# Patient Record
Sex: Male | Born: 1950
Health system: Southern US, Community
[De-identification: ages and names within clinical notes are randomized; demographics above are authoritative.]

## PROBLEM LIST (undated history)

## (undated) DIAGNOSIS — I10 Essential (primary) hypertension: Secondary | ICD-10-CM

## (undated) DIAGNOSIS — Z87442 Personal history of urinary calculi: Secondary | ICD-10-CM

## (undated) DIAGNOSIS — E119 Type 2 diabetes mellitus without complications: Secondary | ICD-10-CM

## (undated) DIAGNOSIS — K219 Gastro-esophageal reflux disease without esophagitis: Secondary | ICD-10-CM

## (undated) DIAGNOSIS — K8591 Acute pancreatitis with uninfected necrosis, unspecified: Secondary | ICD-10-CM

## (undated) DIAGNOSIS — B1920 Unspecified viral hepatitis C without hepatic coma: Secondary | ICD-10-CM

## (undated) HISTORY — DX: Acute pancreatitis with uninfected necrosis, unspecified: K85.91

## (undated) HISTORY — PX: BACK SURGERY: SHX140

---

## 1998-12-25 ENCOUNTER — Encounter: Payer: Self-pay | Admitting: Emergency Medicine

## 1998-12-25 ENCOUNTER — Emergency Department (HOSPITAL_COMMUNITY): Admission: EM | Admit: 1998-12-25 | Discharge: 1998-12-25 | Payer: Self-pay | Admitting: Emergency Medicine

## 1998-12-26 ENCOUNTER — Ambulatory Visit (HOSPITAL_COMMUNITY): Admission: RE | Admit: 1998-12-26 | Discharge: 1998-12-26 | Payer: Self-pay | Admitting: Pediatrics

## 2001-12-02 ENCOUNTER — Emergency Department (HOSPITAL_COMMUNITY): Admission: EM | Admit: 2001-12-02 | Discharge: 2001-12-02 | Payer: Self-pay

## 2016-11-02 DIAGNOSIS — R748 Abnormal levels of other serum enzymes: Secondary | ICD-10-CM | POA: Diagnosis not present

## 2016-11-02 DIAGNOSIS — D582 Other hemoglobinopathies: Secondary | ICD-10-CM | POA: Diagnosis not present

## 2016-11-02 DIAGNOSIS — F17210 Nicotine dependence, cigarettes, uncomplicated: Secondary | ICD-10-CM | POA: Diagnosis not present

## 2016-11-02 DIAGNOSIS — R7989 Other specified abnormal findings of blood chemistry: Secondary | ICD-10-CM | POA: Diagnosis not present

## 2016-11-02 DIAGNOSIS — R1030 Lower abdominal pain, unspecified: Secondary | ICD-10-CM | POA: Diagnosis not present

## 2016-11-02 DIAGNOSIS — R7309 Other abnormal glucose: Secondary | ICD-10-CM | POA: Diagnosis not present

## 2016-11-02 DIAGNOSIS — R197 Diarrhea, unspecified: Secondary | ICD-10-CM | POA: Diagnosis not present

## 2017-03-25 DIAGNOSIS — B3749 Other urogenital candidiasis: Secondary | ICD-10-CM | POA: Diagnosis not present

## 2017-03-25 DIAGNOSIS — B3742 Candidal balanitis: Secondary | ICD-10-CM | POA: Diagnosis not present

## 2017-03-25 DIAGNOSIS — R81 Glycosuria: Secondary | ICD-10-CM | POA: Diagnosis not present

## 2017-03-25 DIAGNOSIS — N481 Balanitis: Secondary | ICD-10-CM | POA: Diagnosis not present

## 2017-03-25 DIAGNOSIS — R7989 Other specified abnormal findings of blood chemistry: Secondary | ICD-10-CM | POA: Diagnosis not present

## 2017-04-02 DIAGNOSIS — E785 Hyperlipidemia, unspecified: Secondary | ICD-10-CM | POA: Diagnosis not present

## 2017-04-02 DIAGNOSIS — Z125 Encounter for screening for malignant neoplasm of prostate: Secondary | ICD-10-CM | POA: Diagnosis not present

## 2017-04-02 DIAGNOSIS — Z0001 Encounter for general adult medical examination with abnormal findings: Secondary | ICD-10-CM | POA: Diagnosis not present

## 2017-04-02 DIAGNOSIS — R739 Hyperglycemia, unspecified: Secondary | ICD-10-CM | POA: Diagnosis not present

## 2017-04-02 DIAGNOSIS — M545 Low back pain: Secondary | ICD-10-CM | POA: Diagnosis not present

## 2017-04-02 DIAGNOSIS — E109 Type 1 diabetes mellitus without complications: Secondary | ICD-10-CM | POA: Diagnosis not present

## 2017-04-16 DIAGNOSIS — E109 Type 1 diabetes mellitus without complications: Secondary | ICD-10-CM | POA: Diagnosis not present

## 2017-05-18 DIAGNOSIS — E785 Hyperlipidemia, unspecified: Secondary | ICD-10-CM | POA: Diagnosis not present

## 2017-05-18 DIAGNOSIS — E109 Type 1 diabetes mellitus without complications: Secondary | ICD-10-CM | POA: Diagnosis not present

## 2017-08-18 DIAGNOSIS — N200 Calculus of kidney: Secondary | ICD-10-CM | POA: Diagnosis not present

## 2017-08-18 DIAGNOSIS — E785 Hyperlipidemia, unspecified: Secondary | ICD-10-CM | POA: Diagnosis not present

## 2017-08-18 DIAGNOSIS — E109 Type 1 diabetes mellitus without complications: Secondary | ICD-10-CM | POA: Diagnosis not present

## 2017-11-16 DIAGNOSIS — G4700 Insomnia, unspecified: Secondary | ICD-10-CM | POA: Diagnosis not present

## 2017-11-16 DIAGNOSIS — E139 Other specified diabetes mellitus without complications: Secondary | ICD-10-CM | POA: Diagnosis not present

## 2018-01-20 DIAGNOSIS — Z720 Tobacco use: Secondary | ICD-10-CM

## 2018-01-20 DIAGNOSIS — R079 Chest pain, unspecified: Secondary | ICD-10-CM

## 2018-01-20 DIAGNOSIS — F17210 Nicotine dependence, cigarettes, uncomplicated: Secondary | ICD-10-CM | POA: Diagnosis not present

## 2018-01-20 DIAGNOSIS — E119 Type 2 diabetes mellitus without complications: Secondary | ICD-10-CM

## 2018-01-20 DIAGNOSIS — R0789 Other chest pain: Secondary | ICD-10-CM

## 2018-01-21 DIAGNOSIS — R0789 Other chest pain: Secondary | ICD-10-CM

## 2018-01-21 DIAGNOSIS — Z720 Tobacco use: Secondary | ICD-10-CM | POA: Diagnosis not present

## 2018-01-21 DIAGNOSIS — E119 Type 2 diabetes mellitus without complications: Secondary | ICD-10-CM | POA: Diagnosis not present

## 2018-01-21 DIAGNOSIS — R079 Chest pain, unspecified: Secondary | ICD-10-CM | POA: Diagnosis not present

## 2018-01-27 DIAGNOSIS — E785 Hyperlipidemia, unspecified: Secondary | ICD-10-CM | POA: Diagnosis not present

## 2018-01-27 DIAGNOSIS — R079 Chest pain, unspecified: Secondary | ICD-10-CM | POA: Diagnosis not present

## 2018-01-27 DIAGNOSIS — I10 Essential (primary) hypertension: Secondary | ICD-10-CM | POA: Diagnosis not present

## 2018-01-28 ENCOUNTER — Other Ambulatory Visit: Payer: Self-pay | Admitting: *Deleted

## 2018-01-28 NOTE — Patient Outreach (Signed)
[facility]) Care Management  01/28/2018  Waynetta PeanCharles Dean Panek [birth year] [id number]  Referral via Health Plan; member [facility] 01/21/[facility]:  PCP- [facility] "TOC will be completed by primary care provider office who [facility] if needed."  Plan:  Case closure.  Colleen CanLinda Tyerra Loretto, RN BSN CCM Care Management Coordinator [facility] Care Management  [phone]

## 2018-02-02 DIAGNOSIS — I10 Essential (primary) hypertension: Secondary | ICD-10-CM | POA: Diagnosis not present

## 2018-02-02 DIAGNOSIS — Z7982 Long term (current) use of aspirin: Secondary | ICD-10-CM | POA: Diagnosis not present

## 2018-02-02 DIAGNOSIS — E782 Mixed hyperlipidemia: Secondary | ICD-10-CM | POA: Diagnosis not present

## 2018-02-02 DIAGNOSIS — R079 Chest pain, unspecified: Secondary | ICD-10-CM | POA: Diagnosis not present

## 2018-02-03 DIAGNOSIS — R079 Chest pain, unspecified: Secondary | ICD-10-CM | POA: Diagnosis not present

## 2018-02-15 DIAGNOSIS — I10 Essential (primary) hypertension: Secondary | ICD-10-CM | POA: Diagnosis not present

## 2018-02-15 DIAGNOSIS — E139 Other specified diabetes mellitus without complications: Secondary | ICD-10-CM | POA: Diagnosis not present

## 2018-02-15 DIAGNOSIS — E785 Hyperlipidemia, unspecified: Secondary | ICD-10-CM | POA: Diagnosis not present

## 2018-03-16 ENCOUNTER — Encounter (HOSPITAL_COMMUNITY): Payer: Self-pay | Admitting: Emergency Medicine

## 2018-03-16 ENCOUNTER — Other Ambulatory Visit: Payer: Self-pay

## 2018-03-16 ENCOUNTER — Emergency Department (HOSPITAL_COMMUNITY): Payer: PPO

## 2018-03-16 ENCOUNTER — Inpatient Hospital Stay (HOSPITAL_COMMUNITY)
Admission: EM | Admit: 2018-03-16 | Discharge: 2018-03-26 | DRG: 438 | Disposition: A | Discharge status: Home Health | Payer: PPO | Attending: Internal Medicine | Admitting: Internal Medicine

## 2018-03-16 DIAGNOSIS — K8689 Other specified diseases of pancreas: Secondary | ICD-10-CM | POA: Diagnosis not present

## 2018-03-16 DIAGNOSIS — K8511 Biliary acute pancreatitis with uninfected necrosis: Secondary | ICD-10-CM | POA: Diagnosis not present

## 2018-03-16 DIAGNOSIS — D72829 Elevated white blood cell count, unspecified: Secondary | ICD-10-CM | POA: Diagnosis not present

## 2018-03-16 DIAGNOSIS — K859 Acute pancreatitis without necrosis or infection, unspecified: Secondary | ICD-10-CM | POA: Diagnosis not present

## 2018-03-16 DIAGNOSIS — B1920 Unspecified viral hepatitis C without hepatic coma: Secondary | ICD-10-CM | POA: Diagnosis not present

## 2018-03-16 DIAGNOSIS — R079 Chest pain, unspecified: Secondary | ICD-10-CM | POA: Diagnosis not present

## 2018-03-16 DIAGNOSIS — E785 Hyperlipidemia, unspecified: Secondary | ICD-10-CM | POA: Diagnosis present

## 2018-03-16 DIAGNOSIS — I959 Hypotension, unspecified: Secondary | ICD-10-CM | POA: Diagnosis not present

## 2018-03-16 DIAGNOSIS — R188 Other ascites: Secondary | ICD-10-CM | POA: Diagnosis present

## 2018-03-16 DIAGNOSIS — E119 Type 2 diabetes mellitus without complications: Secondary | ICD-10-CM | POA: Diagnosis present

## 2018-03-16 DIAGNOSIS — Z4659 Encounter for fitting and adjustment of other gastrointestinal appliance and device: Secondary | ICD-10-CM

## 2018-03-16 DIAGNOSIS — J810 Acute pulmonary edema: Secondary | ICD-10-CM | POA: Diagnosis not present

## 2018-03-16 DIAGNOSIS — R945 Abnormal results of liver function studies: Secondary | ICD-10-CM

## 2018-03-16 DIAGNOSIS — R197 Diarrhea, unspecified: Secondary | ICD-10-CM | POA: Diagnosis not present

## 2018-03-16 DIAGNOSIS — K861 Other chronic pancreatitis: Secondary | ICD-10-CM | POA: Diagnosis not present

## 2018-03-16 DIAGNOSIS — K851 Biliary acute pancreatitis without necrosis or infection: Secondary | ICD-10-CM | POA: Diagnosis not present

## 2018-03-16 DIAGNOSIS — E8770 Fluid overload, unspecified: Secondary | ICD-10-CM | POA: Diagnosis not present

## 2018-03-16 DIAGNOSIS — K8020 Calculus of gallbladder without cholecystitis without obstruction: Secondary | ICD-10-CM | POA: Diagnosis not present

## 2018-03-16 DIAGNOSIS — K838 Other specified diseases of biliary tract: Secondary | ICD-10-CM | POA: Diagnosis not present

## 2018-03-16 DIAGNOSIS — R066 Hiccough: Secondary | ICD-10-CM | POA: Diagnosis not present

## 2018-03-16 DIAGNOSIS — E876 Hypokalemia: Secondary | ICD-10-CM | POA: Diagnosis present

## 2018-03-16 DIAGNOSIS — R112 Nausea with vomiting, unspecified: Secondary | ICD-10-CM | POA: Diagnosis not present

## 2018-03-16 DIAGNOSIS — K567 Ileus, unspecified: Secondary | ICD-10-CM

## 2018-03-16 DIAGNOSIS — F17210 Nicotine dependence, cigarettes, uncomplicated: Secondary | ICD-10-CM | POA: Diagnosis not present

## 2018-03-16 DIAGNOSIS — I700 Atherosclerosis of aorta: Secondary | ICD-10-CM | POA: Diagnosis present

## 2018-03-16 DIAGNOSIS — N179 Acute kidney failure, unspecified: Secondary | ICD-10-CM | POA: Diagnosis present

## 2018-03-16 DIAGNOSIS — R6510 Systemic inflammatory response syndrome (SIRS) of non-infectious origin without acute organ dysfunction: Secondary | ICD-10-CM | POA: Diagnosis not present

## 2018-03-16 DIAGNOSIS — R0602 Shortness of breath: Secondary | ICD-10-CM

## 2018-03-16 DIAGNOSIS — R599 Enlarged lymph nodes, unspecified: Secondary | ICD-10-CM | POA: Diagnosis present

## 2018-03-16 DIAGNOSIS — K8512 Biliary acute pancreatitis with infected necrosis: Secondary | ICD-10-CM | POA: Diagnosis not present

## 2018-03-16 DIAGNOSIS — I10 Essential (primary) hypertension: Secondary | ICD-10-CM | POA: Diagnosis present

## 2018-03-16 DIAGNOSIS — K8050 Calculus of bile duct without cholangitis or cholecystitis without obstruction: Secondary | ICD-10-CM | POA: Diagnosis not present

## 2018-03-16 DIAGNOSIS — J90 Pleural effusion, not elsewhere classified: Secondary | ICD-10-CM | POA: Diagnosis present

## 2018-03-16 DIAGNOSIS — R0789 Other chest pain: Secondary | ICD-10-CM | POA: Diagnosis not present

## 2018-03-16 DIAGNOSIS — K6389 Other specified diseases of intestine: Secondary | ICD-10-CM | POA: Diagnosis not present

## 2018-03-16 DIAGNOSIS — Z79899 Other long term (current) drug therapy: Secondary | ICD-10-CM | POA: Diagnosis not present

## 2018-03-16 DIAGNOSIS — R109 Unspecified abdominal pain: Secondary | ICD-10-CM

## 2018-03-16 DIAGNOSIS — R7989 Other specified abnormal findings of blood chemistry: Secondary | ICD-10-CM | POA: Diagnosis present

## 2018-03-16 DIAGNOSIS — Z4682 Encounter for fitting and adjustment of non-vascular catheter: Secondary | ICD-10-CM | POA: Diagnosis not present

## 2018-03-16 DIAGNOSIS — R935 Abnormal findings on diagnostic imaging of other abdominal regions, including retroperitoneum: Secondary | ICD-10-CM | POA: Diagnosis not present

## 2018-03-16 DIAGNOSIS — E875 Hyperkalemia: Secondary | ICD-10-CM | POA: Diagnosis not present

## 2018-03-16 DIAGNOSIS — K8070 Calculus of gallbladder and bile duct without cholecystitis without obstruction: Secondary | ICD-10-CM | POA: Diagnosis present

## 2018-03-16 DIAGNOSIS — J984 Other disorders of lung: Secondary | ICD-10-CM | POA: Diagnosis not present

## 2018-03-16 DIAGNOSIS — R0902 Hypoxemia: Secondary | ICD-10-CM | POA: Diagnosis not present

## 2018-03-16 DIAGNOSIS — R140 Abdominal distension (gaseous): Secondary | ICD-10-CM | POA: Diagnosis not present

## 2018-03-16 DIAGNOSIS — B182 Chronic viral hepatitis C: Secondary | ICD-10-CM | POA: Diagnosis present

## 2018-03-16 HISTORY — DX: Unspecified viral hepatitis C without hepatic coma: B19.20

## 2018-03-16 HISTORY — DX: Essential (primary) hypertension: I10

## 2018-03-16 HISTORY — DX: Type 2 diabetes mellitus without complications: E11.9

## 2018-03-16 LAB — COMPREHENSIVE METABOLIC PANEL
ALK PHOS: 79 U/L (ref 38–126)
ALT: 197 U/L — ABNORMAL HIGH (ref 0–44)
ANION GAP: 14 (ref 5–15)
AST: 294 U/L — ABNORMAL HIGH (ref 15–41)
Albumin: 4 g/dL (ref 3.5–5.0)
BUN: 10 mg/dL (ref 8–23)
CALCIUM: 9.1 mg/dL (ref 8.9–10.3)
CO2: 26 mmol/L (ref 22–32)
CREATININE: 1.06 mg/dL (ref 0.61–1.24)
Chloride: 104 mmol/L (ref 98–111)
Glucose, Bld: 170 mg/dL — ABNORMAL HIGH (ref 70–99)
Potassium: 3.7 mmol/L (ref 3.5–5.1)
Sodium: 144 mmol/L (ref 135–145)
Total Bilirubin: 2.6 mg/dL — ABNORMAL HIGH (ref 0.3–1.2)
Total Protein: 6.6 g/dL (ref 6.5–8.1)

## 2018-03-16 LAB — CBC
HCT: 51.5 % (ref 39.0–52.0)
HEMOGLOBIN: 17.1 g/dL — AB (ref 13.0–17.0)
MCH: 30.9 pg (ref 26.0–34.0)
MCHC: 33.2 g/dL (ref 30.0–36.0)
MCV: 93.1 fL (ref 78.0–100.0)
PLATELETS: 212 10*3/uL (ref 150–400)
RBC: 5.53 MIL/uL (ref 4.22–5.81)
RDW: 13.3 % (ref 11.5–15.5)
WBC: 15.2 10*3/uL — AB (ref 4.0–10.5)

## 2018-03-16 LAB — LACTATE DEHYDROGENASE: LDH: 242 U/L — AB (ref 98–192)

## 2018-03-16 LAB — TROPONIN I

## 2018-03-16 LAB — GLUCOSE, CAPILLARY
GLUCOSE-CAPILLARY: 249 mg/dL — AB (ref 70–99)
GLUCOSE-CAPILLARY: 212 mg/dL — AB (ref 70–99)
Glucose-Capillary: 227 mg/dL — ABNORMAL HIGH (ref 70–99)
Glucose-Capillary: 199 mg/dL — ABNORMAL HIGH (ref 70–99)

## 2018-03-16 LAB — LIPASE, BLOOD: Lipase: 8026 U/L — ABNORMAL HIGH (ref 11–51)

## 2018-03-16 MED ORDER — LACTATED RINGERS IV BOLUS
500.0000 mL | INTRAVENOUS | Status: AC
  Administered 2018-03-16: 500 mL via INTRAVENOUS

## 2018-03-16 MED ORDER — ACETAMINOPHEN 650 MG RE SUPP: 650.0000 mg | Freq: Four times a day (QID) | RECTAL | Status: DC | PRN

## 2018-03-16 MED ORDER — METOPROLOL TARTRATE 5 MG/5ML IV SOLN
2.5000 mg | Freq: Three times a day (TID) | INTRAVENOUS | Status: DC
  Administered 2018-03-16 – 2018-03-20 (×13): 2.5 mg via INTRAVENOUS
  Filled 2018-03-16 (×13): qty 5

## 2018-03-16 MED ORDER — HYDROMORPHONE 1 MG/ML IV SOLN
INTRAVENOUS | Status: DC
  Administered 2018-03-16: 25 mg via INTRAVENOUS
  Administered 2018-03-16: 0.9 mg via INTRAVENOUS
  Administered 2018-03-16: 2.7 mg via INTRAVENOUS
  Administered 2018-03-16: 3.3 mg via INTRAVENOUS
  Administered 2018-03-17 (×2): 2.1 mg via INTRAVENOUS
  Administered 2018-03-17: 0.6 mg via INTRAVENOUS
  Administered 2018-03-17 (×2): 0.3 mg via INTRAVENOUS
  Administered 2018-03-17 – 2018-03-18 (×2): 1.2 mg via INTRAVENOUS
  Administered 2018-03-18: 1.5 mg via INTRAVENOUS
  Administered 2018-03-18: 0.9 mg via INTRAVENOUS
  Administered 2018-03-18: 0 mg via INTRAVENOUS
  Administered 2018-03-18: 23:00:00 via INTRAVENOUS
  Administered 2018-03-19: 0.6 mg via INTRAVENOUS
  Administered 2018-03-20: 12.9 mg via INTRAVENOUS
  Filled 2018-03-16 (×2): qty 25

## 2018-03-16 MED ORDER — HYDROMORPHONE HCL 1 MG/ML IJ SOLN
1.0000 mg | INTRAMUSCULAR | Status: DC | PRN
  Administered 2018-03-16: 1 mg via INTRAVENOUS
  Filled 2018-03-16: qty 1

## 2018-03-16 MED ORDER — HYDRALAZINE HCL 20 MG/ML IJ SOLN
5.0000 mg | INTRAMUSCULAR | Status: DC | PRN
  Administered 2018-03-20: 5 mg via INTRAVENOUS
  Filled 2018-03-16: qty 1

## 2018-03-16 MED ORDER — IOHEXOL 300 MG/ML  SOLN
100.0000 mL | Freq: Once | INTRAMUSCULAR | Status: AC | PRN
  Administered 2018-03-16: 100 mL via INTRAVENOUS

## 2018-03-16 MED ORDER — ONDANSETRON HCL 4 MG/2ML IJ SOLN
4.0000 mg | Freq: Once | INTRAMUSCULAR | Status: AC
  Administered 2018-03-16: 4 mg via INTRAVENOUS
  Filled 2018-03-16: qty 2

## 2018-03-16 MED ORDER — DIPHENHYDRAMINE HCL 12.5 MG/5ML PO ELIX
12.5000 mg | ORAL_SOLUTION | Freq: Four times a day (QID) | ORAL | Status: DC | PRN
  Filled 2018-03-16: qty 5

## 2018-03-16 MED ORDER — INSULIN ASPART 100 UNIT/ML ~~LOC~~ SOLN
0.0000 [IU] | Freq: Every day | SUBCUTANEOUS | Status: DC
  Administered 2018-03-22: 2 [IU] via SUBCUTANEOUS
  Administered 2018-03-24: 3 [IU] via SUBCUTANEOUS
  Administered 2018-03-25: 2 [IU] via SUBCUTANEOUS

## 2018-03-16 MED ORDER — LACTATED RINGERS IV SOLN
INTRAVENOUS | Status: DC
  Administered 2018-03-16 – 2018-03-22 (×13): via INTRAVENOUS

## 2018-03-16 MED ORDER — HYDROMORPHONE HCL 1 MG/ML IJ SOLN
1.0000 mg | Freq: Once | INTRAMUSCULAR | Status: AC
  Administered 2018-03-16: 1 mg via INTRAVENOUS
  Filled 2018-03-16: qty 1

## 2018-03-16 MED ORDER — SODIUM CHLORIDE 0.9 % IV BOLUS (SEPSIS)
1000.0000 mL | Freq: Once | INTRAVENOUS | Status: AC
Start: 2018-03-16 — End: 2018-03-16
  Administered 2018-03-16: 1000 mL via INTRAVENOUS

## 2018-03-16 MED ORDER — NICOTINE 21 MG/24HR TD PT24
21.0000 mg | MEDICATED_PATCH | Freq: Every day | TRANSDERMAL | Status: DC
  Administered 2018-03-16 – 2018-03-26 (×10): 21 mg via TRANSDERMAL
  Filled 2018-03-16 (×11): qty 1

## 2018-03-16 MED ORDER — NALOXONE HCL 0.4 MG/ML IJ SOLN: 0.4000 mg | INTRAMUSCULAR | Status: DC | PRN

## 2018-03-16 MED ORDER — ENOXAPARIN SODIUM 40 MG/0.4ML ~~LOC~~ SOLN
40.0000 mg | SUBCUTANEOUS | Status: DC
  Administered 2018-03-16 – 2018-03-26 (×11): 40 mg via SUBCUTANEOUS
  Filled 2018-03-16 (×11): qty 0.4

## 2018-03-16 MED ORDER — ONDANSETRON HCL 4 MG PO TABS: 4.0000 mg | ORAL_TABLET | Freq: Four times a day (QID) | ORAL | Status: DC | PRN

## 2018-03-16 MED ORDER — ONDANSETRON HCL 4 MG/2ML IJ SOLN
4.0000 mg | Freq: Four times a day (QID) | INTRAMUSCULAR | Status: DC | PRN
  Administered 2018-03-16 (×2): 4 mg via INTRAVENOUS
  Filled 2018-03-16 (×3): qty 2

## 2018-03-16 MED ORDER — INSULIN ASPART 100 UNIT/ML ~~LOC~~ SOLN
0.0000 [IU] | Freq: Three times a day (TID) | SUBCUTANEOUS | Status: DC
  Administered 2018-03-16 (×2): 5 [IU] via SUBCUTANEOUS
  Administered 2018-03-17 (×3): 3 [IU] via SUBCUTANEOUS
  Administered 2018-03-18: 2 [IU] via SUBCUTANEOUS
  Administered 2018-03-18 (×2): 3 [IU] via SUBCUTANEOUS
  Administered 2018-03-19 (×3): 2 [IU] via SUBCUTANEOUS
  Administered 2018-03-20 (×2): 3 [IU] via SUBCUTANEOUS
  Administered 2018-03-20: 2 [IU] via SUBCUTANEOUS
  Administered 2018-03-21: 3 [IU] via SUBCUTANEOUS
  Administered 2018-03-21: 5 [IU] via SUBCUTANEOUS
  Administered 2018-03-21 – 2018-03-22 (×3): 3 [IU] via SUBCUTANEOUS
  Administered 2018-03-22: 5 [IU] via SUBCUTANEOUS
  Administered 2018-03-23: 3 [IU] via SUBCUTANEOUS
  Administered 2018-03-23: 8 [IU] via SUBCUTANEOUS
  Administered 2018-03-24: 2 [IU] via SUBCUTANEOUS
  Administered 2018-03-24: 8 [IU] via SUBCUTANEOUS
  Administered 2018-03-24: 2 [IU] via SUBCUTANEOUS
  Administered 2018-03-25: 8 [IU] via SUBCUTANEOUS
  Administered 2018-03-25: 3 [IU] via SUBCUTANEOUS
  Administered 2018-03-25: 11 [IU] via SUBCUTANEOUS
  Administered 2018-03-26: 8 [IU] via SUBCUTANEOUS

## 2018-03-16 MED ORDER — LACTATED RINGERS IV BOLUS
1000.0000 mL | Freq: Once | INTRAVENOUS | Status: AC
  Administered 2018-03-16: 1000 mL via INTRAVENOUS

## 2018-03-16 MED ORDER — FENTANYL CITRATE (PF) 100 MCG/2ML IJ SOLN: 50.0000 ug | Freq: Once | INTRAMUSCULAR | Status: DC

## 2018-03-16 MED ORDER — SODIUM CHLORIDE 0.9% FLUSH: 9.0000 mL | INTRAVENOUS | Status: DC | PRN

## 2018-03-16 MED ORDER — DIPHENHYDRAMINE HCL 50 MG/ML IJ SOLN: 12.5000 mg | Freq: Four times a day (QID) | INTRAMUSCULAR | Status: DC | PRN

## 2018-03-16 MED ORDER — ACETAMINOPHEN 325 MG PO TABS: 650.0000 mg | ORAL_TABLET | Freq: Four times a day (QID) | ORAL | Status: DC | PRN

## 2018-03-16 NOTE — ED Notes (Signed)
Provider bedside, pain has returned.  Ordered medications

## 2018-03-16 NOTE — H&P (Signed)
History and Physical    Ryan Chan WGN:[id number] DOB: [birth year] DOA: 03/16/2018  PCP: [facility] in [place] Consultants:  None Patient coming from:  Home - lives with wife; Jackey Loge: Wife, [phone]  Chief Complaint: abdominal pain  HPI: Ryan Chan is a 67 y.o. male with medical history significant of DM; HTN; and Hepatitis C presenting with "A lot of pain".  The pain was midepigastric in nature.  He was sleeping and it woke him up about 130.  + N/V.  Emesis x 3.  No blood.  Similar issues but not as bad intermittently in the past.  He does still have a gallbladder.  No fevers.  So far his pain is not controlled with Dilaudid q1h.   ED Course: Gallstone pancreatitis.  Lipase >8000.  Dr. Leone Payor recommends IVF and pain control and GI will see later today to determine when to perform ERCP.  Review of Systems: As per HPI; otherwise review of systems reviewed and negative.   Ambulatory Status:  Ambulates without assistance  Past Medical History:  Diagnosis Date  . Diabetes mellitus without complication (HCC)   . Essential hypertension   . Hepatitis C     Past Surgical History:  Procedure Laterality Date  . BACK SURGERY      Social History   Socioeconomic History  . Marital status: Married    Spouse name: Not on file  . Number of children: Not on file  . Years of education: Not on file  . Highest education level: Not on file  Occupational History  . Occupation: Ecologist work  Social Needs  . Financial resource strain: Not on file  . Food insecurity:    Worry: Not on file    Inability: Not on file  . Transportation needs:    Medical: Not on file    Non-medical: Not on file  Tobacco Use  . Smoking status: Current Every Day Smoker    Packs/day: 2.00    Years: 54.00    Pack years: 108.00    Types: Cigarettes  . Smokeless tobacco: Never Used  Substance and Sexual Activity  . Alcohol use: Not Currently  . Drug use: Not Currently  .  Sexual activity: Not on file  Lifestyle  . Physical activity:    Days per week: Not on file    Minutes per session: Not on file  . Stress: Not on file  Relationships  . Social connections:    Talks on phone: Not on file    Gets together: Not on file    Attends religious service: Not on file    Active member of club or organization: Not on file    Attends meetings of clubs or organizations: Not on file    Relationship status: Not on file  . Intimate partner violence:    Fear of current or ex partner: Not on file    Emotionally abused: Not on file    Physically abused: Not on file    Forced sexual activity: Not on file  Other Topics Concern  . Not on file  Social History Narrative  . Not on file    No Known Allergies  Family History  Problem Relation Age of Onset  . GI Disease Neg Hx     Prior to Admission medications   Medication Sig Start Date End Date Taking? Authorizing Provider  atorvastatin (LIPITOR) 20 MG tablet Take 20 mg by mouth every evening. 02/16/18  Yes [provider]  lisinopril (PRINIVIL,ZESTRIL) 5  MG tablet Take 5 mg by mouth daily. 02/18/18  Yes [provider]  metoprolol succinate (TOPROL-XL) 25 MG 24 hr tablet Take 25 mg by mouth daily. 02/02/18  Yes [provider]  nitroGLYCERIN (NITROSTAT) 0.4 MG SL tablet Place 0.4 mg under the tongue every 5 (five) minutes as needed for chest pain.  01/21/18  Yes [provider]    Physical Exam: Vitals:   03/16/18 0545 03/16/18 0630 03/16/18 0715 03/16/18 0815  BP: (!) 206/97 (!) 187/95 (!) 188/98 (!) 184/96  Pulse: 88 92 89 96  Resp: (!) 24 15 18 17   Temp:      TempSrc:      SpO2: 91% 91% 99% 98%  Weight:      Height:         General: Appears very uncomfortable, lying very still and holding his midepigastric region. Eyes:  PERRL, EOMI, normal lids, iris ENT:  grossly normal hearing, lips & tongue, mmm; moderately poor dentition Neck:  no LAD, masses or  thyromegaly Cardiovascular:  RRR, no m/r/g. No LE edema.  Respiratory:   CTA bilaterally with no wheezes/rales/rhonchi.  Normal respiratory effort. Abdomen:  Hypoactive bowel sounds, very TTP diffusely but especially in the midepigastric region Skin:  no rash or induration seen on limited exam Musculoskeletal:  grossly normal tone BUE/BLE, good ROM, no bony abnormality Lower extremity:  No LE edema.  Limited foot exam with no ulcerations.  2+ distal pulses. Psychiatric: blunted mood and affect, speech fluent and appropriate, AOx3 Neurologic:  CN 2-12 grossly intact, moves all extremities in coordinated fashion, sensation intact    Radiological Exams on Admission: Ct Abdomen Pelvis W Contrast  Result Date: 03/16/2018 CLINICAL DATA:  67 y/o  M; epigastric pain. EXAM: CT ABDOMEN AND PELVIS WITH CONTRAST TECHNIQUE: Multidetector CT imaging of the abdomen and pelvis was performed using the standard protocol following bolus administration of intravenous contrast. CONTRAST:  100 cc Omnipaque 300 COMPARISON:  01/25/2008 CT of the abdomen and pelvis. FINDINGS: Lower chest: No acute abnormality. Hepatobiliary: Mild intra and extrahepatic biliary ductal dilatation with the common bile duct measuring up to 11 mm. No focal liver lesion. Cholelithiasis. Pancreas: Severe extensive edema surrounding the pancreas compatible with acute pancreatitis. No discrete acute peripancreatic collection. Spleen: Normal in size without focal abnormality. Adrenals/Urinary Tract: Adrenal glands are unremarkable. Kidneys are normal, without renal calculi, focal lesion, or hydronephrosis. Bladder is unremarkable. Stomach/Bowel: Stomach is within normal limits. Mild wall thickening of the duodenum, likely reactive inflammatory changes. Appendix appears normal. No evidence of bowel wall thickening, distention, or inflammatory changes of the jejunum, ileum, or colon. Vascular/Lymphatic: Severe mixed atherosclerosis of the aorta.  Bilateral common iliac and internal iliac artery aneurysms. The right common iliac artery aneurysm measures 2 cm and left 1.7 cm. No lymphadenopathy. Reproductive: Prostate is unremarkable. Other: No abdominal wall hernia or abnormality. No abdominopelvic ascites. Musculoskeletal: No fracture is seen. Moderate degenerative changes of the lumbar spine. IMPRESSION: 1. Acute pancreatitis with extensive retroperitoneal edema. No discrete acute peripancreatic collection identified at this time. Mild intra and extrahepatic biliary ductal dilatation with cholelithiasis may represent choledocholithiasis and gallstone pancreatitis. This can be further characterized with MRI/MRCP of the abdomen. 2. Severe aortic atherosclerosis with bilateral common iliac artery and internal iliac artery aneurysms. Electronically Signed   By: Mitzi HansenLance  Furusawa-Stratton M.D.   On: 03/16/2018 05:49    EKG: Independently reviewed.  NSR with rate 61; nonspecific ST changes with no evidence of acute ischemia   Labs on Admission: I  have personally reviewed the available labs and imaging studies at the time of the admission.  Pertinent labs:   Glucose 170 Lipase 8026 AST 294/ALT 197/Bili 2.6; normal in 7/19 Troponin <0.03 WBC 15.2 A1c 5.8 on 02/15/18 (consistently down from 11.2 on 04/02/17)   Assessment/Plan Principal Problem:   Gallstone pancreatitis Active Problems:   Elevated LFTs   Hepatitis C   Essential hypertension   Diet-controlled diabetes mellitus (HCC)   Gallstone pancreatitis -Patient without prior h/o pancreatitis presenting with acute onset of severe midepigastric abdominal pain that awoke him from sleep. -He previously has had similar but much milder symptoms intermittently. -Now with frank pancreatitis by H&P, elevated lipase, elevated LFTs -His LDH is pending, but assuming it is <300, his score is 2 with a mortality risk of 0.9%. -Will admit, as he is likely to remain in the hospital for several days to  complete his evaluation and treatment and allow the pancreatitis time to improve and to allow the inflammation to cool prior to surgery/ERCP -Strict NPO for now -Aggressive IVF hydration at least for the first 12 hours with LR at 200 cc/hr -Pain control with Dilaudid PCA - he did not receive adequate pain relief with 1 mg IV q1h prn in the ER. -Nausea control with Zofran -Gallstones - he has a gallbladder and has stones on CT; this is very likely the cause.  He will need a GI consult for probable ERCP; this may also need to be delayed for one to several days. -Surgery will need to be consulted; it seems likely that they will wait at least a couple of days before attempting cholecystectomy to allow the LFTs to improve and so they have not been consulted at this time. -Based on pre-morbid condition, would anticipate generally good surgical outcome and discharge over the weekend.  Elevated LFTs -As above, likely related to gallstone pancreatitis. -Will recheck LFTs in the AM  Hep C -He is not on medications for this condition. -LFTs were normal in 7/19 and so this does not appear to be the cause for his transaminitis.  DM -He is not on medications at this time and his A1c shows good control. -His glucose is elevated at this time, which may be a stress response. -Will order moderate-scale SSI, as his glucose may worsen due to pancreatic irritation.  HTN -Hold PO medications (lisinopril, metoprolol) -Will start IV Lopressor 2.5 mg q8h -Will add prn IV hydralazine for SBP <180   DVT prophylaxis:  Lovenox  Code Status: Full - confirmed with patient/family Family Communication: Wife present throughout evaluation Disposition Plan:  Home once clinically improved Consults called: GI  Admission status: Admit - It is my clinical opinion that admission to INPATIENT is reasonable and necessary because of the expectation that this patient will require hospital care that crosses at least 2 midnights  to treat this condition based on the medical complexity of the problems presented.  Given the aforementioned information, the predictability of an adverse outcome is felt to be significant.     Ryan BlueJennifer Fraya Ueda MD [facility]  If note is complete, please contact covering daytime or nighttime physician. [email or web address] Password [email or web address]  03/16/2018, 9:08 AM

## 2018-03-16 NOTE — ED Notes (Signed)
Pt sleeping, O2 drops to 80's, placed on 2L [place], O2 levels returned to WNL.  Pt states it "hurts to breath."

## 2018-03-16 NOTE — ED Provider Notes (Signed)
[facility] EMERGENCY DEPARTMENT Provider Note   CSN: [id number] Arrival date & time: 03/16/18  0256     History   Chief Complaint Chief Complaint  Patient presents with  . Abdominal Pain  . Emesis    HPI Ryan Chan is a 67 y.o. male.  The history is provided by the patient and the spouse.  Abdominal Pain   This is a new problem. The current episode started less than 1 hour ago. The problem occurs constantly. The problem has been rapidly worsening. The pain is located in the epigastric region. The pain is severe. Associated symptoms include nausea and vomiting. Pertinent negatives include fever and constipation. The symptoms are aggravated by palpation. Nothing relieves the symptoms.  Emesis   Associated symptoms include abdominal pain. Pertinent negatives include no fever.  Patient reports he woke up with severe epigastric pain.  He reports associated nausea and vomiting.  He reports he has had epigastric pain previously, was seen at [facility] for this but reports this episode is worse.  Past Medical History:  Diagnosis Date  . Diabetes mellitus without complication (HCC)     There are no active problems to display for this patient.   Past Surgical History:  Procedure Laterality Date  . BACK SURGERY          Home Medications    Prior to Admission medications   Medication Sig Start Date End Date Taking? Authorizing Provider  atorvastatin (LIPITOR) 20 MG tablet Take 20 mg by mouth every evening. 02/16/18  Yes [provider]  lisinopril (PRINIVIL,ZESTRIL) 5 MG tablet Take 5 mg by mouth daily. 02/18/18  Yes [provider]  metoprolol succinate (TOPROL-XL) 25 MG 24 hr tablet Take 25 mg by mouth daily. 02/02/18  Yes [provider]  nitroGLYCERIN (NITROSTAT) 0.4 MG SL tablet Place 0.4 mg under the tongue every 5 (five) minutes as needed for chest pain.  01/21/18  Yes [provider]    Family  History No family history on file.  Social History Social History   Tobacco Use  . Smoking status: Current Every Day Smoker    Packs/day: 2.00    Types: Cigarettes  . Smokeless tobacco: Never Used  Substance Use Topics  . Alcohol use: Not Currently  . Drug use: Not Currently     Allergies   Patient has no known allergies.   Review of Systems Review of Systems  Constitutional: Negative for fever.  Respiratory: Negative for shortness of breath.   Gastrointestinal: Positive for abdominal pain, nausea and vomiting. Negative for constipation.  All other systems reviewed and are negative.    Physical Exam Updated Vital Signs BP (!) 175/69   Pulse 72   Temp (!) 97.5 F (36.4 C) (Oral)   Resp 12   Ht 1.651 m (5\' 5" )   Wt 76.2 kg   SpO2 94%   BMI 27.96 kg/m   Physical Exam CONSTITUTIONAL: Elderly, anxious, ill-appearing HEAD: Normocephalic/atraumatic EYES: EOMI/PERRL ENMT: Mucous membranes dry NECK: supple no meningeal signs SPINE/BACK:entire spine nontender CV: S1/S2 noted, no murmurs/rubs/gallops noted LUNGS: Lungs are clear to auscultation bilaterally, no apparent distress ABDOMEN: soft, significant diffuse abdominal tenderness with guarding GU:no cva tenderness NEURO: Pt is awake/alert/appropriate, moves all extremitiesx4.  No facial droop.   EXTREMITIES: pulses normal/equal, full ROM SKIN: warm, color normal PSYCH: Anxious ED Treatments / Results  Labs (all labs ordered are listed, but only abnormal results are displayed) Labs Reviewed  COMPREHENSIVE METABOLIC PANEL - Abnormal; Notable  for the following components:      Result Value   Glucose, Bld 170 (*)    AST 294 (*)    ALT 197 (*)    Total Bilirubin 2.6 (*)    All other components within normal limits  CBC - Abnormal; Notable for the following components:   WBC 15.2 (*)    Hemoglobin 17.1 (*)    All other components within normal limits  TROPONIN I  LIPASE, BLOOD    EKG EKG  Interpretation  Date/Time:  Tuesday March 16 2018 02:57:36 EDT Ventricular Rate:  61 PR Interval:    QRS Duration: 110 QT Interval:  406 QTC Calculation: 406 R Axis:   -39 Text Interpretation:  Sinus rhythm Incomplete left bundle branch block Low voltage, precordial leads Consider inferior infarct Interpretation limited secondary to artifact Confirmed by Zadie RhineWickline, Johnatan Baskette ([id number]) on 03/16/2018 3:13:18 AM   Radiology Ct Abdomen Pelvis W Contrast  Result Date: 03/16/2018 CLINICAL DATA:  67 y/o  M; epigastric pain. EXAM: CT ABDOMEN AND PELVIS WITH CONTRAST TECHNIQUE: Multidetector CT imaging of the abdomen and pelvis was performed using the standard protocol following bolus administration of intravenous contrast. CONTRAST:  100 cc Omnipaque 300 COMPARISON:  01/25/2008 CT of the abdomen and pelvis. FINDINGS: Lower chest: No acute abnormality. Hepatobiliary: Mild intra and extrahepatic biliary ductal dilatation with the common bile duct measuring up to 11 mm. No focal liver lesion. Cholelithiasis. Pancreas: Severe extensive edema surrounding the pancreas compatible with acute pancreatitis. No discrete acute peripancreatic collection. Spleen: Normal in size without focal abnormality. Adrenals/Urinary Tract: Adrenal glands are unremarkable. Kidneys are normal, without renal calculi, focal lesion, or hydronephrosis. Bladder is unremarkable. Stomach/Bowel: Stomach is within normal limits. Mild wall thickening of the duodenum, likely reactive inflammatory changes. Appendix appears normal. No evidence of bowel wall thickening, distention, or inflammatory changes of the jejunum, ileum, or colon. Vascular/Lymphatic: Severe mixed atherosclerosis of the aorta. Bilateral common iliac and internal iliac artery aneurysms. The right common iliac artery aneurysm measures 2 cm and left 1.7 cm. No lymphadenopathy. Reproductive: Prostate is unremarkable. Other: No abdominal wall hernia or abnormality. No abdominopelvic  ascites. Musculoskeletal: No fracture is seen. Moderate degenerative changes of the lumbar spine. IMPRESSION: 1. Acute pancreatitis with extensive retroperitoneal edema. No discrete acute peripancreatic collection identified at this time. Mild intra and extrahepatic biliary ductal dilatation with cholelithiasis may represent choledocholithiasis and gallstone pancreatitis. This can be further characterized with MRI/MRCP of the abdomen. 2. Severe aortic atherosclerosis with bilateral common iliac artery and internal iliac artery aneurysms. Electronically Signed   By: Mitzi HansenLance  Furusawa-Stratton M.D.   On: 03/16/2018 05:49    Procedures Procedures   Medications Ordered in ED Medications  lactated ringers bolus 1,000 mL (1,000 mLs Intravenous New Bag/Given 03/16/18 0628)  HYDROmorphone (DILAUDID) injection 1 mg (has no administration in time range)  ondansetron (ZOFRAN) injection 4 mg (4 mg Intravenous Given 03/16/18 0342)  HYDROmorphone (DILAUDID) injection 1 mg (1 mg Intravenous Given 03/16/18 0342)  HYDROmorphone (DILAUDID) injection 1 mg (1 mg Intravenous Given 03/16/18 0425)  iohexol (OMNIPAQUE) 300 MG/ML solution 100 mL (100 mLs Intravenous Contrast Given 03/16/18 0510)  sodium chloride 0.9 % bolus 1,000 mL (1,000 mLs Intravenous New Bag/Given 03/16/18 0619)  HYDROmorphone (DILAUDID) injection 1 mg (1 mg Intravenous Given 03/16/18 0623)  ondansetron (ZOFRAN) injection 4 mg (4 mg Intravenous Given 03/16/18 0620)     Initial Impression / Assessment and Plan / ED Course  I have reviewed the triage vital signs and the nursing notes.  Pertinent labs & imaging results that were available during my care of the patient were reviewed by me and considered in my medical decision making (see chart for details).     6:29 AM Patient presented with significant diffuse abdominal tenderness.  He was very ill-appearing on arrival. Labs show elevation in LFTs as well as total bilirubin.  Lipase pending, but CT  imaging reveals pancreatitis likely due to gallstone. Discussed the case with Dr. Leone PayorGessner with gastroenterology, plan to give IV fluids, IV pain medicines, admit to medical service. He will likely require an ERCP admission, then later will need surgical consultation 7:27 AM Discussed with Dr. Ophelia CharterYates with [facility] for admission  Final Clinical Impressions(s) / ED Diagnoses   Final diagnoses:  Acute gallstone pancreatitis    ED Discharge Orders    None       Zadie RhineWickline, Jenetta Wease, MD 03/16/18 [phone]

## 2018-03-16 NOTE — ED Triage Notes (Signed)
Pt arrives via EMS with epigastric pain that woke him from sleep, pt took 1 SL nitro and called 911. EMS administered 4MG  zofran for vomiting and 324MG  aspirin. Pain worse upon palpation of abdomen and any movement.

## 2018-03-16 NOTE — ED Notes (Signed)
ED Provider at bedside. 

## 2018-03-16 NOTE — Consult Note (Addendum)
Consultation  Referring Provider:  [facility] hospitalist/ Yates MD Primary Care Physician:  System, Pcp Not In Primary Gastroenterologist:  None/unassigned  Reason for Consultation:   Gallstone pancreatitis  HPI: Ryan Chan is a 67 y.o. male from [place] admitted admitted early this a.m. through the emergency room after acute onset of severe epigastric pain awakening him from sleep at about 1:30 AM.  This was associated with nausea and vomiting.  Patient had severe unrelenting pain and presented to the emergency room.  Hemodynamically stable on arrival. Labs revealed lipase of 8000, WBC 15.2, hemoglobin 17, T bili of 2.6 alk phos 79 AST of 294 and ALT of 197.   CT of the abdomen and pelvis was done with contrast showing severe extensive edema surrounding the pancreas consistent with acute pancreatitis, no fluid collection noted, there is mild intra-and extrahepatic ductal dilation with CBD of 11 mm, and gallstones noted in the gallbladder.,  Also had some mild duodenal wall thickening and evidence of severe atherosclerosis bilateral common iliacs.  Patient received IV fluid bolus does not cc in the ER and is on lactated Ringer's at 200/h  Pain was refractory to Dilaudid, is now on a PCA.  Patient denies any other similar milder episodes of pain.  He says he has had some acid reflux at night but no epigastric pain. Other medical problems include adult onset diabetes mellitus, hypertension, hyperlipidemia for which he is on Lipitor, tobacco abuse, and hepatitis C for which he has not been treated.  PCA is helping but he still having significant pain.  Wife at bedside     Past Medical History:  Diagnosis Date  . Diabetes mellitus without complication ([place])   . Essential hypertension   . Hepatitis C     Past Surgical History:  Procedure Laterality Date  . BACK SURGERY      Prior to Admission medications   Medication Sig Start Date End Date Taking? Authorizing Provider    atorvastatin (LIPITOR) 20 MG tablet Take 20 mg by mouth every evening. 02/16/18  Yes [provider]  lisinopril (PRINIVIL,ZESTRIL) 5 MG tablet Take 5 mg by mouth daily. 02/18/18  Yes [provider]  metoprolol succinate (TOPROL-XL) 25 MG 24 hr tablet Take 25 mg by mouth daily. 02/02/18  Yes [provider]  nitroGLYCERIN (NITROSTAT) 0.4 MG SL tablet Place 0.4 mg under the tongue every 5 (five) minutes as needed for chest pain.  01/21/18  Yes [provider]    Current Facility-Administered Medications  Medication Dose Route Frequency Provider Last Rate Last Dose  . acetaminophen (TYLENOL) tablet 650 mg  650 mg Oral Q6H PRN Karmen Bongo, MD       Or  . acetaminophen (TYLENOL) suppository 650 mg  650 mg Rectal Q6H PRN Karmen Bongo, MD      . diphenhydrAMINE (BENADRYL) injection 12.5 mg  12.5 mg Intravenous Q6H PRN Karmen Bongo, MD       Or  . diphenhydrAMINE (BENADRYL) 12.5 MG/5ML elixir 12.5 mg  12.5 mg Oral Q6H PRN Karmen Bongo, MD      . enoxaparin (LOVENOX) injection 40 mg  40 mg Subcutaneous Q24H Karmen Bongo, MD      . hydrALAZINE (APRESOLINE) injection 5 mg  5 mg Intravenous Q4H PRN Karmen Bongo, MD      . HYDROmorphone (DILAUDID) 1 mg/mL PCA injection   Intravenous Q4H Karmen Bongo, MD   25 mg at 03/16/18 9977  . insulin aspart (novoLOG) injection 0-15 Units  0-15 Units  Subcutaneous TID WC Karmen Bongo, MD      . insulin aspart (novoLOG) injection 0-5 Units  0-5 Units Subcutaneous QHS Karmen Bongo, MD      . lactated ringers infusion   Intravenous Continuous Karmen Bongo, MD 200 mL/hr at 03/16/18 [phone]    . metoprolol tartrate (LOPRESSOR) injection 2.5 mg  2.5 mg Intravenous Lynne Logan, MD      . naloxone [facility]) injection 0.4 mg  0.4 mg Intravenous PRN Karmen Bongo, MD       And  . sodium chloride flush (NS) 0.9 % injection 9 mL  9 mL Intravenous PRN Karmen Bongo, MD      . nicotine (NICODERM CQ - dosed in  mg/24 hours) patch 21 mg  21 mg Transdermal Daily Karmen Bongo, MD      . ondansetron [facility]) tablet 4 mg  4 mg Oral Q6H PRN Karmen Bongo, MD       Or  . ondansetron [facility]) injection 4 mg  4 mg Intravenous Q6H PRN Karmen Bongo, MD        Allergies as of 03/16/2018  . (No Known Allergies)    Family History  Problem Relation Age of Onset  . GI Disease Neg Hx     Social History   Socioeconomic History  . Marital status: Married    Spouse name: Not on file  . Number of children: Not on file  . Years of education: Not on file  . Highest education level: Not on file  Occupational History  . Occupation: Armed forces operational officer work  Social Needs  . Financial resource strain: Not on file  . Food insecurity:    Worry: Not on file    Inability: Not on file  . Transportation needs:    Medical: Not on file    Non-medical: Not on file  Tobacco Use  . Smoking status: Current Every Day Smoker    Packs/day: 2.00    Years: 54.00    Pack years: 108.00    Types: Cigarettes  . Smokeless tobacco: Never Used  Substance and Sexual Activity  . Alcohol use: Not Currently  . Drug use: Not Currently  . Sexual activity: Not on file  Lifestyle  . Physical activity:    Days per week: Not on file    Minutes per session: Not on file  . Stress: Not on file  Relationships  . Social connections:    Talks on phone: Not on file    Gets together: Not on file    Attends religious service: Not on file    Active member of club or organization: Not on file    Attends meetings of clubs or organizations: Not on file    Relationship status: Not on file  . Intimate partner violence:    Fear of current or ex partner: Not on file    Emotionally abused: Not on file    Physically abused: Not on file    Forced sexual activity: Not on file  Other Topics Concern  . Not on file  Social History Narrative  . Not on file    Review of Systems: Pertinent positive and negative review of systems were  noted in the above HPI section.  All other review of systems was otherwise negative. Physical Exam: Vital signs in last 24 hours: Temp:  [97.5 F (36.4 C)-97.6 F (36.4 C)] 97.6 F (36.4 C) (08/13 0915) Pulse Rate:  [63-96] 91 (08/13 0915) Resp:  [12-24] 15 (08/13 0915) BP: (141-206)/(69-103) 195/103 (  08/13 0915) SpO2:  [91 %-99 %] 98 % (08/13 0915) Weight:  [76.2 kg] 76.2 kg (08/13 0259)   General:   Alert,  Well-developed, well-nourished, white male , cooperative in NAD, uncomfortable appearing Head:  Normocephalic and atraumatic. Eyes:  Sclera clear, no icterus.   Conjunctiva pink. Ears:  Normal auditory acuity. Nose:  No deformity, discharge,  or lesions. Mouth:  No deformity or lesions.   Neck:  Supple; no masses or thyromegaly. Lungs:  Clear throughout to auscultation.   No wheezes, crackles, or rhonchi. Heart:  Regular rate and rhythm; no murmurs, clicks, rubs,  or gallops. Abdomen:  Soft, nondistended, bowel sounds quiet, is diffusely tender across the upper abdomen with guarding no palpable mass or hepatosplenomegaly Rectal:  Deferred  Msk:  Symmetrical without gross deformities. . Pulses:  Normal pulses noted. Extremities:  Without clubbing or edema. Neurologic:  Alert and  oriented x4;  grossly normal neurologically. Skin:  Intact without significant lesions or rashes.. Psych:  Alert and cooperative. Normal mood and affect.  Intake/Output from previous day: 08/12 0701 - 08/13 0700 In: 1000 [IV Piggyback:1000] Out: -  Intake/Output this shift: Total I/O In: 1000 [IV Piggyback:1000] Out: -   Lab Results: Recent Labs    03/16/18 0300  WBC 15.2*  HGB 17.1*  HCT 51.5  PLT 212   BMET Recent Labs    03/16/18 0300  NA 144  K 3.7  CL 104  CO2 26  GLUCOSE 170*  BUN 10  CREATININE 1.06  CALCIUM 9.1   LFT Recent Labs    03/16/18 0300  PROT 6.6  ALBUMIN 4.0  AST 294*  ALT 197*  ALKPHOS 79  BILITOT 2.6*   PT/INR No results for input(s): LABPROT,  INR in the last 72 hours. Hepatitis Panel No results for input(s): HEPBSAG, HCVAB, HEPAIGM, HEPBIGM in the last 72 hours.    IMPRESSION:   #71 67 year old white male admitted this morning with acute gallstone pancreatitis. CT confirmed significant pancreatic edema, cholelithiasis and a dilated common bile duct at 11 mm consistent with choledocholithiasis  #2 hypertension #3.  History of adult onset diabetes mellitus #4.  Hyperlipidemia #5.  Smoker #6. Bilateral common iliac severe atherosclerosis on CT   #7 hepatitis C, patient is not been treated, unclear whether active  PLAN: Keep n.p.o. Vigorous volume replacement as doing-currently on LR at 200/h Pain control with PCA ERCP with stone extraction versus MRCP-to be determined based on labs for next 24 to 48 hours. Eventual surgical consultation for lap chole once pancreatitis resolves. I discussed with the patient's wife who is at bedside  Check hep C RNA quant  Thank you, we will follow with you     Sanjuanita Condrey  03/16/2018, 9:34 AM

## 2018-03-16 NOTE — ED Notes (Signed)
CT contacted for estimated time for imaging; 30-40 mins per tech

## 2018-03-17 ENCOUNTER — Encounter (HOSPITAL_COMMUNITY): Payer: Self-pay | Admitting: Anesthesiology

## 2018-03-17 ENCOUNTER — Inpatient Hospital Stay (HOSPITAL_COMMUNITY): Payer: PPO

## 2018-03-17 ENCOUNTER — Encounter (HOSPITAL_COMMUNITY)
Admission: EM | Disposition: A | Discharge status: Home Health | Payer: Self-pay | Source: Home / Self Care | Attending: Internal Medicine

## 2018-03-17 DIAGNOSIS — K8689 Other specified diseases of pancreas: Secondary | ICD-10-CM

## 2018-03-17 DIAGNOSIS — J810 Acute pulmonary edema: Secondary | ICD-10-CM

## 2018-03-17 DIAGNOSIS — N179 Acute kidney failure, unspecified: Secondary | ICD-10-CM

## 2018-03-17 DIAGNOSIS — I10 Essential (primary) hypertension: Secondary | ICD-10-CM

## 2018-03-17 LAB — COMPREHENSIVE METABOLIC PANEL
ALBUMIN: 3.3 g/dL — AB (ref 3.5–5.0)
ALT: 112 U/L — AB (ref 0–44)
AST: 78 U/L — AB (ref 15–41)
Alkaline Phosphatase: 66 U/L (ref 38–126)
Anion gap: 10 (ref 5–15)
BILIRUBIN TOTAL: 1.5 mg/dL — AB (ref 0.3–1.2)
BUN: 26 mg/dL — AB (ref 8–23)
CO2: 24 mmol/L (ref 22–32)
CREATININE: 1.27 mg/dL — AB (ref 0.61–1.24)
Calcium: 8.1 mg/dL — ABNORMAL LOW (ref 8.9–10.3)
Chloride: 107 mmol/L (ref 98–111)
GFR calc Af Amer: >60 mL/min (ref >60)
GFR, EST NON AFRICAN AMERICAN: 57 mL/min — AB (ref >60)
GLUCOSE: 196 mg/dL — AB (ref 70–99)
Potassium: 5.4 mmol/L — ABNORMAL HIGH (ref 3.5–5.1)
Sodium: 141 mmol/L (ref 135–145)
Total Protein: 5.6 g/dL — ABNORMAL LOW (ref 6.5–8.1)

## 2018-03-17 LAB — BASIC METABOLIC PANEL
Anion gap: 8 (ref 5–15)
BUN: 25 mg/dL — AB (ref 8–23)
CHLORIDE: 110 mmol/L (ref 98–111)
CO2: 23 mmol/L (ref 22–32)
CREATININE: 1.01 mg/dL (ref 0.61–1.24)
Calcium: 7.6 mg/dL — ABNORMAL LOW (ref 8.9–10.3)
GFR calc Af Amer: >60 mL/min (ref >60)
GLUCOSE: 191 mg/dL — AB (ref 70–99)
POTASSIUM: 4.3 mmol/L (ref 3.5–5.1)
SODIUM: 141 mmol/L (ref 135–145)

## 2018-03-17 LAB — CBC
HEMATOCRIT: 55.6 % — AB (ref 39.0–52.0)
HEMATOCRIT: 53.3 % — AB (ref 39.0–52.0)
Hemoglobin: 18.3 g/dL — ABNORMAL HIGH (ref 13.0–17.0)
Hemoglobin: 17.7 g/dL — ABNORMAL HIGH (ref 13.0–17.0)
MCH: 30.8 pg (ref 26.0–34.0)
MCH: 30.9 pg (ref 26.0–34.0)
MCHC: 32.9 g/dL (ref 30.0–36.0)
MCHC: 33.2 g/dL (ref 30.0–36.0)
MCV: 93.6 fL (ref 78.0–100.0)
MCV: 93.2 fL (ref 78.0–100.0)
PLATELETS: 187 10*3/uL (ref 150–400)
PLATELETS: 147 10*3/uL — AB (ref 150–400)
RBC: 5.94 MIL/uL — ABNORMAL HIGH (ref 4.22–5.81)
RBC: 5.72 MIL/uL (ref 4.22–5.81)
RDW: 13.8 % (ref 11.5–15.5)
RDW: 14.2 % (ref 11.5–15.5)
WBC: 14.9 10*3/uL — AB (ref 4.0–10.5)
WBC: 15.5 10*3/uL — AB (ref 4.0–10.5)

## 2018-03-17 LAB — HIV ANTIBODY (ROUTINE TESTING W REFLEX): HIV Screen 4th Generation wRfx: NONREACTIVE

## 2018-03-17 LAB — GLUCOSE, CAPILLARY
GLUCOSE-CAPILLARY: 196 mg/dL — AB (ref 70–99)
Glucose-Capillary: 174 mg/dL — ABNORMAL HIGH (ref 70–99)
Glucose-Capillary: 192 mg/dL — ABNORMAL HIGH (ref 70–99)
Glucose-Capillary: 175 mg/dL — ABNORMAL HIGH (ref 70–99)
Glucose-Capillary: 157 mg/dL — ABNORMAL HIGH (ref 70–99)

## 2018-03-17 SURGERY — ENDOSCOPIC RETROGRADE CHOLANGIOPANCREATOGRAPHY (ERCP) WITH PROPOFOL
Anesthesia: General

## 2018-03-17 MED ORDER — PATIROMER SORBITEX CALCIUM 8.4 G PO PACK
8.4000 g | PACK | Freq: Once | ORAL | Status: AC
  Administered 2018-03-17: 8.4 g via ORAL
  Filled 2018-03-17: qty 1

## 2018-03-17 MED ORDER — CHLORPROMAZINE HCL 25 MG/ML IJ SOLN
25.0000 mg | Freq: Three times a day (TID) | INTRAMUSCULAR | Status: DC | PRN
  Administered 2018-03-17 – 2018-03-18 (×3): 25 mg via INTRAMUSCULAR
  Filled 2018-03-17 (×4): qty 1

## 2018-03-17 MED ORDER — SODIUM CHLORIDE 0.9 % IV SOLN
1.5000 g | Freq: Four times a day (QID) | INTRAVENOUS | Status: DC
  Administered 2018-03-17: 1.5 g via INTRAVENOUS
  Filled 2018-03-17 (×2): qty 1.5

## 2018-03-17 MED ORDER — SODIUM CHLORIDE 0.9 % IV SOLN
1.0000 g | Freq: Three times a day (TID) | INTRAVENOUS | Status: DC
  Administered 2018-03-17 – 2018-03-26 (×26): 1 g via INTRAVENOUS
  Filled 2018-03-17 (×29): qty 1

## 2018-03-17 MED ORDER — SODIUM CHLORIDE 0.9 % IV BOLUS
1000.0000 mL | Freq: Once | INTRAVENOUS | Status: AC
  Administered 2018-03-17: 1000 mL via INTRAVENOUS

## 2018-03-17 MED ORDER — FUROSEMIDE 10 MG/ML IJ SOLN
40.0000 mg | Freq: Once | INTRAMUSCULAR | Status: AC
  Administered 2018-03-17: 40 mg via INTRAVENOUS
  Filled 2018-03-17: qty 4

## 2018-03-17 MED ORDER — GADOBENATE DIMEGLUMINE 529 MG/ML IV SOLN
15.0000 mL | Freq: Once | INTRAVENOUS | Status: AC
  Administered 2018-03-17: 15 mL via INTRAVENOUS

## 2018-03-17 NOTE — Significant Event (Signed)
Rapid Response Event Note  Overview: Time Called: 1634 Arrival Time: 1638 Event Type: Respiratory  Initial Focused Assessment: Patient with gallstone --> necrotizing pancreatitis per MD notes This afternoon he became SOB and HR increased to 130. Alert and Oriented, intermittent abd pain relieved with PCA diludid Distended abdomen, inc distension this afternoon per RN, hypoactive BS Lung sounds crackles in bases  HR tones regular BP  145/78  HR 130  RR 24  O2 sat 92% on 2.5 L [place]  Interventions: 12 lead EKG:  ST PCXR 40 mg lasix IV DR Mansouraty (GI) at bedside Dr Jerral RalphGhimire at bedside LR decreased to 125/hr  Transfer to SDU  4E04  Plan of Care (if not transferred):  Event Summary: Name of Physician Notified: Ghimire at 1630  Name of Consulting Physician Notified: Mansouraty  GI at    Outcome: Transferred (Comment)  Event End Time: 1930  Marcellina MillinLayton, Wayne Brunker

## 2018-03-17 NOTE — Consult Note (Addendum)
[facility] Surgery Consult Note  Ryan Chan [birth year]  [id number].    Requesting MD: Oren Binet Chief Complaint/Reason for Consult: gallstone pancreatitis  HPI:  Ryan Chan is a 67yo male admitted to [facility] 8/13 with gallstone pancreatitis. Patient states that he woke up 8/12 with severe epigastric pain. Describes the pain as burning. States that he has a hiatal hernia that has caused him pain in the past, but never this severe. Associated symptoms include nausea, vomiting, and chills. States that the pain gradually worsened so he went to the ED. In the ED he was found to have lipase 8026, bilirubin 2.6, AST 294, ALT 197, alk phos 79, WBC 15.2. CT scan showed acute pancreatitis with extensive retroperitoneal edema, no discrete acute peripancreatic collection, mild intra and extrahepatic biliary ductal dilatation with cholelithiasis.  GI was consulted for elevated bilirubin and recommended MRCP. This showed no choledocholithiasis, but did show worsening pancreatitis compared to CT scan with possible hemorrhagic component or necrotizing pancreatitis. General surgery consulted for consideration for laparoscopic cholecystectomy.  -PMH significant for HTN, HLD, DM, Hepatitis C, Severe aortic atherosclerosis -Abdominal surgical history: none -Anticoagulants: none -Smokes 2 PPD -Quit drinking alcohol 8-9 years ago -Employment: Armed forces operational officer  ROS: Review of Systems  Constitutional: Positive for chills.  HENT: Negative.   Eyes: Negative.   Respiratory: Negative.   Cardiovascular: Negative.   Gastrointestinal: Positive for abdominal pain, nausea and vomiting. Negative for diarrhea.  Genitourinary: Negative.   Musculoskeletal: Positive for back pain.  Skin: Negative.   Neurological: Negative.    All systems reviewed and otherwise negative except for as above  Family History  Problem Relation Age of Onset  . GI Disease Neg Hx     Past Medical  History:  Diagnosis Date  . Diabetes mellitus without complication ([place])   . Essential hypertension   . Hepatitis C     Past Surgical History:  Procedure Laterality Date  . BACK SURGERY      Social History:  reports that he has been smoking cigarettes. He has a 108.00 pack-year smoking history. He has never used smokeless tobacco. He reports that he drank alcohol. He reports that he has current or past drug history.  Allergies: No Known Allergies  Medications Prior to Admission  Medication Sig Dispense Refill  . atorvastatin (LIPITOR) 20 MG tablet Take 20 mg by mouth every evening.  3  . lisinopril (PRINIVIL,ZESTRIL) 5 MG tablet Take 5 mg by mouth daily.  3  . metoprolol succinate (TOPROL-XL) 25 MG 24 hr tablet Take 25 mg by mouth daily.  3  . nitroGLYCERIN (NITROSTAT) 0.4 MG SL tablet Place 0.4 mg under the tongue every 5 (five) minutes as needed for chest pain.   0    Prior to Admission medications   Medication Sig Start Date End Date Taking? Authorizing Provider  atorvastatin (LIPITOR) 20 MG tablet Take 20 mg by mouth every evening. 02/16/18  Yes [provider]  lisinopril (PRINIVIL,ZESTRIL) 5 MG tablet Take 5 mg by mouth daily. 02/18/18  Yes [provider]  metoprolol succinate (TOPROL-XL) 25 MG 24 hr tablet Take 25 mg by mouth daily. 02/02/18  Yes [provider]  nitroGLYCERIN (NITROSTAT) 0.4 MG SL tablet Place 0.4 mg under the tongue every 5 (five) minutes as needed for chest pain.  01/21/18  Yes [provider]    Blood pressure (!) 153/82, pulse (!) 109, temperature 98.9 F (37.2 C), temperature source Oral, resp. rate 20, height '5\' 5"'  (1.651 m), weight  73.7 kg, SpO2 94 %. Physical Exam: General: pleasant, WD/WN white male who is laying in bed in NAD HEENT: head is normocephalic, atraumatic.  Sclera are noninjected.  Pupils equal and round.  Ears and nose without any masses or lesions.  Mouth is pink and moist. Dentition fair Heart:  regular, rate, and rhythm.  No obvious murmurs, gallops, or rubs noted.  Palpable pedal pulses bilaterally Lungs: CTAB, no wheezes, rhonchi, or rales noted.  Respiratory effort nonlabored Abd: soft, distended, +BS, no masses, hernias, or organomegaly. Diffuse tenderness with most significant TTP in epigastric region with voluntary guarding MS: all 4 extremities are symmetrical with no cyanosis, clubbing, or edema. Skin: warm and dry with no masses, lesions, or rashes Psych: A&Ox3 with an appropriate affect. Neuro: cranial nerves grossly intact, extremity CSM intact bilaterally, normal speech  Results for orders placed or performed during the hospital encounter of 03/16/18 (from the past 48 hour(s))  Lipase, blood     Status: Abnormal   Collection Time: 03/16/18  3:00 AM  Result Value Ref Range   Lipase 8,026 (H) 11 - 51 U/L    Comment: RESULTS CONFIRMED BY MANUAL DILUTION Performed at [facility] [address], [place], [place] [address]   Comprehensive metabolic panel     Status: Abnormal   Collection Time: 03/16/18  3:00 AM  Result Value Ref Range   Sodium 144 135 - 145 mmol/L   Potassium 3.7 3.5 - 5.1 mmol/L   Chloride 104 98 - 111 mmol/L   CO2 26 22 - 32 mmol/L   Glucose, Bld 170 (H) 70 - 99 mg/dL   BUN 10 8 - 23 mg/dL   Creatinine, Ser 1.06 0.61 - 1.24 mg/dL   Calcium 9.1 8.9 - 10.3 mg/dL   Total Protein 6.6 6.5 - 8.1 g/dL   Albumin 4.0 3.5 - 5.0 g/dL   AST 294 (H) 15 - 41 U/L   ALT 197 (H) 0 - 44 U/L   Alkaline Phosphatase 79 38 - 126 U/L   Total Bilirubin 2.6 (H) 0.3 - 1.2 mg/dL   GFR calc non Af Amer >60 >60 mL/min   GFR calc Af Amer >60 >60 mL/min    Comment: (NOTE) The eGFR has been calculated using the CKD EPI equation. This calculation has not been validated in all clinical situations. eGFR's persistently <60 mL/min signify possible Chronic Kidney Disease.    Anion gap 14 5 - 15    Comment: Performed at [facility] [address],  [place], [place] [address]  CBC     Status: Abnormal   Collection Time: 03/16/18  3:00 AM  Result Value Ref Range   WBC 15.2 (H) 4.0 - 10.5 K/uL   RBC 5.53 4.22 - 5.81 MIL/uL   Hemoglobin 17.1 (H) 13.0 - 17.0 g/dL   HCT 51.5 39.0 - 52.0 %   MCV 93.1 78.0 - 100.0 fL   MCH 30.9 26.0 - 34.0 pg   MCHC 33.2 30.0 - 36.0 g/dL   RDW 13.3 11.5 - 15.5 %   Platelets 212 150 - 400 K/uL    Comment: Performed at [facility], [place] [address] [place], [place] [address]  Troponin I     Status: None   Collection Time: 03/16/18  3:00 AM  Result Value Ref Range   Troponin I <0.03 <0.03 ng/mL    Comment: Performed at [facility] [address], [place], [place] [address]  Glucose, capillary  Status: Abnormal   Collection Time: 03/16/18  9:24 AM  Result Value Ref Range   Glucose-Capillary 227 (H) 70 - 99 mg/dL   Comment 1 Notify RN   HIV antibody (Routine Testing)     Status: None   Collection Time: 03/16/18  9:40 AM  Result Value Ref Range   HIV Screen 4th Generation wRfx Non Reactive Non Reactive    Comment: (NOTE) Performed At: [facility] [place], [place] [id number] Rush Farmer MD HE:[id number]   Lactate dehydrogenase     Status: Abnormal   Collection Time: 03/16/18  9:40 AM  Result Value Ref Range   LDH 242 (H) 98 - 192 U/L    Comment: Performed at [facility], [place] [address], [place], [place] [address]  Glucose, capillary     Status: Abnormal   Collection Time: 03/16/18 12:25 PM  Result Value Ref Range   Glucose-Capillary 249 (H) 70 - 99 mg/dL   Comment 1 Notify RN   Glucose, capillary     Status: Abnormal   Collection Time: 03/16/18  5:32 PM  Result Value Ref Range   Glucose-Capillary 212 (H) 70 - 99 mg/dL   Comment 1 Notify RN   Glucose, capillary     Status: Abnormal   Collection Time: 03/16/18  9:40 PM  Result Value Ref Range   Glucose-Capillary 199 (H) 70 - 99 mg/dL   Comment 1 Document in Chart   Comprehensive metabolic panel      Status: Abnormal   Collection Time: 03/17/18  4:56 AM  Result Value Ref Range   Sodium 141 135 - 145 mmol/L   Potassium 5.4 (H) 3.5 - 5.1 mmol/L    Comment: DELTA CHECK NOTED   Chloride 107 98 - 111 mmol/L   CO2 24 22 - 32 mmol/L   Glucose, Bld 196 (H) 70 - 99 mg/dL   BUN 26 (H) 8 - 23 mg/dL   Creatinine, Ser 1.27 (H) 0.61 - 1.24 mg/dL   Calcium 8.1 (L) 8.9 - 10.3 mg/dL   Total Protein 5.6 (L) 6.5 - 8.1 g/dL   Albumin 3.3 (L) 3.5 - 5.0 g/dL   AST 78 (H) 15 - 41 U/L   ALT 112 (H) 0 - 44 U/L   Alkaline Phosphatase 66 38 - 126 U/L   Total Bilirubin 1.5 (H) 0.3 - 1.2 mg/dL   GFR calc non Af Amer 57 (L) >60 mL/min   GFR calc Af Amer >60 >60 mL/min    Comment: (NOTE) The eGFR has been calculated using the CKD EPI equation. This calculation has not been validated in all clinical situations. eGFR's persistently <60 mL/min signify possible Chronic Kidney Disease.    Anion gap 10 5 - 15    Comment: Performed at [facility] [address] [place], [place] [address]  CBC     Status: Abnormal   Collection Time: 03/17/18  4:56 AM  Result Value Ref Range   WBC 14.9 (H) 4.0 - 10.5 K/uL    Comment: REPEATED TO VERIFY   RBC 5.94 (H) 4.22 - 5.81 MIL/uL   Hemoglobin 18.3 (H) 13.0 - 17.0 g/dL    Comment: REPEATED TO VERIFY   HCT 55.6 (H) 39.0 - 52.0 %    Comment: REPEATED TO VERIFY   MCV 93.6 78.0 - 100.0 fL   MCH 30.8 26.0 - 34.0 pg   MCHC 32.9 30.0 - 36.0 g/dL   RDW 13.8 11.5 - 15.5 %   Platelets  187 150 - 400 K/uL    Comment: CONSISTENT WITH PREVIOUS RESULT Performed at [facility], [place] [address], [place], [place] [address]   Glucose, capillary     Status: Abnormal   Collection Time: 03/17/18  6:07 AM  Result Value Ref Range   Glucose-Capillary 174 (H) 70 - 99 mg/dL  Glucose, capillary     Status: Abnormal   Collection Time: 03/17/18  9:34 AM  Result Value Ref Range   Glucose-Capillary 192 (H) 70 - 99 mg/dL  Glucose, capillary     Status: Abnormal   Collection  Time: 03/17/18 11:37 AM  Result Value Ref Range   Glucose-Capillary 175 (H) 70 - 99 mg/dL   Ct Abdomen Pelvis W Contrast  Result Date: 03/16/2018 CLINICAL DATA:  67 y/o  M; epigastric pain. EXAM: CT ABDOMEN AND PELVIS WITH CONTRAST TECHNIQUE: Multidetector CT imaging of the abdomen and pelvis was performed using the standard protocol following bolus administration of intravenous contrast. CONTRAST:  100 cc Omnipaque 300 COMPARISON:  01/25/2008 CT of the abdomen and pelvis. FINDINGS: Lower chest: No acute abnormality. Hepatobiliary: Mild intra and extrahepatic biliary ductal dilatation with the common bile duct measuring up to 11 mm. No focal liver lesion. Cholelithiasis. Pancreas: Severe extensive edema surrounding the pancreas compatible with acute pancreatitis. No discrete acute peripancreatic collection. Spleen: Normal in size without focal abnormality. Adrenals/Urinary Tract: Adrenal glands are unremarkable. Kidneys are normal, without renal calculi, focal lesion, or hydronephrosis. Bladder is unremarkable. Stomach/Bowel: Stomach is within normal limits. Mild wall thickening of the duodenum, likely reactive inflammatory changes. Appendix appears normal. No evidence of bowel wall thickening, distention, or inflammatory changes of the jejunum, ileum, or colon. Vascular/Lymphatic: Severe mixed atherosclerosis of the aorta. Bilateral common iliac and internal iliac artery aneurysms. The right common iliac artery aneurysm measures 2 cm and left 1.7 cm. No lymphadenopathy. Reproductive: Prostate is unremarkable. Other: No abdominal wall hernia or abnormality. No abdominopelvic ascites. Musculoskeletal: No fracture is seen. Moderate degenerative changes of the lumbar spine. IMPRESSION: 1. Acute pancreatitis with extensive retroperitoneal edema. No discrete acute peripancreatic collection identified at this time. Mild intra and extrahepatic biliary ductal dilatation with cholelithiasis may represent  choledocholithiasis and gallstone pancreatitis. This can be further characterized with MRI/MRCP of the abdomen. 2. Severe aortic atherosclerosis with bilateral common iliac artery and internal iliac artery aneurysms. Electronically Signed   By: Kristine Garbe M.D.   On: 03/16/2018 05:49   Mr 3d Recon At Scanner  Result Date: 03/17/2018 CLINICAL DATA:  Acute pancreatitis, etiology unknown. Cholelithiasis, diabetes and hepatitis C. EXAM: MRI ABDOMEN WITHOUT AND WITH CONTRAST (INCLUDING MRCP) TECHNIQUE: Multiplanar multisequence MR imaging of the abdomen was performed both before and after the administration of intravenous contrast. Heavily T2-weighted images of the biliary and pancreatic ducts were obtained, and three-dimensional MRCP images were rendered by post processing. CONTRAST:  60m MULTIHANCE GADOBENATE DIMEGLUMINE 529 MG/ML IV SOLN COMPARISON:  Abdominal CT 03/16/2018 and 01/25/2008. FINDINGS: Lower chest: New small bilateral pleural effusions with associated subsegmental atelectasis at both lung bases. Hepatobiliary: The liver is normal in appearance without morphologic changes of cirrhosis, focal lesion or abnormal enhancement. Large gallstones are again noted. The common hepatic duct measures 7 mm in diameter. There is no evidence of choledocholithiasis. Pancreas: There is moderate diffuse enlargement of the pancreas with extensive surrounding inflammatory changes. Prior to contrast, there is heterogeneous increased T1 signal throughout the pancreatic body and tail, suspicious for hemorrhage. Following contrast, there is very poor enhancement of the pancreas, especially  in the body and tail. Portions of the pancreatic head enhance normally. There is no pancreatic ductal dilatation. Normal pancreatic ductal anatomy. Spleen: Normal in size without focal abnormality. Adrenals/Urinary Tract: Both adrenal glands appear normal. Small bilateral renal cysts. No evidence of renal mass or  hydronephrosis. Stomach/Bowel: No evidence of bowel wall thickening or focal inflammation. Stable mild colonic dilatation, most consistent with an ileus. Vascular/Lymphatic: There are no enlarged abdominal lymph nodes. No significant vascular findings. The portal, superior mesenteric and splenic veins are patent. Other: There is a moderate amount of ascites. There is extensive inflammation and ill-defined fluid throughout the retroperitoneal and mesenteric fat. No well-defined fluid collections are demonstrated. Musculoskeletal: No acute or significant osseous findings. IMPRESSION: 1. Worsening severe acute pancreatitis with suspicion of hemorrhagic components. There is a very poor enhancement of most of the pancreas, consistent with necrotizing pancreatitis and implying a poor prognosis. 2. Associated increased ascites, inflammation and ill-defined fluid throughout the intra-abdominal fat, small bilateral pleural effusions and bibasilar atelectasis. 3. Cholelithiasis without evidence of significant biliary dilatation or choledocholithiasis. Electronically Signed   By: Richardean Sale M.D.   On: 03/17/2018 10:41   Mr Abdomen Mrcp Moise Boring Contast  Result Date: 03/17/2018 CLINICAL DATA:  Acute pancreatitis, etiology unknown. Cholelithiasis, diabetes and hepatitis C. EXAM: MRI ABDOMEN WITHOUT AND WITH CONTRAST (INCLUDING MRCP) TECHNIQUE: Multiplanar multisequence MR imaging of the abdomen was performed both before and after the administration of intravenous contrast. Heavily T2-weighted images of the biliary and pancreatic ducts were obtained, and three-dimensional MRCP images were rendered by post processing. CONTRAST:  70m MULTIHANCE GADOBENATE DIMEGLUMINE 529 MG/ML IV SOLN COMPARISON:  Abdominal CT 03/16/2018 and 01/25/2008. FINDINGS: Lower chest: New small bilateral pleural effusions with associated subsegmental atelectasis at both lung bases. Hepatobiliary: The liver is normal in appearance without morphologic  changes of cirrhosis, focal lesion or abnormal enhancement. Large gallstones are again noted. The common hepatic duct measures 7 mm in diameter. There is no evidence of choledocholithiasis. Pancreas: There is moderate diffuse enlargement of the pancreas with extensive surrounding inflammatory changes. Prior to contrast, there is heterogeneous increased T1 signal throughout the pancreatic body and tail, suspicious for hemorrhage. Following contrast, there is very poor enhancement of the pancreas, especially in the body and tail. Portions of the pancreatic head enhance normally. There is no pancreatic ductal dilatation. Normal pancreatic ductal anatomy. Spleen: Normal in size without focal abnormality. Adrenals/Urinary Tract: Both adrenal glands appear normal. Small bilateral renal cysts. No evidence of renal mass or hydronephrosis. Stomach/Bowel: No evidence of bowel wall thickening or focal inflammation. Stable mild colonic dilatation, most consistent with an ileus. Vascular/Lymphatic: There are no enlarged abdominal lymph nodes. No significant vascular findings. The portal, superior mesenteric and splenic veins are patent. Other: There is a moderate amount of ascites. There is extensive inflammation and ill-defined fluid throughout the retroperitoneal and mesenteric fat. No well-defined fluid collections are demonstrated. Musculoskeletal: No acute or significant osseous findings. IMPRESSION: 1. Worsening severe acute pancreatitis with suspicion of hemorrhagic components. There is a very poor enhancement of most of the pancreas, consistent with necrotizing pancreatitis and implying a poor prognosis. 2. Associated increased ascites, inflammation and ill-defined fluid throughout the intra-abdominal fat, small bilateral pleural effusions and bibasilar atelectasis. 3. Cholelithiasis without evidence of significant biliary dilatation or choledocholithiasis. Electronically Signed   By: WRichardean SaleM.D.   On:  03/17/2018 10:41   Anti-infectives (From admission, onward)   Start     Dose/Rate Route Frequency Ordered Stop   03/17/18 1300  ampicillin-sulbactam (UNASYN) 1.5 g in sodium chloride 0.9 % 100 mL IVPB     1.5 g 200 mL/hr over 30 Minutes Intravenous Every 6 hours 03/17/18 1132          Assessment/Plan HTN HLD Tobacco abuse - smokes 2 PPD DM - per patient well controlled with diet, not on any medications Hepatitis C - never received treatment. Consider checking hepatitis C RNA quant Severe aortic atherosclerosis  Gallstone pancreatitis - no prior h/o abdominal surgery - upon admission lipase 8026, bilirubin 2.6, AST 294, ALT 197, alk phos 79, WBC 15.2.  - CT scan 8/13 showed acute pancreatitis with extensive retroperitoneal edema, no discrete acute peripancreatic collection, mild intra and extrahepatic biliary ductal dilatation with cholelithiasis - MRCP 8/14 showed no choledocholithiasis, but did show worsening pancreatitis compared to CT scan with possible hemorrhagic component or necrotizing pancreatitis  ID - unasyn 8/14>> VTE - SCDs, lovenox FEN - IVF, NPO Foley - none  Plan - Patient with worsening pancreatitis, felt to be due to gallstones. Will consider laparoscopic cholecystectomy once pancreatitis resolves. Continue current treatment per GI/triad. Repeat CMP/lipase in [place], [facility] Surgery 03/17/2018, 12:25 PM Pager: [phone] Consults: [phone] Mon 7:00 am -11:30 AM Tues-Fri 7:00 am-4:30 pm Sat-Sun 7:00 am-11:30 am

## 2018-03-17 NOTE — Progress Notes (Signed)
Patient is complaining of mild SOB and pulse is sustaining at 125-130. BP 156/74. Rapid response paged to assess patient and MD paged. Received orders for STAT EKG and chest Xray. Will continue to monitor.

## 2018-03-17 NOTE — Progress Notes (Signed)
Informed by RN-patient has developed mild SOB and tachycardia  O/E Looks comfortable Lungs:has new bibasilar rales  CXR: shows some pul congestion  Imp Early Vol Overload/mild pul edema-+10 L so far Necrotizing pancreatitis  Plan Lasix 40 mg x 1  Decrease IVF to 125 cc/hr GI MD (at bedside as well) suggests trial of clear liquids Transfer to SDU If deteriorates will need transfer to ICU Will sign out to our night coverage service Unable to reach patients spouse over the phone.  Total time spent 40 min Time in 5 pm Time out 5:40 pm

## 2018-03-17 NOTE — Progress Notes (Signed)
Patient ID: Waynetta PeanCharles Dean Hoefling, male   DOB: [birth year], 67 y.o.   MRN: [id number]     Progress Note   Subjective    Awake, alert - says pain not as bed . Hurts to move but has been getting up to bathroom , no SOB. Has urinated twice this am  MRCP - no choledocholithiasis - pancreatitis worsening with ?hemorrhagiic  Components, poor enhancement  of most of pancreas,increased ascites and ill defined fluid - cholelithiasis   Objective   Vital signs in last 24 hours: Temp:  [97.6 F (36.4 C)-98.9 F (37.2 C)] 98.9 F (37.2 C) (08/14 0508) Pulse Rate:  [90-111] 109 (08/14 0508) Resp:  [13-23] 20 (08/14 0508) BP: (143-195)/(75-110) 153/82 (08/14 0508) SpO2:  [93 %-98 %] 96 % (08/14 0508) FiO2 (%):  [96 %] 96 % (08/13 1700) Weight:  [73.7 kg] 73.7 kg (08/13 0915) Last BM Date: 03/15/18 General:    WM  in NAD, comfortable ,  Heart:  Regular rate and rhythm; no murmurs- rate 90's Lungs: Respirations even and unlabored, decreased BS bases Abdomen:  Soft, few BS heard, diffusely  tender with guarding Extremities:  Without edema. Neurologic:  Alert and oriented,  grossly normal neurologically. Psych:  Cooperative. Normal mood and affect.  Intake/Output from previous day: 08/13 0701 - 08/14 0700 In: 5505.5 [P.O.:120; I.V.:4385.5; IV Piggyback:1000] Out: -  Intake/Output this shift: No intake/output data recorded.  Lab Results: Recent Labs    03/16/18 0300 03/17/18 0456  WBC 15.2* 14.9*  HGB 17.1* 18.3*  HCT 51.5 55.6*  PLT 212 187   BMET Recent Labs    03/16/18 0300 03/17/18 0456  NA 144 141  K 3.7 5.4*  CL 104 107  CO2 26 24  GLUCOSE 170* 196*  BUN 10 26*  CREATININE 1.06 1.27*  CALCIUM 9.1 8.1*   LFT Recent Labs    03/17/18 0456  PROT 5.6*  ALBUMIN 3.3*  AST 78*  ALT 112*  ALKPHOS 66  BILITOT 1.5*   PT/INR No results for input(s): LABPROT, INR in the last 72 hours.  Studies/Results: Ct Abdomen Pelvis W Contrast  Result Date:  03/16/2018 CLINICAL DATA:  67 y/o  M; epigastric pain. EXAM: CT ABDOMEN AND PELVIS WITH CONTRAST TECHNIQUE: Multidetector CT imaging of the abdomen and pelvis was performed using the standard protocol following bolus administration of intravenous contrast. CONTRAST:  100 cc Omnipaque 300 COMPARISON:  01/25/2008 CT of the abdomen and pelvis. FINDINGS: Lower chest: No acute abnormality. Hepatobiliary: Mild intra and extrahepatic biliary ductal dilatation with the common bile duct measuring up to 11 mm. No focal liver lesion. Cholelithiasis. Pancreas: Severe extensive edema surrounding the pancreas compatible with acute pancreatitis. No discrete acute peripancreatic collection. Spleen: Normal in size without focal abnormality. Adrenals/Urinary Tract: Adrenal glands are unremarkable. Kidneys are normal, without renal calculi, focal lesion, or hydronephrosis. Bladder is unremarkable. Stomach/Bowel: Stomach is within normal limits. Mild wall thickening of the duodenum, likely reactive inflammatory changes. Appendix appears normal. No evidence of bowel wall thickening, distention, or inflammatory changes of the jejunum, ileum, or colon. Vascular/Lymphatic: Severe mixed atherosclerosis of the aorta. Bilateral common iliac and internal iliac artery aneurysms. The right common iliac artery aneurysm measures 2 cm and left 1.7 cm. No lymphadenopathy. Reproductive: Prostate is unremarkable. Other: No abdominal wall hernia or abnormality. No abdominopelvic ascites. Musculoskeletal: No fracture is seen. Moderate degenerative changes of the lumbar spine. IMPRESSION: 1. Acute pancreatitis with extensive retroperitoneal edema. No discrete acute peripancreatic collection identified at this time. Mild intra  and extrahepatic biliary ductal dilatation with cholelithiasis may represent choledocholithiasis and gallstone pancreatitis. This can be further characterized with MRI/MRCP of the abdomen. 2. Severe aortic atherosclerosis with  bilateral common iliac artery and internal iliac artery aneurysms. Electronically Signed   By: Mitzi HansenLance  Furusawa-Stratton M.D.   On: 03/16/2018 05:49       Assessment / Plan:     #1 67 yo WM with acute gallstone pancreatitis  With SIRS- has passed CBD stone   MRCP concerning for worsening pancreatitis, necrosis, ?hemorrhagic components  #2 AKI - creat has bumped , hgb 18 - will give another 1000 cc bolus now, and increase fluids to 250 cc /hr  Will need strict I/Os   Plan;  Keep NPO  incentive spirometry   Vigorous volume replacement  Strict I/O's Start IV Unasyn Watch closely  repeat BMET , CBC this afternoon           Contact  [name], P.A.-C               [phone]      Principal Problem:   Gallstone pancreatitis Active Problems:   Elevated LFTs   Hepatitis C   Essential hypertension   Diet-controlled diabetes mellitus (HCC)     LOS: 1 day   [name]  03/17/2018, 9:10 AM

## 2018-03-17 NOTE — Anesthesia Preprocedure Evaluation (Deleted)
Anesthesia Evaluation    Airway        Dental   Pulmonary Current Smoker,           Cardiovascular hypertension, Pt. on medications and Pt. on home beta blockers      Neuro/Psych negative neurological ROS  negative psych ROS   GI/Hepatic negative GI ROS, (+) Hepatitis -, C  Endo/Other  diabetes, Type 2  Renal/GU      Musculoskeletal   Abdominal   Peds  Hematology   Anesthesia Other Findings   Reproductive/Obstetrics                             Lab Results  Component Value Date   WBC 14.9 (H) 03/17/2018   HGB 18.3 (H) 03/17/2018   HCT 55.6 (H) 03/17/2018   MCV 93.6 03/17/2018   PLT 187 03/17/2018   Lab Results  Component Value Date   CREATININE 1.27 (H) 03/17/2018   BUN 26 (H) 03/17/2018   NA 141 03/17/2018   K 5.4 (H) 03/17/2018   CL 107 03/17/2018   CO2 24 03/17/2018   No results found for: INR, PROTIME  EKG: normal sinus rhythm, incomplete LBBB.   Anesthesia Physical Anesthesia Plan  ASA: III  Anesthesia Plan: General   Post-op Pain Management:    Induction: Intravenous  PONV Risk Score and Plan: 2 and Ondansetron and Dexamethasone  Airway Management Planned: Oral ETT  Additional Equipment: None  Intra-op Plan:   Post-operative Plan: Extubation in OR  Informed Consent:   Plan Discussed with:   Anesthesia Plan Comments: (Repeat K+)        Anesthesia Quick Evaluation

## 2018-03-17 NOTE — Progress Notes (Signed)
Attending called report to NP about pt's SOB episode with rales. Lasix was given and IVF rate cut in 1/2. Attending asked NP to follow pt closely as he may worsen. NP to bedside at shift change.  S: pt states he is a "little" SOB. Denies Chest pain. + epigastric pain. No n/v.  O: Fairly well appearing WM in NAD. Alert and oriented. O2 sat 93% on 4L [place]. Respiratory effort is normal as well as rate. Tachycardic which is not new. Sinus. Afebrile. BP 1 teens to 1 twenties.  A/P: 1. SOB episode late afternoon. Attending gave Lasix and moved to SDU. IVF rate cut in 1/2 (125cc/hr now). Pt looks comfortable now without any increase respiratory effort. He is tachycardic, but it is sinus. He has reason to be tachycardic due to infection and pain. Will watch for now. Plan discussed with night RN.  Will follow.  KJKG, NP [facility]

## 2018-03-17 NOTE — Progress Notes (Addendum)
PROGRESS NOTE        PATIENT DETAILS Name: Ryan Chan Age: 67 y.o. Sex: male Date of Birth: [birth year] Admit Date: 03/16/2018 Admitting Physician Jonah Blue, MD AVW:[id number], Pcp Not In  Brief Narrative: Patient is a 67 y.o. male with a history of hypertension, dyslipidemia presented with acute gallstone pancreatitis, MRI on 8/14 suggestive of necrotizing pancreatitis.  Being managed with supportive care.  General surgery and GI currently following.  Subjective: Abdominal pain somewhat better-but on Dilaudid PCA.  Assessment/Plan: Acute necrotizing gallstone pancreatitis: Continue aggressive IV fluid hydration, Dilaudid PCA and other supportive care.  With worsening renal function and increase in hemoglobin-IV fluids have been increased.  Started on IV Unasyn by gastroenterology, will change from Unasyn to Primaxin.  MRCP does not show any choledocholithiasis.  GI and general surgery following.  Will monitor closely, he is at risk for deteriorating further.  Acute kidney injury: Likely hemodynamically mediated-in the setting of necrotizing pancreatitis.  IV fluids increased to 250 cc an hour.  Mild hyperkalemia: 1 dose of Veltassa-repeat electrolytes tomorrow morning.  Hypertension: Controlled with IV metoprolol.  DVT Prophylaxis: Prophylactic Lovenox   Code Status: Full code   Family Communication: None at bedside  Disposition Plan: Remain inpatient-will require several days of hospitalization.  Antimicrobial agents: Anti-infectives (From admission, onward)   Start     Dose/Rate Route Frequency Ordered Stop   03/17/18 1300  ampicillin-sulbactam (UNASYN) 1.5 g in sodium chloride 0.9 % 100 mL IVPB  Status:  Discontinued     1.5 g 200 mL/hr over 30 Minutes Intravenous Every 6 hours 03/17/18 1132 03/17/18 1440      Procedures: None  CONSULTS: GI CCS  Time spent: 35- minutes-Greater than 50% of this time was spent in  counseling, explanation of diagnosis, planning of further management, and coordination of care.  MEDICATIONS: Scheduled Meds: . enoxaparin (LOVENOX) injection  40 mg Subcutaneous Q24H  . HYDROmorphone   Intravenous Q4H  . insulin aspart  0-15 Units Subcutaneous TID WC  . insulin aspart  0-5 Units Subcutaneous QHS  . metoprolol tartrate  2.5 mg Intravenous Q8H  . nicotine  21 mg Transdermal Daily  . patiromer  8.4 g Oral Once   Continuous Infusions: . lactated ringers 250 mL/hr at 03/17/18 1407   PRN Meds:.acetaminophen **OR** acetaminophen, chlorproMAZINE (THORAZINE) injection, diphenhydrAMINE **OR** diphenhydrAMINE, hydrALAZINE, naloxone **AND** sodium chloride flush, ondansetron **OR** ondansetron (ZOFRAN) IV   PHYSICAL EXAM: Vital signs: Vitals:   03/17/18 0508 03/17/18 0916 03/17/18 1148 03/17/18 1354  BP: (!) 153/82   (!) 159/84  Pulse: (!) 109   (!) 101  Resp: 20  20 18   Temp: 98.9 F (37.2 C)   98.3 F (36.8 C)  TempSrc: Oral   Oral  SpO2: 96% 96% 94% 95%  Weight:      Height:       Filed Weights   03/16/18 0259 03/16/18 0915  Weight: 76.2 kg 73.7 kg   Body mass index is 27.02 kg/m.   General appearance :Awake, alert, not in any distress. Speech Clear. Eyes:, pupils equally reactive to light and accomodation,no scleral icterus.Pink conjunctiva HEENT: Atraumatic and Normocephalic Neck: supple, no JVD. No cervical lymphadenopathy. No thyromegaly Resp:Good air entry bilaterally, no added sounds  CVS: S1 S2 regular, no murmurs.  GI: Tender in the upper abdomen without any peritoneal signs. Extremities: B/L Lower Ext shows  no edema, both legs are warm to touch Neurology:  speech clear,Non focal, sensation is grossly intact. Psychiatric: Normal judgment and insight. Alert and oriented x 3. Normal mood. Musculoskeletal:No digital cyanosis Skin:No Rash, warm and dry Wounds:N/A  I have personally reviewed following labs and imaging studies  LABORATORY  DATA: CBC: Recent Labs  Lab 03/16/18 0300 03/17/18 0456  WBC 15.2* 14.9*  HGB 17.1* 18.3*  HCT 51.5 55.6*  MCV 93.1 93.6  PLT 212 187    Basic Metabolic Panel: Recent Labs  Lab 03/16/18 0300 03/17/18 0456  NA 144 141  K 3.7 5.4*  CL 104 107  CO2 26 24  GLUCOSE 170* 196*  BUN 10 26*  CREATININE 1.06 1.27*  CALCIUM 9.1 8.1*    GFR: Estimated Creatinine Clearance: 49.1 mL/min (A) (by C-G formula based on SCr of 1.27 mg/dL (H)).  Liver Function Tests: Recent Labs  Lab 03/16/18 0300 03/17/18 0456  AST 294* 78*  ALT 197* 112*  ALKPHOS 79 66  BILITOT 2.6* 1.5*  PROT 6.6 5.6*  ALBUMIN 4.0 3.3*   Recent Labs  Lab 03/16/18 0300  LIPASE 8,026*   No results for input(s): AMMONIA in the last 168 hours.  Coagulation Profile: No results for input(s): INR, PROTIME in the last 168 hours.  Cardiac Enzymes: Recent Labs  Lab 03/16/18 0300  TROPONINI <0.03    BNP (last 3 results) No results for input(s): PROBNP in the last 8760 hours.  HbA1C: No results for input(s): HGBA1C in the last 72 hours.  CBG: Recent Labs  Lab 03/16/18 1732 03/16/18 2140 03/17/18 0607 03/17/18 0934 03/17/18 1137  GLUCAP 212* 199* 174* 192* 175*    Lipid Profile: No results for input(s): CHOL, HDL, LDLCALC, TRIG, CHOLHDL, LDLDIRECT in the last 72 hours.  Thyroid Function Tests: No results for input(s): TSH, T4TOTAL, FREET4, T3FREE, THYROIDAB in the last 72 hours.  Anemia Panel: No results for input(s): VITAMINB12, FOLATE, FERRITIN, TIBC, IRON, RETICCTPCT in the last 72 hours.  Urine analysis: No results found for: COLORURINE, APPEARANCEUR, LABSPEC, PHURINE, GLUCOSEU, HGBUR, BILIRUBINUR, KETONESUR, PROTEINUR, UROBILINOGEN, NITRITE, LEUKOCYTESUR  Sepsis Labs: Lactic Acid, Venous No results found for: LATICACIDVEN  MICROBIOLOGY: No results found for this or any previous visit (from the past 240 hour(s)).  RADIOLOGY STUDIES/RESULTS: Ct Abdomen Pelvis W Contrast  Result  Date: 03/16/2018 CLINICAL DATA:  67 y/o  M; epigastric pain. EXAM: CT ABDOMEN AND PELVIS WITH CONTRAST TECHNIQUE: Multidetector CT imaging of the abdomen and pelvis was performed using the standard protocol following bolus administration of intravenous contrast. CONTRAST:  100 cc Omnipaque 300 COMPARISON:  01/25/2008 CT of the abdomen and pelvis. FINDINGS: Lower chest: No acute abnormality. Hepatobiliary: Mild intra and extrahepatic biliary ductal dilatation with the common bile duct measuring up to 11 mm. No focal liver lesion. Cholelithiasis. Pancreas: Severe extensive edema surrounding the pancreas compatible with acute pancreatitis. No discrete acute peripancreatic collection. Spleen: Normal in size without focal abnormality. Adrenals/Urinary Tract: Adrenal glands are unremarkable. Kidneys are normal, without renal calculi, focal lesion, or hydronephrosis. Bladder is unremarkable. Stomach/Bowel: Stomach is within normal limits. Mild wall thickening of the duodenum, likely reactive inflammatory changes. Appendix appears normal. No evidence of bowel wall thickening, distention, or inflammatory changes of the jejunum, ileum, or colon. Vascular/Lymphatic: Severe mixed atherosclerosis of the aorta. Bilateral common iliac and internal iliac artery aneurysms. The right common iliac artery aneurysm measures 2 cm and left 1.7 cm. No lymphadenopathy. Reproductive: Prostate is unremarkable. Other: No abdominal wall hernia or abnormality. No abdominopelvic  ascites. Musculoskeletal: No fracture is seen. Moderate degenerative changes of the lumbar spine. IMPRESSION: 1. Acute pancreatitis with extensive retroperitoneal edema. No discrete acute peripancreatic collection identified at this time. Mild intra and extrahepatic biliary ductal dilatation with cholelithiasis may represent choledocholithiasis and gallstone pancreatitis. This can be further characterized with MRI/MRCP of the abdomen. 2. Severe aortic atherosclerosis with  bilateral common iliac artery and internal iliac artery aneurysms. Electronically Signed   By: Mitzi HansenLance  Furusawa-Stratton M.D.   On: 03/16/2018 05:49   Mr 3d Recon At Scanner  Result Date: 03/17/2018 CLINICAL DATA:  Acute pancreatitis, etiology unknown. Cholelithiasis, diabetes and hepatitis C. EXAM: MRI ABDOMEN WITHOUT AND WITH CONTRAST (INCLUDING MRCP) TECHNIQUE: Multiplanar multisequence MR imaging of the abdomen was performed both before and after the administration of intravenous contrast. Heavily T2-weighted images of the biliary and pancreatic ducts were obtained, and three-dimensional MRCP images were rendered by post processing. CONTRAST:  15mL MULTIHANCE GADOBENATE DIMEGLUMINE 529 MG/ML IV SOLN COMPARISON:  Abdominal CT 03/16/2018 and 01/25/2008. FINDINGS: Lower chest: New small bilateral pleural effusions with associated subsegmental atelectasis at both lung bases. Hepatobiliary: The liver is normal in appearance without morphologic changes of cirrhosis, focal lesion or abnormal enhancement. Large gallstones are again noted. The common hepatic duct measures 7 mm in diameter. There is no evidence of choledocholithiasis. Pancreas: There is moderate diffuse enlargement of the pancreas with extensive surrounding inflammatory changes. Prior to contrast, there is heterogeneous increased T1 signal throughout the pancreatic body and tail, suspicious for hemorrhage. Following contrast, there is very poor enhancement of the pancreas, especially in the body and tail. Portions of the pancreatic head enhance normally. There is no pancreatic ductal dilatation. Normal pancreatic ductal anatomy. Spleen: Normal in size without focal abnormality. Adrenals/Urinary Tract: Both adrenal glands appear normal. Small bilateral renal cysts. No evidence of renal mass or hydronephrosis. Stomach/Bowel: No evidence of bowel wall thickening or focal inflammation. Stable mild colonic dilatation, most consistent with an ileus.  Vascular/Lymphatic: There are no enlarged abdominal lymph nodes. No significant vascular findings. The portal, superior mesenteric and splenic veins are patent. Other: There is a moderate amount of ascites. There is extensive inflammation and ill-defined fluid throughout the retroperitoneal and mesenteric fat. No well-defined fluid collections are demonstrated. Musculoskeletal: No acute or significant osseous findings. IMPRESSION: 1. Worsening severe acute pancreatitis with suspicion of hemorrhagic components. There is a very poor enhancement of most of the pancreas, consistent with necrotizing pancreatitis and implying a poor prognosis. 2. Associated increased ascites, inflammation and ill-defined fluid throughout the intra-abdominal fat, small bilateral pleural effusions and bibasilar atelectasis. 3. Cholelithiasis without evidence of significant biliary dilatation or choledocholithiasis. Electronically Signed   By: Carey BullocksWilliam  Veazey M.D.   On: 03/17/2018 10:41   Mr Abdomen Mrcp Vivien RossettiW Wo Contast  Result Date: 03/17/2018 CLINICAL DATA:  Acute pancreatitis, etiology unknown. Cholelithiasis, diabetes and hepatitis C. EXAM: MRI ABDOMEN WITHOUT AND WITH CONTRAST (INCLUDING MRCP) TECHNIQUE: Multiplanar multisequence MR imaging of the abdomen was performed both before and after the administration of intravenous contrast. Heavily T2-weighted images of the biliary and pancreatic ducts were obtained, and three-dimensional MRCP images were rendered by post processing. CONTRAST:  15mL MULTIHANCE GADOBENATE DIMEGLUMINE 529 MG/ML IV SOLN COMPARISON:  Abdominal CT 03/16/2018 and 01/25/2008. FINDINGS: Lower chest: New small bilateral pleural effusions with associated subsegmental atelectasis at both lung bases. Hepatobiliary: The liver is normal in appearance without morphologic changes of cirrhosis, focal lesion or abnormal enhancement. Large gallstones are again noted. The common hepatic duct measures 7 mm in diameter.  There is  no evidence of choledocholithiasis. Pancreas: There is moderate diffuse enlargement of the pancreas with extensive surrounding inflammatory changes. Prior to contrast, there is heterogeneous increased T1 signal throughout the pancreatic body and tail, suspicious for hemorrhage. Following contrast, there is very poor enhancement of the pancreas, especially in the body and tail. Portions of the pancreatic head enhance normally. There is no pancreatic ductal dilatation. Normal pancreatic ductal anatomy. Spleen: Normal in size without focal abnormality. Adrenals/Urinary Tract: Both adrenal glands appear normal. Small bilateral renal cysts. No evidence of renal mass or hydronephrosis. Stomach/Bowel: No evidence of bowel wall thickening or focal inflammation. Stable mild colonic dilatation, most consistent with an ileus. Vascular/Lymphatic: There are no enlarged abdominal lymph nodes. No significant vascular findings. The portal, superior mesenteric and splenic veins are patent. Other: There is a moderate amount of ascites. There is extensive inflammation and ill-defined fluid throughout the retroperitoneal and mesenteric fat. No well-defined fluid collections are demonstrated. Musculoskeletal: No acute or significant osseous findings. IMPRESSION: 1. Worsening severe acute pancreatitis with suspicion of hemorrhagic components. There is a very poor enhancement of most of the pancreas, consistent with necrotizing pancreatitis and implying a poor prognosis. 2. Associated increased ascites, inflammation and ill-defined fluid throughout the intra-abdominal fat, small bilateral pleural effusions and bibasilar atelectasis. 3. Cholelithiasis without evidence of significant biliary dilatation or choledocholithiasis. Electronically Signed   By: Carey Bullocks M.D.   On: 03/17/2018 10:41     LOS: 1 day   Jeoffrey Massed, MD  [facility]  If 7PM-7AM, please contact night-coverage  Please page via  [email or web address] [email or web address]-click on MD name and type text message  03/17/2018, 2:41 PM

## 2018-03-18 ENCOUNTER — Inpatient Hospital Stay (HOSPITAL_COMMUNITY): Payer: PPO

## 2018-03-18 LAB — COMPREHENSIVE METABOLIC PANEL
ALT: 65 U/L — AB (ref 0–44)
AST: 57 U/L — AB (ref 15–41)
Albumin: 2.8 g/dL — ABNORMAL LOW (ref 3.5–5.0)
Alkaline Phosphatase: 45 U/L (ref 38–126)
Anion gap: 10 (ref 5–15)
BUN: 25 mg/dL — AB (ref 8–23)
CHLORIDE: 104 mmol/L (ref 98–111)
CO2: 28 mmol/L (ref 22–32)
CREATININE: 1.15 mg/dL (ref 0.61–1.24)
Calcium: 8.1 mg/dL — ABNORMAL LOW (ref 8.9–10.3)
GFR calc non Af Amer: >60 mL/min (ref >60)
Glucose, Bld: 199 mg/dL — ABNORMAL HIGH (ref 70–99)
POTASSIUM: 4.9 mmol/L (ref 3.5–5.1)
SODIUM: 142 mmol/L (ref 135–145)
Total Bilirubin: 1.6 mg/dL — ABNORMAL HIGH (ref 0.3–1.2)
Total Protein: 5.1 g/dL — ABNORMAL LOW (ref 6.5–8.1)

## 2018-03-18 LAB — CBC
HCT: 52.1 % — ABNORMAL HIGH (ref 39.0–52.0)
Hemoglobin: 17 g/dL (ref 13.0–17.0)
MCH: 30.7 pg (ref 26.0–34.0)
MCHC: 32.6 g/dL (ref 30.0–36.0)
MCV: 94 fL (ref 78.0–100.0)
PLATELETS: 130 10*3/uL — AB (ref 150–400)
RBC: 5.54 MIL/uL (ref 4.22–5.81)
RDW: 14.1 % (ref 11.5–15.5)
WBC: 12.6 10*3/uL — AB (ref 4.0–10.5)

## 2018-03-18 LAB — GLUCOSE, CAPILLARY
GLUCOSE-CAPILLARY: 174 mg/dL — AB (ref 70–99)
GLUCOSE-CAPILLARY: 164 mg/dL — AB (ref 70–99)
GLUCOSE-CAPILLARY: 123 mg/dL — AB (ref 70–99)
Glucose-Capillary: 139 mg/dL — ABNORMAL HIGH (ref 70–99)

## 2018-03-18 LAB — LIPASE, BLOOD: Lipase: 934 U/L — ABNORMAL HIGH (ref 11–51)

## 2018-03-18 MED ORDER — BISACODYL 10 MG RE SUPP
10.0000 mg | Freq: Every day | RECTAL | Status: DC | PRN
  Filled 2018-03-18: qty 1

## 2018-03-18 MED ORDER — POLYETHYLENE GLYCOL 3350 17 G PO PACK
17.0000 g | PACK | Freq: Every day | ORAL | Status: DC | PRN
  Filled 2018-03-18: qty 1

## 2018-03-18 NOTE — Progress Notes (Addendum)
Initial Nutrition Assessment  DOCUMENTATION CODES:   Obesity unspecified  INTERVENTION:    Initiate TF via Cortrak feeding tube with Vital AF 1.2 at 20 ml/hr and increase by 10 ml every 4 hours to goal rate of 80 ml/hr  Provides 2304 kcals, 144 gm protein, 1557 ml free water daily  Cortrak Tube Team is available Monday, Wednesday, Friday & Saturday from 8:00 AM to 4:00 PM.  NUTRITION DIAGNOSIS:   Inadequate oral intake related to altered GI function(necrotizing pancreatitis) as evidenced by NPO status  GOAL:   Patient will meet greater than or equal to 90% of their needs  MONITOR:   TF tolerance, Labs, Skin, Weight trends, I & O's  REASON FOR ASSESSMENT:   Consult Enteral/tube feeding initiation and management  ASSESSMENT:   67 yo Male with PMH of dyslipidemia presented with acute gallstone pancreatitis, MRI on 8/14 suggestive of necrotizing pancreatitis.    RD spoke with pt and pt's wife, Steward DroneBrenda.  Pt having abdominal pain. Hiccupping. Has PCA. Steward DroneBrenda reports pt's last solid meal was supper Mon, 8/12.  Prior to onset of symptoms pt had a good appetite and was eating well. He also endorses a 45 lb intentional weight loss due to DM diet modifications. Labs and medications reviewed. Lipase 934 (H). CBG's [id number].  Pt allowed to have sips of clear liquids. Had some juice this AM. RD explained process of tube placement and TF initiation. Wife and pt demonstrated good understanding.  NUTRITION - FOCUSED PHYSICAL EXAM:  Completed. No muscle or fat depletions noticed.  Diet Order:   Diet Order            Diet NPO time specified Except for: Sips with Meds, Ice Chips, Other (See Comments)  Diet effective now             EDUCATION NEEDS:   Not appropriate for education at this time  Skin:  Skin Assessment: Reviewed RN Assessment  Last BM:  8/13   Intake/Output Summary (Last 24 hours) at 03/18/2018 1309 Last data filed at 03/18/2018 0500 Gross per 24 hour   Intake 5579.28 ml  Output 790 ml  Net 4789.28 ml   Height:   Ht Readings from Last 1 Encounters:  03/16/18 5\' 5"  (1.651 m)   Weight:   Wt Readings from Last 1 Encounters:  03/18/18 84 kg   Ideal Body Weight:  61.8 kg  BMI:  Body mass index is 30.8 kg/m.  Estimated Nutritional Needs:   Kcal:  2300-2500  Protein:  130-145 gm  Fluid:  2.3-2.5 L  Maureen ChattersKatie Muhamad Serano, RD, LDN Pager #: [phone] After-Hours Pager #: [phone]

## 2018-03-18 NOTE — Progress Notes (Signed)
[facility]/Trauma Progress Note  1 Day Post-Op   Assessment/Plan HTN HLD Tobacco abuse - smokes 2 PPD DM - per patient well controlled with diet, not on any medications Hepatitis C - never received treatment. Consider checking hepatitis C RNA quant Severe aortic atherosclerosis  Gallstone pancreatitis - no prior h/o abdominal surgery - CT scan 8/13 showed acute pancreatitis with extensive retroperitoneal edema, no discrete acute peripancreatic collection, mild intra and extrahepatic biliary ductal dilatation with cholelithiasis - MRCP 8/14 showed no choledocholithiasis, but did show worsening pancreatitis compared to CT scan with possible hemorrhagic component or necrotizing pancreatitis  ID - unasyn 8/14>> VTE - SCDs, lovenox FEN - IVF, NPO Foley - none  Plan - eventually will need lap chole, timing depending on severity of pancreatitis. Continue current treatment per GI/triad. Repeat CMP/lipase in AM. Continue NPO.    LOS: 2 days    Subjective: CC: abdominal pain  Pain about the same. Pt states he is bloated with copious hiccups and belching. Some flatus. No nausea. Discussed that we need to wait for pancreatitis to improve before we consider lap chole. Discussed ileus and importance of ambulation. No family at bedside.   Objective: Vital signs in last 24 hours: Temp:  [98.1 F (36.7 C)-99.7 F (37.6 C)] 98.1 F (36.7 C) (08/15 0805) Pulse Rate:  [101-147] 123 (08/15 0805) Resp:  [18-26] 20 (08/15 0805) BP: (119-162)/(67-87) 162/87 (08/15 0805) SpO2:  [93 %-96 %] 94 % (08/15 0805) Weight:  [84 kg] 84 kg (08/15 0208) Last BM Date: 03/16/18  Intake/Output from previous day: 08/14 0701 - 08/15 0700 In: 5579.3 [P.O.:480; I.V.:2614.3; IV Piggyback:2485] Out: 790 [Urine:790] Intake/Output this shift: No intake/output data recorded.  PE: Gen:  Alert, NAD, pleasant, cooperative Pulm:  Rate and effort normal Abd: Soft, distended, hypoactive BS,  generalized TTP with guarding, no peritonitis Skin: no rashes noted, warm and dry   Anti-infectives: Anti-infectives (From admission, onward)   Start     Dose/Rate Route Frequency Ordered Stop   03/17/18 1600  meropenem (MERREM) 1 g in sodium chloride 0.9 % 100 mL IVPB     1 g 200 mL/hr over 30 Minutes Intravenous Every 8 hours 03/17/18 1507     03/17/18 1300  ampicillin-sulbactam (UNASYN) 1.5 g in sodium chloride 0.9 % 100 mL IVPB  Status:  Discontinued     1.5 g 200 mL/hr over 30 Minutes Intravenous Every 6 hours 03/17/18 1132 03/17/18 1440      Lab Results:  Recent Labs    03/17/18 1414 03/18/18 0337  WBC 15.5* 12.6*  HGB 17.7* 17.0  HCT 53.3* 52.1*  PLT 147* 130*   BMET Recent Labs    03/17/18 1414 03/18/18 0337  NA 141 142  K 4.3 4.9  CL 110 104  CO2 23 28  GLUCOSE 191* 199*  BUN 25* 25*  CREATININE 1.01 1.15  CALCIUM 7.6* 8.1*   PT/INR No results for input(s): LABPROT, INR in the last 72 hours. CMP     Component Value Date/Time   NA 142 03/18/2018 0337   K 4.9 03/18/2018 0337   CL 104 03/18/2018 0337   CO2 28 03/18/2018 0337   GLUCOSE 199 (H) 03/18/2018 0337   BUN 25 (H) 03/18/2018 0337   CREATININE 1.15 03/18/2018 0337   CALCIUM 8.1 (L) 03/18/2018 0337   PROT 5.1 (L) 03/18/2018 0337   ALBUMIN 2.8 (L) 03/18/2018 0337   AST 57 (H) 03/18/2018 0337   ALT 65 (H) 03/18/2018 0337   ALKPHOS 45  03/18/2018 0337   BILITOT 1.6 (H) 03/18/2018 0337   GFRNONAA >60 03/18/2018 0337   GFRAA >60 03/18/2018 0337   Lipase     Component Value Date/Time   LIPASE 934 (H) 03/18/2018 0337    Studies/Results: Mr 3d Recon At Scanner  Result Date: 03/17/2018 CLINICAL DATA:  Acute pancreatitis, etiology unknown. Cholelithiasis, diabetes and hepatitis C. EXAM: MRI ABDOMEN WITHOUT AND WITH CONTRAST (INCLUDING MRCP) TECHNIQUE: Multiplanar multisequence MR imaging of the abdomen was performed both before and after the administration of intravenous contrast. Heavily  T2-weighted images of the biliary and pancreatic ducts were obtained, and three-dimensional MRCP images were rendered by post processing. CONTRAST:  15mL MULTIHANCE GADOBENATE DIMEGLUMINE 529 MG/ML IV SOLN COMPARISON:  Abdominal CT 03/16/2018 and 01/25/2008. FINDINGS: Lower chest: New small bilateral pleural effusions with associated subsegmental atelectasis at both lung bases. Hepatobiliary: The liver is normal in appearance without morphologic changes of cirrhosis, focal lesion or abnormal enhancement. Large gallstones are again noted. The common hepatic duct measures 7 mm in diameter. There is no evidence of choledocholithiasis. Pancreas: There is moderate diffuse enlargement of the pancreas with extensive surrounding inflammatory changes. Prior to contrast, there is heterogeneous increased T1 signal throughout the pancreatic body and tail, suspicious for hemorrhage. Following contrast, there is very poor enhancement of the pancreas, especially in the body and tail. Portions of the pancreatic head enhance normally. There is no pancreatic ductal dilatation. Normal pancreatic ductal anatomy. Spleen: Normal in size without focal abnormality. Adrenals/Urinary Tract: Both adrenal glands appear normal. Small bilateral renal cysts. No evidence of renal mass or hydronephrosis. Stomach/Bowel: No evidence of bowel wall thickening or focal inflammation. Stable mild colonic dilatation, most consistent with an ileus. Vascular/Lymphatic: There are no enlarged abdominal lymph nodes. No significant vascular findings. The portal, superior mesenteric and splenic veins are patent. Other: There is a moderate amount of ascites. There is extensive inflammation and ill-defined fluid throughout the retroperitoneal and mesenteric fat. No well-defined fluid collections are demonstrated. Musculoskeletal: No acute or significant osseous findings. IMPRESSION: 1. Worsening severe acute pancreatitis with suspicion of hemorrhagic components.  There is a very poor enhancement of most of the pancreas, consistent with necrotizing pancreatitis and implying a poor prognosis. 2. Associated increased ascites, inflammation and ill-defined fluid throughout the intra-abdominal fat, small bilateral pleural effusions and bibasilar atelectasis. 3. Cholelithiasis without evidence of significant biliary dilatation or choledocholithiasis. Electronically Signed   By: Carey BullocksWilliam  Veazey M.D.   On: 03/17/2018 10:41   Dg Chest Port 1 View  Result Date: 03/17/2018 CLINICAL DATA:  Short of breath and abdominal pain.  Pancreatitis. EXAM: PORTABLE CHEST 1 VIEW COMPARISON:  01/20/2018 FINDINGS: Midline trachea. Normal heart size for level of inspiration. Aortic atherosclerosis. Probable layering small left pleural effusion. No pneumothorax. Low lung volumes with resultant pulmonary interstitial prominence. Left greater than right base airspace disease is new. IMPRESSION: Low lung volumes with probable small left pleural effusion and bibasilar airspace disease. Airspace disease is favored to represent atelectasis. Especially at the left lung base, infection or aspiration cannot be excluded Aortic Atherosclerosis (ICD10-I70.0). Electronically Signed   By: Jeronimo GreavesKyle  Talbot M.D.   On: 03/17/2018 17:26   Mr Abdomen Mrcp Vivien RossettiW Wo Contast  Result Date: 03/17/2018 CLINICAL DATA:  Acute pancreatitis, etiology unknown. Cholelithiasis, diabetes and hepatitis C. EXAM: MRI ABDOMEN WITHOUT AND WITH CONTRAST (INCLUDING MRCP) TECHNIQUE: Multiplanar multisequence MR imaging of the abdomen was performed both before and after the administration of intravenous contrast. Heavily T2-weighted images of the biliary and pancreatic ducts were  obtained, and three-dimensional MRCP images were rendered by post processing. CONTRAST:  15mL MULTIHANCE GADOBENATE DIMEGLUMINE 529 MG/ML IV SOLN COMPARISON:  Abdominal CT 03/16/2018 and 01/25/2008. FINDINGS: Lower chest: New small bilateral pleural effusions with  associated subsegmental atelectasis at both lung bases. Hepatobiliary: The liver is normal in appearance without morphologic changes of cirrhosis, focal lesion or abnormal enhancement. Large gallstones are again noted. The common hepatic duct measures 7 mm in diameter. There is no evidence of choledocholithiasis. Pancreas: There is moderate diffuse enlargement of the pancreas with extensive surrounding inflammatory changes. Prior to contrast, there is heterogeneous increased T1 signal throughout the pancreatic body and tail, suspicious for hemorrhage. Following contrast, there is very poor enhancement of the pancreas, especially in the body and tail. Portions of the pancreatic head enhance normally. There is no pancreatic ductal dilatation. Normal pancreatic ductal anatomy. Spleen: Normal in size without focal abnormality. Adrenals/Urinary Tract: Both adrenal glands appear normal. Small bilateral renal cysts. No evidence of renal mass or hydronephrosis. Stomach/Bowel: No evidence of bowel wall thickening or focal inflammation. Stable mild colonic dilatation, most consistent with an ileus. Vascular/Lymphatic: There are no enlarged abdominal lymph nodes. No significant vascular findings. The portal, superior mesenteric and splenic veins are patent. Other: There is a moderate amount of ascites. There is extensive inflammation and ill-defined fluid throughout the retroperitoneal and mesenteric fat. No well-defined fluid collections are demonstrated. Musculoskeletal: No acute or significant osseous findings. IMPRESSION: 1. Worsening severe acute pancreatitis with suspicion of hemorrhagic components. There is a very poor enhancement of most of the pancreas, consistent with necrotizing pancreatitis and implying a poor prognosis. 2. Associated increased ascites, inflammation and ill-defined fluid throughout the intra-abdominal fat, small bilateral pleural effusions and bibasilar atelectasis. 3. Cholelithiasis without  evidence of significant biliary dilatation or choledocholithiasis. Electronically Signed   By: Carey BullocksWilliam  Veazey M.D.   On: 03/17/2018 10:41      Jerre SimonJessica L Focht , [facility] 03/18/2018, 9:13 AM  Pager: [phone] Mon-Wed, Friday 7:00am-4:30pm Thurs 7am-11:30am  Consults: [phone]

## 2018-03-18 NOTE — Progress Notes (Addendum)
Daily Rounding Note  03/18/2018, 10:30 AM  LOS: 2 days   SUBJECTIVE:   Chief complaint: pain in abdomen continues at same moderately severe level.  PCA is helping    Passing gas, no stool.  No nausea. Hiccups at times.  Tolerating sips of clears.   IVF rate decreased to 125 last night due to SOB and CXR findings.    OBJECTIVE:         Vital signs in last 24 hours:    Temp:  [98.1 F (36.7 C)-99.7 F (37.6 C)] 98.1 F (36.7 C) (08/15 0805) Pulse Rate:  [101-147] 123 (08/15 0805) Resp:  [18-26] 20 (08/15 0805) BP: (119-162)/(67-87) 162/87 (08/15 0805) SpO2:  [93 %-95 %] 94 % (08/15 0805) Weight:  [84 kg] 84 kg (08/15 0208) Last BM Date: 03/16/18 Filed Weights   03/16/18 0259 03/16/18 0915 03/18/18 4540  Weight: 76.2 kg 73.7 kg 84 kg   General: does not look acutely ill.  Resting still in bed, awake   Heart: Tachy to 120s Chest: no labored breathing.  Rales in bases Abdomen: soft, tender globally but > in bil lower quads.  BS active.  Slight distention.  No bruising or discoloration  Extremities: no CCE.  Feet warm Neuro/Psych:  Oriented x 3.  Fully alert.  No gross deficits.    Intake/Output from previous day: 08/14 0701 - 08/15 0700 In: 5579.3 [P.O.:480; I.V.:2614.3; IV Piggyback:2485] Out: 790 [Urine:790]  Intake/Output this shift: No intake/output data recorded.  Lab Results: Recent Labs    03/17/18 0456 03/17/18 1414 03/18/18 0337  WBC 14.9* 15.5* 12.6*  HGB 18.3* 17.7* 17.0  HCT 55.6* 53.3* 52.1*  PLT 187 147* 130*   BMET Recent Labs    03/17/18 0456 03/17/18 1414 03/18/18 0337  NA 141 141 142  K 5.4* 4.3 4.9  CL 107 110 104  CO2 24 23 28   GLUCOSE 196* 191* 199*  BUN 26* 25* 25*  CREATININE 1.27* 1.01 1.15  CALCIUM 8.1* 7.6* 8.1*   LFT Recent Labs    03/16/18 0300 03/17/18 0456 03/18/18 0337  PROT 6.6 5.6* 5.1*  ALBUMIN 4.0 3.3* 2.8*  AST 294* 78* 57*  ALT 197* 112* 65*    ALKPHOS 79 66 45  BILITOT 2.6* 1.5* 1.6*   PT/INR No results for input(s): LABPROT, INR in the last 72 hours. Hepatitis Panel No results for input(s): HEPBSAG, HCVAB, HEPAIGM, HEPBIGM in the last 72 hours.  Studies/Results: Mr 3d Recon At Scanner  Result Date: 03/17/2018 CLINICAL DATA:  Acute pancreatitis, etiology unknown. Cholelithiasis, diabetes and hepatitis C. EXAM: MRI ABDOMEN WITHOUT AND WITH CONTRAST (INCLUDING MRCP) TECHNIQUE: Multiplanar multisequence MR imaging of the abdomen was performed both before and after the administration of intravenous contrast. Heavily T2-weighted images of the biliary and pancreatic ducts were obtained, and three-dimensional MRCP images were rendered by post processing. CONTRAST:  15mL MULTIHANCE GADOBENATE DIMEGLUMINE 529 MG/ML IV SOLN COMPARISON:  Abdominal CT 03/16/2018 and 01/25/2008. FINDINGS: Lower chest: New small bilateral pleural effusions with associated subsegmental atelectasis at both lung bases. Hepatobiliary: The liver is normal in appearance without morphologic changes of cirrhosis, focal lesion or abnormal enhancement. Large gallstones are again noted. The common hepatic duct measures 7 mm in diameter. There is no evidence of choledocholithiasis. Pancreas: There is moderate diffuse enlargement of the pancreas with extensive surrounding inflammatory changes. Prior to contrast, there is heterogeneous increased T1 signal throughout the pancreatic body and tail, suspicious for hemorrhage. Following contrast,  there is very poor enhancement of the pancreas, especially in the body and tail. Portions of the pancreatic head enhance normally. There is no pancreatic ductal dilatation. Normal pancreatic ductal anatomy. Spleen: Normal in size without focal abnormality. Adrenals/Urinary Tract: Both adrenal glands appear normal. Small bilateral renal cysts. No evidence of renal mass or hydronephrosis. Stomach/Bowel: No evidence of bowel wall thickening or focal  inflammation. Stable mild colonic dilatation, most consistent with an ileus. Vascular/Lymphatic: There are no enlarged abdominal lymph nodes. No significant vascular findings. The portal, superior mesenteric and splenic veins are patent. Other: There is a moderate amount of ascites. There is extensive inflammation and ill-defined fluid throughout the retroperitoneal and mesenteric fat. No well-defined fluid collections are demonstrated. Musculoskeletal: No acute or significant osseous findings. IMPRESSION: 1. Worsening severe acute pancreatitis with suspicion of hemorrhagic components. There is a very poor enhancement of most of the pancreas, consistent with necrotizing pancreatitis and implying a poor prognosis. 2. Associated increased ascites, inflammation and ill-defined fluid throughout the intra-abdominal fat, small bilateral pleural effusions and bibasilar atelectasis. 3. Cholelithiasis without evidence of significant biliary dilatation or choledocholithiasis. Electronically Signed   By: Carey BullocksWilliam  Veazey M.D.   On: 03/17/2018 10:41   Dg Chest Port 1 View  Result Date: 03/17/2018 CLINICAL DATA:  Short of breath and abdominal pain.  Pancreatitis. EXAM: PORTABLE CHEST 1 VIEW COMPARISON:  01/20/2018 FINDINGS: Midline trachea. Normal heart size for level of inspiration. Aortic atherosclerosis. Probable layering small left pleural effusion. No pneumothorax. Low lung volumes with resultant pulmonary interstitial prominence. Left greater than right base airspace disease is new. IMPRESSION: Low lung volumes with probable small left pleural effusion and bibasilar airspace disease. Airspace disease is favored to represent atelectasis. Especially at the left lung base, infection or aspiration cannot be excluded Aortic Atherosclerosis (ICD10-I70.0). Electronically Signed   By: Jeronimo GreavesKyle  Talbot M.D.   On: 03/17/2018 17:26   Dg Abd Portable 2v  Result Date: 03/18/2018 CLINICAL DATA:  History of hypertension. EXAM:  PORTABLE ABDOMEN - 2 VIEW COMPARISON:  None. FINDINGS: Small pleural effusions. Air-filled loops of large and small bowel are identified with air to the level the rectum. There is no definitive dilatation. No free air, portal venous gas, or pneumatosis. No other acute abnormalities identified. IMPRESSION: Mildly prominent air-filled loops of large and small bowel without obstruction. A mild ileus is not excluded. Tiny pleural effusions. No other acute abnormalities. Electronically Signed   By: Gerome Samavid  Williams III M.D   On: 03/18/2018 09:18   Mr Abdomen Mrcp W Wo Contast  Result Date: 03/17/2018 CLINICAL DATA:  Acute pancreatitis, etiology unknown. Cholelithiasis, diabetes and hepatitis C. EXAM: MRI ABDOMEN WITHOUT AND WITH CONTRAST (INCLUDING MRCP) TECHNIQUE: Multiplanar multisequence MR imaging of the abdomen was performed both before and after the administration of intravenous contrast. Heavily T2-weighted images of the biliary and pancreatic ducts were obtained, and three-dimensional MRCP images were rendered by post processing. CONTRAST:  15mL MULTIHANCE GADOBENATE DIMEGLUMINE 529 MG/ML IV SOLN COMPARISON:  Abdominal CT 03/16/2018 and 01/25/2008. FINDINGS: Lower chest: New small bilateral pleural effusions with associated subsegmental atelectasis at both lung bases. Hepatobiliary: The liver is normal in appearance without morphologic changes of cirrhosis, focal lesion or abnormal enhancement. Large gallstones are again noted. The common hepatic duct measures 7 mm in diameter. There is no evidence of choledocholithiasis. Pancreas: There is moderate diffuse enlargement of the pancreas with extensive surrounding inflammatory changes. Prior to contrast, there is heterogeneous increased T1 signal throughout the pancreatic body and tail, suspicious for hemorrhage.  Following contrast, there is very poor enhancement of the pancreas, especially in the body and tail. Portions of the pancreatic head enhance normally.  There is no pancreatic ductal dilatation. Normal pancreatic ductal anatomy. Spleen: Normal in size without focal abnormality. Adrenals/Urinary Tract: Both adrenal glands appear normal. Small bilateral renal cysts. No evidence of renal mass or hydronephrosis. Stomach/Bowel: No evidence of bowel wall thickening or focal inflammation. Stable mild colonic dilatation, most consistent with an ileus. Vascular/Lymphatic: There are no enlarged abdominal lymph nodes. No significant vascular findings. The portal, superior mesenteric and splenic veins are patent. Other: There is a moderate amount of ascites. There is extensive inflammation and ill-defined fluid throughout the retroperitoneal and mesenteric fat. No well-defined fluid collections are demonstrated. Musculoskeletal: No acute or significant osseous findings. IMPRESSION: 1. Worsening severe acute pancreatitis with suspicion of hemorrhagic components. There is a very poor enhancement of most of the pancreas, consistent with necrotizing pancreatitis and implying a poor prognosis. 2. Associated increased ascites, inflammation and ill-defined fluid throughout the intra-abdominal fat, small bilateral pleural effusions and bibasilar atelectasis. 3. Cholelithiasis without evidence of significant biliary dilatation or choledocholithiasis. Electronically Signed   By: Carey BullocksWilliam  Veazey M.D.   On: 03/17/2018 10:41   Scheduled Meds: . enoxaparin (LOVENOX) injection  40 mg Subcutaneous Q24H  . HYDROmorphone   Intravenous Q4H  . insulin aspart  0-15 Units Subcutaneous TID WC  . insulin aspart  0-5 Units Subcutaneous QHS  . metoprolol tartrate  2.5 mg Intravenous Q8H  . nicotine  21 mg Transdermal Daily   Continuous Infusions: . lactated ringers 125 mL/hr at 03/18/18 0347  . meropenem (MERREM) IV 1 g (03/18/18 0838)   PRN Meds:.acetaminophen **OR** acetaminophen, chlorproMAZINE (THORAZINE) injection, diphenhydrAMINE **OR** diphenhydrAMINE, hydrALAZINE, naloxone **AND**  sodium chloride flush, ondansetron **OR** ondansetron (ZOFRAN) IV  ASSESMENT:   *   Severe, acute gallstone pancreatitis.  Felt to have passed the CBD stone.  MRCP concerning for necrosis.    On Meropenem, LR at 125/hour.  NPO WBCs, LFTs, Lipase improved Ileus on abdominal films but no nausea and passing gas  *   Stable AKI.    *   Small left pleural effusion, BB atx.  SOB of yesterday improved.    PLAN   *  Supportive care.  Wonder if he should start TNA ? *  Not inclined to place NGT at this point.   *  Ideally would like increased rate of IVF but with SOB yesterday ....    Jennye MoccasinSarah Marlaya Turck  03/18/2018, 10:30 AM Phone [phone]

## 2018-03-18 NOTE — Progress Notes (Addendum)
PROGRESS NOTE        PATIENT DETAILS Name: Ryan Chan Age: 67 y.o. Sex: male Date of Birth: [birth year] Admit Date: 03/16/2018 Admitting Physician Jonah Blue, MD ZOX:[id number], Pcp Not In  Brief Narrative: Patient is a 67 y.o. male with a history of hypertension, dyslipidemia presented with acute gallstone pancreatitis, MRI on 8/14 suggestive of necrotizing pancreatitis.  Being managed with supportive care.  General surgery and GI currently following.  Subjective: Pain continues-on Dilaudid PCA.  Tolerating some sips of liquids but nothing more than that.  Having some hiccups as well.  Tachycardic but not short of breath.  Lungs with only a few scattered bibasilar rales but mostly clear this morning.  Assessment/Plan: Acute necrotizing gallstone pancreatitis: Essentially unchanged-continues to have significant pain requiring PCA Dilaudid.  Did develop some mild pulmonary edema yesterday which clinically has improved after IV Lasix and decrease IV fluids.  He claims that he tried some clear liquids yesterday but started having belching/hiccups-abdominal pain not any better.  Suspect it is best to start him on enteral feeding-postpyloric feeding-let him cool down further.  Remains on empiric meropenem given necrotizing pancreatitis.  MRCP did not show any choledocholithiasis, LFTs slowly downtrending.  Appreciate GI and general surgery input.  When he is more stable he will probably require a cholecystectomy at some point. Continue close monitoring in the stepdown unit  Mild pulmonary edema: Secondary to aggressive IV fluid hydration in the setting of necrotizing pancreatitis.  He is so far +11 L-improved with 1 dose of Lasix and decreasing IV fluids.  Apparently had echo/stress test done recently at [facility] will try and get records from there.  Continue to follow closely.  Acute kidney injury: Likely hemodynamically mediated-in the setting of  necrotizing pancreatitis.  Improved with hydration and subsequently with 1 dose of Lasix due to mild pulmonary edema.   Mild hyperkalemia: Resolved.  Hypertension: Blood to be well controlled with IV metoprolol.  We will follow and adjust accordingly  DVT Prophylaxis: Prophylactic Lovenox   Code Status: Full code   Family Communication: None at bedside  Disposition Plan: Remain inpatient-will require several days of hospitalization.  Antimicrobial agents: Anti-infectives (From admission, onward)   Start     Dose/Rate Route Frequency Ordered Stop   03/17/18 1600  meropenem (MERREM) 1 g in sodium chloride 0.9 % 100 mL IVPB     1 g 200 mL/hr over 30 Minutes Intravenous Every 8 hours 03/17/18 1507     03/17/18 1300  ampicillin-sulbactam (UNASYN) 1.5 g in sodium chloride 0.9 % 100 mL IVPB  Status:  Discontinued     1.5 g 200 mL/hr over 30 Minutes Intravenous Every 6 hours 03/17/18 1132 03/17/18 1440      Procedures: None  CONSULTS: GI CCS  Time spent: 35- minutes-Greater than 50% of this time was spent in counseling, explanation of diagnosis, planning of further management, and coordination of care.  MEDICATIONS: Scheduled Meds: . enoxaparin (LOVENOX) injection  40 mg Subcutaneous Q24H  . HYDROmorphone   Intravenous Q4H  . insulin aspart  0-15 Units Subcutaneous TID WC  . insulin aspart  0-5 Units Subcutaneous QHS  . metoprolol tartrate  2.5 mg Intravenous Q8H  . nicotine  21 mg Transdermal Daily   Continuous Infusions: . lactated ringers 125 mL/hr at 03/18/18 0347  . meropenem (MERREM) IV 1 g (03/18/18 4098)  PRN Meds:.acetaminophen **OR** acetaminophen, chlorproMAZINE (THORAZINE) injection, diphenhydrAMINE **OR** diphenhydrAMINE, hydrALAZINE, naloxone **AND** sodium chloride flush, ondansetron **OR** ondansetron (ZOFRAN) IV   PHYSICAL EXAM: Vital signs: Vitals:   03/18/18 0208 03/18/18 0340 03/18/18 0341 03/18/18 0805  BP:   (!) 148/81 (!) 162/87  Pulse:    (!) 122 (!) 123  Resp:  20 (!) 21 20  Temp:   98.8 F (37.1 C) 98.1 F (36.7 C)  TempSrc:   Oral Oral  SpO2:  94% 93% 94%  Weight: 84 kg     Height:       Filed Weights   03/16/18 0259 03/16/18 0915 03/18/18 0208  Weight: 76.2 kg 73.7 kg 84 kg   Body mass index is 30.8 kg/m.   General appearance:Awake, alert, not in any distress.  Eyes:no scleral icterus. HEENT: Atraumatic and Normocephalic Neck: supple, no JVD. Resp:Good air entry bilaterally, very few bibasilar rales but mostly clear CVS: S1 S2 regular, tachycardic GI: Bowel sounds present, distended and tender in the periumbilical and epigastric area.  No peritoneal signs.   Extremities: B/L Lower Ext shows no edema, both legs are warm to touch Neurology:  Non focal Psychiatric: Normal judgment and insight. Normal mood. Musculoskeletal:No digital cyanosis Skin:No Rash, warm and dry Wounds:N/A  I have personally reviewed following labs and imaging studies  LABORATORY DATA: CBC: Recent Labs  Lab 03/16/18 0300 03/17/18 0456 03/17/18 1414 03/18/18 0337  WBC 15.2* 14.9* 15.5* 12.6*  HGB 17.1* 18.3* 17.7* 17.0  HCT 51.5 55.6* 53.3* 52.1*  MCV 93.1 93.6 93.2 94.0  PLT 212 187 147* 130*    Basic Metabolic Panel: Recent Labs  Lab 03/16/18 0300 03/17/18 0456 03/17/18 1414 03/18/18 0337  NA 144 141 141 142  K 3.7 5.4* 4.3 4.9  CL 104 107 110 104  CO2 26 24 23 28   GLUCOSE 170* 196* 191* 199*  BUN 10 26* 25* 25*  CREATININE 1.06 1.27* 1.01 1.15  CALCIUM 9.1 8.1* 7.6* 8.1*    GFR: Estimated Creatinine Clearance: 62.2 mL/min (by C-G formula based on SCr of 1.15 mg/dL).  Liver Function Tests: Recent Labs  Lab 03/16/18 0300 03/17/18 0456 03/18/18 0337  AST 294* 78* 57*  ALT 197* 112* 65*  ALKPHOS 79 66 45  BILITOT 2.6* 1.5* 1.6*  PROT 6.6 5.6* 5.1*  ALBUMIN 4.0 3.3* 2.8*   Recent Labs  Lab 03/16/18 0300 03/18/18 0337  LIPASE 8,026* 934*   No results for input(s): AMMONIA in the last 168  hours.  Coagulation Profile: No results for input(s): INR, PROTIME in the last 168 hours.  Cardiac Enzymes: Recent Labs  Lab 03/16/18 0300  TROPONINI <0.03    BNP (last 3 results) No results for input(s): PROBNP in the last 8760 hours.  HbA1C: No results for input(s): HGBA1C in the last 72 hours.  CBG: Recent Labs  Lab 03/17/18 0934 03/17/18 1137 03/17/18 1635 03/17/18 2120 03/18/18 0606  GLUCAP 192* 175* 157* 196* 174*    Lipid Profile: No results for input(s): CHOL, HDL, LDLCALC, TRIG, CHOLHDL, LDLDIRECT in the last 72 hours.  Thyroid Function Tests: No results for input(s): TSH, T4TOTAL, FREET4, T3FREE, THYROIDAB in the last 72 hours.  Anemia Panel: No results for input(s): VITAMINB12, FOLATE, FERRITIN, TIBC, IRON, RETICCTPCT in the last 72 hours.  Urine analysis: No results found for: COLORURINE, APPEARANCEUR, LABSPEC, PHURINE, GLUCOSEU, HGBUR, BILIRUBINUR, KETONESUR, PROTEINUR, UROBILINOGEN, NITRITE, LEUKOCYTESUR  Sepsis Labs: Lactic Acid, Venous No results found for: LATICACIDVEN  MICROBIOLOGY: No results found for this or any  previous visit (from the past 240 hour(s)).  RADIOLOGY STUDIES/RESULTS: Ct Abdomen Pelvis W Contrast  Result Date: 03/16/2018 CLINICAL DATA:  67 y/o  M; epigastric pain. EXAM: CT ABDOMEN AND PELVIS WITH CONTRAST TECHNIQUE: Multidetector CT imaging of the abdomen and pelvis was performed using the standard protocol following bolus administration of intravenous contrast. CONTRAST:  100 cc Omnipaque 300 COMPARISON:  01/25/2008 CT of the abdomen and pelvis. FINDINGS: Lower chest: No acute abnormality. Hepatobiliary: Mild intra and extrahepatic biliary ductal dilatation with the common bile duct measuring up to 11 mm. No focal liver lesion. Cholelithiasis. Pancreas: Severe extensive edema surrounding the pancreas compatible with acute pancreatitis. No discrete acute peripancreatic collection. Spleen: Normal in size without focal abnormality.  Adrenals/Urinary Tract: Adrenal glands are unremarkable. Kidneys are normal, without renal calculi, focal lesion, or hydronephrosis. Bladder is unremarkable. Stomach/Bowel: Stomach is within normal limits. Mild wall thickening of the duodenum, likely reactive inflammatory changes. Appendix appears normal. No evidence of bowel wall thickening, distention, or inflammatory changes of the jejunum, ileum, or colon. Vascular/Lymphatic: Severe mixed atherosclerosis of the aorta. Bilateral common iliac and internal iliac artery aneurysms. The right common iliac artery aneurysm measures 2 cm and left 1.7 cm. No lymphadenopathy. Reproductive: Prostate is unremarkable. Other: No abdominal wall hernia or abnormality. No abdominopelvic ascites. Musculoskeletal: No fracture is seen. Moderate degenerative changes of the lumbar spine. IMPRESSION: 1. Acute pancreatitis with extensive retroperitoneal edema. No discrete acute peripancreatic collection identified at this time. Mild intra and extrahepatic biliary ductal dilatation with cholelithiasis may represent choledocholithiasis and gallstone pancreatitis. This can be further characterized with MRI/MRCP of the abdomen. 2. Severe aortic atherosclerosis with bilateral common iliac artery and internal iliac artery aneurysms. Electronically Signed   By: Mitzi Hansen M.D.   On: 03/16/2018 05:49   Mr 3d Recon At Scanner  Result Date: 03/17/2018 CLINICAL DATA:  Acute pancreatitis, etiology unknown. Cholelithiasis, diabetes and hepatitis C. EXAM: MRI ABDOMEN WITHOUT AND WITH CONTRAST (INCLUDING MRCP) TECHNIQUE: Multiplanar multisequence MR imaging of the abdomen was performed both before and after the administration of intravenous contrast. Heavily T2-weighted images of the biliary and pancreatic ducts were obtained, and three-dimensional MRCP images were rendered by post processing. CONTRAST:  15mL MULTIHANCE GADOBENATE DIMEGLUMINE 529 MG/ML IV SOLN COMPARISON:  Abdominal  CT 03/16/2018 and 01/25/2008. FINDINGS: Lower chest: New small bilateral pleural effusions with associated subsegmental atelectasis at both lung bases. Hepatobiliary: The liver is normal in appearance without morphologic changes of cirrhosis, focal lesion or abnormal enhancement. Large gallstones are again noted. The common hepatic duct measures 7 mm in diameter. There is no evidence of choledocholithiasis. Pancreas: There is moderate diffuse enlargement of the pancreas with extensive surrounding inflammatory changes. Prior to contrast, there is heterogeneous increased T1 signal throughout the pancreatic body and tail, suspicious for hemorrhage. Following contrast, there is very poor enhancement of the pancreas, especially in the body and tail. Portions of the pancreatic head enhance normally. There is no pancreatic ductal dilatation. Normal pancreatic ductal anatomy. Spleen: Normal in size without focal abnormality. Adrenals/Urinary Tract: Both adrenal glands appear normal. Small bilateral renal cysts. No evidence of renal mass or hydronephrosis. Stomach/Bowel: No evidence of bowel wall thickening or focal inflammation. Stable mild colonic dilatation, most consistent with an ileus. Vascular/Lymphatic: There are no enlarged abdominal lymph nodes. No significant vascular findings. The portal, superior mesenteric and splenic veins are patent. Other: There is a moderate amount of ascites. There is extensive inflammation and ill-defined fluid throughout the retroperitoneal and mesenteric fat. No well-defined fluid  collections are demonstrated. Musculoskeletal: No acute or significant osseous findings. IMPRESSION: 1. Worsening severe acute pancreatitis with suspicion of hemorrhagic components. There is a very poor enhancement of most of the pancreas, consistent with necrotizing pancreatitis and implying a poor prognosis. 2. Associated increased ascites, inflammation and ill-defined fluid throughout the intra-abdominal  fat, small bilateral pleural effusions and bibasilar atelectasis. 3. Cholelithiasis without evidence of significant biliary dilatation or choledocholithiasis. Electronically Signed   By: Carey BullocksWilliam  Veazey M.D.   On: 03/17/2018 10:41   Dg Chest Port 1 View  Result Date: 03/17/2018 CLINICAL DATA:  Short of breath and abdominal pain.  Pancreatitis. EXAM: PORTABLE CHEST 1 VIEW COMPARISON:  01/20/2018 FINDINGS: Midline trachea. Normal heart size for level of inspiration. Aortic atherosclerosis. Probable layering small left pleural effusion. No pneumothorax. Low lung volumes with resultant pulmonary interstitial prominence. Left greater than right base airspace disease is new. IMPRESSION: Low lung volumes with probable small left pleural effusion and bibasilar airspace disease. Airspace disease is favored to represent atelectasis. Especially at the left lung base, infection or aspiration cannot be excluded Aortic Atherosclerosis (ICD10-I70.0). Electronically Signed   By: Jeronimo GreavesKyle  Talbot M.D.   On: 03/17/2018 17:26   Dg Abd Portable 2v  Result Date: 03/18/2018 CLINICAL DATA:  History of hypertension. EXAM: PORTABLE ABDOMEN - 2 VIEW COMPARISON:  None. FINDINGS: Small pleural effusions. Air-filled loops of large and small bowel are identified with air to the level the rectum. There is no definitive dilatation. No free air, portal venous gas, or pneumatosis. No other acute abnormalities identified. IMPRESSION: Mildly prominent air-filled loops of large and small bowel without obstruction. A mild ileus is not excluded. Tiny pleural effusions. No other acute abnormalities. Electronically Signed   By: Gerome Samavid  Williams III M.D   On: 03/18/2018 09:18   Mr Abdomen Mrcp W Wo Contast  Result Date: 03/17/2018 CLINICAL DATA:  Acute pancreatitis, etiology unknown. Cholelithiasis, diabetes and hepatitis C. EXAM: MRI ABDOMEN WITHOUT AND WITH CONTRAST (INCLUDING MRCP) TECHNIQUE: Multiplanar multisequence MR imaging of the abdomen was  performed both before and after the administration of intravenous contrast. Heavily T2-weighted images of the biliary and pancreatic ducts were obtained, and three-dimensional MRCP images were rendered by post processing. CONTRAST:  15mL MULTIHANCE GADOBENATE DIMEGLUMINE 529 MG/ML IV SOLN COMPARISON:  Abdominal CT 03/16/2018 and 01/25/2008. FINDINGS: Lower chest: New small bilateral pleural effusions with associated subsegmental atelectasis at both lung bases. Hepatobiliary: The liver is normal in appearance without morphologic changes of cirrhosis, focal lesion or abnormal enhancement. Large gallstones are again noted. The common hepatic duct measures 7 mm in diameter. There is no evidence of choledocholithiasis. Pancreas: There is moderate diffuse enlargement of the pancreas with extensive surrounding inflammatory changes. Prior to contrast, there is heterogeneous increased T1 signal throughout the pancreatic body and tail, suspicious for hemorrhage. Following contrast, there is very poor enhancement of the pancreas, especially in the body and tail. Portions of the pancreatic head enhance normally. There is no pancreatic ductal dilatation. Normal pancreatic ductal anatomy. Spleen: Normal in size without focal abnormality. Adrenals/Urinary Tract: Both adrenal glands appear normal. Small bilateral renal cysts. No evidence of renal mass or hydronephrosis. Stomach/Bowel: No evidence of bowel wall thickening or focal inflammation. Stable mild colonic dilatation, most consistent with an ileus. Vascular/Lymphatic: There are no enlarged abdominal lymph nodes. No significant vascular findings. The portal, superior mesenteric and splenic veins are patent. Other: There is a moderate amount of ascites. There is extensive inflammation and ill-defined fluid throughout the retroperitoneal and mesenteric fat. No  well-defined fluid collections are demonstrated. Musculoskeletal: No acute or significant osseous findings. IMPRESSION:  1. Worsening severe acute pancreatitis with suspicion of hemorrhagic components. There is a very poor enhancement of most of the pancreas, consistent with necrotizing pancreatitis and implying a poor prognosis. 2. Associated increased ascites, inflammation and ill-defined fluid throughout the intra-abdominal fat, small bilateral pleural effusions and bibasilar atelectasis. 3. Cholelithiasis without evidence of significant biliary dilatation or choledocholithiasis. Electronically Signed   By: Carey BullocksWilliam  Veazey M.D.   On: 03/17/2018 10:41     LOS: 2 days   Jeoffrey MassedShanker Caelan Atchley, MD  [facility]  If 7PM-7AM, please contact night-coverage  Please page via [email or web address] [email or web address]-click on MD name and type text message  03/18/2018, 11:05 AM

## 2018-03-18 NOTE — Progress Notes (Signed)
Dilaudid PCA- wasted in sink, Tie Pataky RN witnessing

## 2018-03-18 NOTE — Consult Note (Signed)
   [facility] CM Inpatient Consult   03/18/2018  Venkat Ankney 1950/08/08 [id number]  Patient screened for potential [place] Management services. Patient is in the [place] of the [place] [facility] services under patient's [employer] plan.  Chart reviewed and reveals patient has his Transition of Care calls and followed by [facility].  Met with inpatient RNCM, Steffanie Dunn to make aware that [place] [facility] can follow if needs arise.  Patient is currently listed as low risk score for unplanned readmission.  Patient may need surgical intervention related to Gallstone Pancreatitis.    Please place a [facility] Care Management consult or for questions contact:   Natividad Brood, RN BSN [place] [facility] Liaison  [phone] business mobile phone Toll free office [phone]

## 2018-03-19 ENCOUNTER — Inpatient Hospital Stay (HOSPITAL_COMMUNITY): Payer: PPO

## 2018-03-19 LAB — GLUCOSE, CAPILLARY
GLUCOSE-CAPILLARY: 129 mg/dL — AB (ref 70–99)
Glucose-Capillary: 136 mg/dL — ABNORMAL HIGH (ref 70–99)
Glucose-Capillary: 145 mg/dL — ABNORMAL HIGH (ref 70–99)
Glucose-Capillary: 121 mg/dL — ABNORMAL HIGH (ref 70–99)

## 2018-03-19 LAB — COMPREHENSIVE METABOLIC PANEL
ALBUMIN: 2.5 g/dL — AB (ref 3.5–5.0)
ALT: 44 U/L (ref 0–44)
AST: 39 U/L (ref 15–41)
Alkaline Phosphatase: 39 U/L (ref 38–126)
Anion gap: 7 (ref 5–15)
BUN: 20 mg/dL (ref 8–23)
CHLORIDE: 103 mmol/L (ref 98–111)
CO2: 29 mmol/L (ref 22–32)
CREATININE: 0.75 mg/dL (ref 0.61–1.24)
Calcium: 7.6 mg/dL — ABNORMAL LOW (ref 8.9–10.3)
GFR calc Af Amer: >60 mL/min (ref >60)
GFR calc non Af Amer: >60 mL/min (ref >60)
GLUCOSE: 156 mg/dL — AB (ref 70–99)
POTASSIUM: 3.6 mmol/L (ref 3.5–5.1)
Sodium: 139 mmol/L (ref 135–145)
Total Bilirubin: 1.5 mg/dL — ABNORMAL HIGH (ref 0.3–1.2)
Total Protein: 4.9 g/dL — ABNORMAL LOW (ref 6.5–8.1)

## 2018-03-19 LAB — CBC
HCT: 46.1 % (ref 39.0–52.0)
HEMOGLOBIN: 15.4 g/dL (ref 13.0–17.0)
MCH: 30.6 pg (ref 26.0–34.0)
MCHC: 33.4 g/dL (ref 30.0–36.0)
MCV: 91.7 fL (ref 78.0–100.0)
PLATELETS: 153 10*3/uL (ref 150–400)
RBC: 5.03 MIL/uL (ref 4.22–5.81)
RDW: 13.7 % (ref 11.5–15.5)
WBC: 9.3 10*3/uL (ref 4.0–10.5)

## 2018-03-19 MED ORDER — VITAL AF 1.2 CAL PO LIQD
1000.0000 mL | ORAL | Status: DC
  Administered 2018-03-19 – 2018-03-21 (×2): 1000 mL
  Filled 2018-03-19: qty 1000
  Filled 2018-03-19 (×2): qty 1500

## 2018-03-19 MED ORDER — ALUM & MAG HYDROXIDE-SIMETH 200-200-20 MG/5ML PO SUSP
30.0000 mL | ORAL | Status: DC | PRN
  Administered 2018-03-19: 30 mL via ORAL
  Filled 2018-03-19: qty 30

## 2018-03-19 MED ORDER — POLYETHYLENE GLYCOL 3350 17 G PO PACK
17.0000 g | PACK | Freq: Every day | ORAL | Status: DC
  Administered 2018-03-19: 17 g via ORAL
  Filled 2018-03-19 (×4): qty 1

## 2018-03-19 MED ORDER — FAMOTIDINE IN NACL 20-0.9 MG/50ML-% IV SOLN
20.0000 mg | Freq: Two times a day (BID) | INTRAVENOUS | Status: DC
  Administered 2018-03-19 – 2018-03-22 (×8): 20 mg via INTRAVENOUS
  Filled 2018-03-19 (×9): qty 50

## 2018-03-19 MED ORDER — JEVITY 1.2 CAL PO LIQD: 1000.0000 mL | ORAL | Status: DC

## 2018-03-19 NOTE — Care Management Note (Signed)
Case Management Note Donn PieriniKristi Monna Crean RN, BSN Unit 4E- RN Care Coordinator  [phone]  Patient Details  Name: Ryan PeanCharles Dean Chan MRN: [id number] Date of Birth: 13-Sep-1950  Subjective/Objective:    Pt admitted with gallstone pancreatitis, plan for cortrak placement, and possible lap chole- surgery following.               Action/Plan: PTA pt lived at home, CM to follow for transition of care needs.   Expected Discharge Date:                  Expected Discharge Plan:     In-House Referral:     Discharge planning Services  CM Consult  Post Acute Care Choice:    Choice offered to:     DME Arranged:    DME Agency:     HH Arranged:    HH Agency:     Status of Service:  In process, will continue to follow  If discussed at Long Length of Stay Meetings, dates discussed:    Discharge Disposition:   Additional Comments:  Darrold SpanWebster, Danialle Dement Hall, RN 03/19/2018, 11:34 AM

## 2018-03-19 NOTE — Progress Notes (Signed)
PROGRESS NOTE        PATIENT DETAILS Name: Ryan PeanCharles Dean Brim Age: 67 y.o. Sex: male Date of Birth: 06-11-1951 Admit Date: 03/16/2018 Admitting Physician Jonah BlueJennifer Yates, MD JYN:WGNFAOPCP:System, Pcp Not In  Brief Narrative: Patient is a 67 y.o. male with a history of hypertension, dyslipidemia presented with acute gallstone pancreatitis, MRI on 8/14 suggestive of necrotizing pancreatitis.  Being managed with supportive care.  General surgery and GI currently following.  Subjective: Pain really not any better-some nausea this morning.  No hiccups.  Assessment/Plan: Acute necrotizing gallstone pancreatitis: Essentially unchanged.  Continues to have significant amount of pain requiring Dilaudid PCA.  Febrile to 101.1 F overnight.  Still with significant tenderness in the upper abdominal/periumbilical area.  Continue supportive care with IV fluids, PCA Dilaudid and other measures.  Due to fever-concern for pancreatic infection-but this could be simply from necrosis.  Remains empirically on meropenem-agree with general surgery that patient will probably need to be reimaged with a CT scan sometime early next week.  If he worsens or if he becomes septic, then probably will need to reimage sooner. Cortak tube inserted today, to begin postpyloric feeding. GI/general surgery following.  Mild pulmonary edema: Occurred on 8/14,secondary to aggressive IV fluid hydration in the setting of necrotizing pancreatitis.  Treated with 1 dose of IV Lasix, and decreasing IV fluids.  He is currently 14 L positive-once postpyloric feeding is up to goal, suspect we can decrease IV fluids further.  Volume overload: Secondary to acute illness, IV fluid resuscitation-as noted above he is +14 L, weight has increased to 188 pounds (168 pounds on admission).  Difficult situation-given severity of pancreatitis-I suspect we will need to tolerate some amount of volume overload at this time, he does not have  significant amount of pulmonary edema-once his clinical situation stabilizes a bit more-we can then begin diuresis.  Acute kidney injury: Likely hemodynamically mediated-in the setting of necrotizing pancreatitis.  Improved with supportive care   Mild hyperkalemia: Resolved.  Hypertension: BP relatively well controlled with IV metoprolol.   DVT Prophylaxis: Prophylactic Lovenox   Code Status: Full code   Family Communication: None at bedside  Disposition Plan: Remain inpatient-will require several days of hospitalization.  Antimicrobial agents: Anti-infectives (From admission, onward)   Start     Dose/Rate Route Frequency Ordered Stop   03/17/18 1600  meropenem (MERREM) 1 g in sodium chloride 0.9 % 100 mL IVPB     1 g 200 mL/hr over 30 Minutes Intravenous Every 8 hours 03/17/18 1507     03/17/18 1300  ampicillin-sulbactam (UNASYN) 1.5 g in sodium chloride 0.9 % 100 mL IVPB  Status:  Discontinued     1.5 g 200 mL/hr over 30 Minutes Intravenous Every 6 hours 03/17/18 1132 03/17/18 1440      Procedures: None  CONSULTS: GI CCS  Time spent: 38- minutes-Greater than 50% of this time was spent in counseling, explanation of diagnosis, planning of further management, and coordination of care.  MEDICATIONS: Scheduled Meds: . enoxaparin (LOVENOX) injection  40 mg Subcutaneous Q24H  . HYDROmorphone   Intravenous Q4H  . insulin aspart  0-15 Units Subcutaneous TID WC  . insulin aspart  0-5 Units Subcutaneous QHS  . metoprolol tartrate  2.5 mg Intravenous Q8H  . nicotine  21 mg Transdermal Daily   Continuous Infusions: . lactated ringers 125 mL/hr at 03/19/18 0517  .  meropenem (MERREM) IV 1 g (03/19/18 0958)   PRN Meds:.acetaminophen **OR** acetaminophen, alum & mag hydroxide-simeth, bisacodyl, chlorproMAZINE (THORAZINE) injection, diphenhydrAMINE **OR** diphenhydrAMINE, hydrALAZINE, naloxone **AND** sodium chloride flush, ondansetron **OR** ondansetron (ZOFRAN) IV,  polyethylene glycol   PHYSICAL EXAM: Vital signs: Vitals:   03/19/18 0331 03/19/18 0343 03/19/18 0345 03/19/18 0737  BP:  (!) 151/81  (!) 151/88  Pulse:  (!) 120  (!) 116  Resp:  17 15 (!) 22  Temp:  98.6 F (37 C)  97.8 F (36.6 C)  TempSrc:  Oral  Oral  SpO2: 92% 92% 92% 93%  Weight:  85.6 kg    Height:       Filed Weights   03/16/18 0915 03/18/18 0208 03/19/18 0343  Weight: 73.7 kg 84 kg 85.6 kg   Body mass index is 31.4 kg/m.   General appearance:Awake, alert, not in any distress.  Eyes:no scleral icterus. HEENT: Atraumatic and Normocephalic Neck: supple, no JVD. Resp:Good air entry bilaterally,no rales or rhonchi CVS: S1 S2 regular, no murmurs.  GI: Bowel sounds legs, still with significant tenderness mostly in the upper abdomen.  Extremities: B/L Lower Ext shows trace edema, both legs are warm to touch Neurology:  Non focal Psychiatric: Normal judgment and insight. Normal mood. Musculoskeletal:No digital cyanosis Skin:No Rash, warm and dry Wounds:N/A  I have personally reviewed following labs and imaging studies  LABORATORY DATA: CBC: Recent Labs  Lab 03/16/18 0300 03/17/18 0456 03/17/18 1414 03/18/18 0337 03/19/18 0718  WBC 15.2* 14.9* 15.5* 12.6* 9.3  HGB 17.1* 18.3* 17.7* 17.0 15.4  HCT 51.5 55.6* 53.3* 52.1* 46.1  MCV 93.1 93.6 93.2 94.0 91.7  PLT 212 187 147* 130* 153    Basic Metabolic Panel: Recent Labs  Lab 03/16/18 0300 03/17/18 0456 03/17/18 1414 03/18/18 0337 03/19/18 0718  NA 144 141 141 142 139  K 3.7 5.4* 4.3 4.9 3.6  CL 104 107 110 104 103  CO2 26 24 23 28 29   GLUCOSE 170* 196* 191* 199* 156*  BUN 10 26* 25* 25* 20  CREATININE 1.06 1.27* 1.01 1.15 0.75  CALCIUM 9.1 8.1* 7.6* 8.1* 7.6*    GFR: Estimated Creatinine Clearance: 90.1 mL/min (by C-G formula based on SCr of 0.75 mg/dL).  Liver Function Tests: Recent Labs  Lab 03/16/18 0300 03/17/18 0456 03/18/18 0337 03/19/18 0718  AST 294* 78* 57* 39  ALT 197* 112*  65* 44  ALKPHOS 79 66 45 39  BILITOT 2.6* 1.5* 1.6* 1.5*  PROT 6.6 5.6* 5.1* 4.9*  ALBUMIN 4.0 3.3* 2.8* 2.5*   Recent Labs  Lab 03/16/18 0300 03/18/18 0337  LIPASE 8,026* 934*   No results for input(s): AMMONIA in the last 168 hours.  Coagulation Profile: No results for input(s): INR, PROTIME in the last 168 hours.  Cardiac Enzymes: Recent Labs  Lab 03/16/18 0300  TROPONINI <0.03    BNP (last 3 results) No results for input(s): PROBNP in the last 8760 hours.  HbA1C: No results for input(s): HGBA1C in the last 72 hours.  CBG: Recent Labs  Lab 03/18/18 0606 03/18/18 1117 03/18/18 1643 03/18/18 2049 03/19/18 0610  GLUCAP 174* 164* 139* 123* 136*    Lipid Profile: No results for input(s): CHOL, HDL, LDLCALC, TRIG, CHOLHDL, LDLDIRECT in the last 72 hours.  Thyroid Function Tests: No results for input(s): TSH, T4TOTAL, FREET4, T3FREE, THYROIDAB in the last 72 hours.  Anemia Panel: No results for input(s): VITAMINB12, FOLATE, FERRITIN, TIBC, IRON, RETICCTPCT in the last 72 hours.  Urine analysis: No results  found for: COLORURINE, APPEARANCEUR, LABSPEC, PHURINE, GLUCOSEU, HGBUR, BILIRUBINUR, KETONESUR, PROTEINUR, UROBILINOGEN, NITRITE, LEUKOCYTESUR  Sepsis Labs: Lactic Acid, Venous No results found for: LATICACIDVEN  MICROBIOLOGY: No results found for this or any previous visit (from the past 240 hour(s)).  RADIOLOGY STUDIES/RESULTS: Ct Abdomen Pelvis W Contrast  Result Date: 03/16/2018 CLINICAL DATA:  67 y/o  M; epigastric pain. EXAM: CT ABDOMEN AND PELVIS WITH CONTRAST TECHNIQUE: Multidetector CT imaging of the abdomen and pelvis was performed using the standard protocol following bolus administration of intravenous contrast. CONTRAST:  100 cc Omnipaque 300 COMPARISON:  01/25/2008 CT of the abdomen and pelvis. FINDINGS: Lower chest: No acute abnormality. Hepatobiliary: Mild intra and extrahepatic biliary ductal dilatation with the common bile duct measuring  up to 11 mm. No focal liver lesion. Cholelithiasis. Pancreas: Severe extensive edema surrounding the pancreas compatible with acute pancreatitis. No discrete acute peripancreatic collection. Spleen: Normal in size without focal abnormality. Adrenals/Urinary Tract: Adrenal glands are unremarkable. Kidneys are normal, without renal calculi, focal lesion, or hydronephrosis. Bladder is unremarkable. Stomach/Bowel: Stomach is within normal limits. Mild wall thickening of the duodenum, likely reactive inflammatory changes. Appendix appears normal. No evidence of bowel wall thickening, distention, or inflammatory changes of the jejunum, ileum, or colon. Vascular/Lymphatic: Severe mixed atherosclerosis of the aorta. Bilateral common iliac and internal iliac artery aneurysms. The right common iliac artery aneurysm measures 2 cm and left 1.7 cm. No lymphadenopathy. Reproductive: Prostate is unremarkable. Other: No abdominal wall hernia or abnormality. No abdominopelvic ascites. Musculoskeletal: No fracture is seen. Moderate degenerative changes of the lumbar spine. IMPRESSION: 1. Acute pancreatitis with extensive retroperitoneal edema. No discrete acute peripancreatic collection identified at this time. Mild intra and extrahepatic biliary ductal dilatation with cholelithiasis may represent choledocholithiasis and gallstone pancreatitis. This can be further characterized with MRI/MRCP of the abdomen. 2. Severe aortic atherosclerosis with bilateral common iliac artery and internal iliac artery aneurysms. Electronically Signed   By: Mitzi HansenLance  Furusawa-Stratton M.D.   On: 03/16/2018 05:49   Mr 3d Recon At Scanner  Result Date: 03/17/2018 CLINICAL DATA:  Acute pancreatitis, etiology unknown. Cholelithiasis, diabetes and hepatitis C. EXAM: MRI ABDOMEN WITHOUT AND WITH CONTRAST (INCLUDING MRCP) TECHNIQUE: Multiplanar multisequence MR imaging of the abdomen was performed both before and after the administration of intravenous  contrast. Heavily T2-weighted images of the biliary and pancreatic ducts were obtained, and three-dimensional MRCP images were rendered by post processing. CONTRAST:  15mL MULTIHANCE GADOBENATE DIMEGLUMINE 529 MG/ML IV SOLN COMPARISON:  Abdominal CT 03/16/2018 and 01/25/2008. FINDINGS: Lower chest: New small bilateral pleural effusions with associated subsegmental atelectasis at both lung bases. Hepatobiliary: The liver is normal in appearance without morphologic changes of cirrhosis, focal lesion or abnormal enhancement. Large gallstones are again noted. The common hepatic duct measures 7 mm in diameter. There is no evidence of choledocholithiasis. Pancreas: There is moderate diffuse enlargement of the pancreas with extensive surrounding inflammatory changes. Prior to contrast, there is heterogeneous increased T1 signal throughout the pancreatic body and tail, suspicious for hemorrhage. Following contrast, there is very poor enhancement of the pancreas, especially in the body and tail. Portions of the pancreatic head enhance normally. There is no pancreatic ductal dilatation. Normal pancreatic ductal anatomy. Spleen: Normal in size without focal abnormality. Adrenals/Urinary Tract: Both adrenal glands appear normal. Small bilateral renal cysts. No evidence of renal mass or hydronephrosis. Stomach/Bowel: No evidence of bowel wall thickening or focal inflammation. Stable mild colonic dilatation, most consistent with an ileus. Vascular/Lymphatic: There are no enlarged abdominal lymph nodes. No significant vascular  findings. The portal, superior mesenteric and splenic veins are patent. Other: There is a moderate amount of ascites. There is extensive inflammation and ill-defined fluid throughout the retroperitoneal and mesenteric fat. No well-defined fluid collections are demonstrated. Musculoskeletal: No acute or significant osseous findings. IMPRESSION: 1. Worsening severe acute pancreatitis with suspicion of  hemorrhagic components. There is a very poor enhancement of most of the pancreas, consistent with necrotizing pancreatitis and implying a poor prognosis. 2. Associated increased ascites, inflammation and ill-defined fluid throughout the intra-abdominal fat, small bilateral pleural effusions and bibasilar atelectasis. 3. Cholelithiasis without evidence of significant biliary dilatation or choledocholithiasis. Electronically Signed   By: Carey Bullocks M.D.   On: 03/17/2018 10:41   Dg Chest Port 1 View  Result Date: 03/17/2018 CLINICAL DATA:  Short of breath and abdominal pain.  Pancreatitis. EXAM: PORTABLE CHEST 1 VIEW COMPARISON:  01/20/2018 FINDINGS: Midline trachea. Normal heart size for level of inspiration. Aortic atherosclerosis. Probable layering small left pleural effusion. No pneumothorax. Low lung volumes with resultant pulmonary interstitial prominence. Left greater than right base airspace disease is new. IMPRESSION: Low lung volumes with probable small left pleural effusion and bibasilar airspace disease. Airspace disease is favored to represent atelectasis. Especially at the left lung base, infection or aspiration cannot be excluded Aortic Atherosclerosis (ICD10-I70.0). Electronically Signed   By: Jeronimo Greaves M.D.   On: 03/17/2018 17:26   Dg Abd Portable 1v  Result Date: 03/19/2018 CLINICAL DATA:  Feeding tube placement EXAM: PORTABLE ABDOMEN - 1 VIEW COMPARISON:  03/18/2018 FINDINGS: Feeding tube has been placed with the tip in the distal descending duodenum. Diffuse gaseous distention of bowel, likely ileus, similar to prior study. IMPRESSION: Feeding tube tip in the distal descending duodenum. Electronically Signed   By: Charlett Nose M.D.   On: 03/19/2018 10:36   Dg Abd Portable 2v  Result Date: 03/18/2018 CLINICAL DATA:  History of hypertension. EXAM: PORTABLE ABDOMEN - 2 VIEW COMPARISON:  None. FINDINGS: Small pleural effusions. Air-filled loops of large and small bowel are identified  with air to the level the rectum. There is no definitive dilatation. No free air, portal venous gas, or pneumatosis. No other acute abnormalities identified. IMPRESSION: Mildly prominent air-filled loops of large and small bowel without obstruction. A mild ileus is not excluded. Tiny pleural effusions. No other acute abnormalities. Electronically Signed   By: Gerome Sam III M.D   On: 03/18/2018 09:18   Mr Abdomen Mrcp W Wo Contast  Result Date: 03/17/2018 CLINICAL DATA:  Acute pancreatitis, etiology unknown. Cholelithiasis, diabetes and hepatitis C. EXAM: MRI ABDOMEN WITHOUT AND WITH CONTRAST (INCLUDING MRCP) TECHNIQUE: Multiplanar multisequence MR imaging of the abdomen was performed both before and after the administration of intravenous contrast. Heavily T2-weighted images of the biliary and pancreatic ducts were obtained, and three-dimensional MRCP images were rendered by post processing. CONTRAST:  15mL MULTIHANCE GADOBENATE DIMEGLUMINE 529 MG/ML IV SOLN COMPARISON:  Abdominal CT 03/16/2018 and 01/25/2008. FINDINGS: Lower chest: New small bilateral pleural effusions with associated subsegmental atelectasis at both lung bases. Hepatobiliary: The liver is normal in appearance without morphologic changes of cirrhosis, focal lesion or abnormal enhancement. Large gallstones are again noted. The common hepatic duct measures 7 mm in diameter. There is no evidence of choledocholithiasis. Pancreas: There is moderate diffuse enlargement of the pancreas with extensive surrounding inflammatory changes. Prior to contrast, there is heterogeneous increased T1 signal throughout the pancreatic body and tail, suspicious for hemorrhage. Following contrast, there is very poor enhancement of the pancreas, especially in the  body and tail. Portions of the pancreatic head enhance normally. There is no pancreatic ductal dilatation. Normal pancreatic ductal anatomy. Spleen: Normal in size without focal abnormality.  Adrenals/Urinary Tract: Both adrenal glands appear normal. Small bilateral renal cysts. No evidence of renal mass or hydronephrosis. Stomach/Bowel: No evidence of bowel wall thickening or focal inflammation. Stable mild colonic dilatation, most consistent with an ileus. Vascular/Lymphatic: There are no enlarged abdominal lymph nodes. No significant vascular findings. The portal, superior mesenteric and splenic veins are patent. Other: There is a moderate amount of ascites. There is extensive inflammation and ill-defined fluid throughout the retroperitoneal and mesenteric fat. No well-defined fluid collections are demonstrated. Musculoskeletal: No acute or significant osseous findings. IMPRESSION: 1. Worsening severe acute pancreatitis with suspicion of hemorrhagic components. There is a very poor enhancement of most of the pancreas, consistent with necrotizing pancreatitis and implying a poor prognosis. 2. Associated increased ascites, inflammation and ill-defined fluid throughout the intra-abdominal fat, small bilateral pleural effusions and bibasilar atelectasis. 3. Cholelithiasis without evidence of significant biliary dilatation or choledocholithiasis. Electronically Signed   By: Carey Bullocks M.D.   On: 03/17/2018 10:41     LOS: 3 days   Jeoffrey Massed, MD  [facility]  If 7PM-7AM, please contact night-coverage  Please page via [email or web address] [email or web address]-click on MD name and type text message  03/19/2018, 10:50 AM

## 2018-03-19 NOTE — Progress Notes (Addendum)
Patient ID: Ryan Chan, male   DOB: [birth year], 67 y.o.   MRN: [id number]    Progress Note   Subjective   Day # 4 hospital stay  Meropenum started 8/13   Temp 101.1 last pm   Up to commode currently - passing a lot of flatus , no BM so far  Abdomen" tight" Feels about the same - tolerating some full liquids without vomiting or  Increase in pain Still requiring PCA for pain regularly    Objective   Vital signs in last 24 hours: Temp:  [97.8 F (36.6 C)-101.1 F (38.4 C)] 97.8 F (36.6 C) (08/16 0737) Pulse Rate:  [115-131] 116 (08/16 0737) Resp:  [15-25] 22 (08/16 0737) BP: (131-172)/(70-88) 151/88 (08/16 0737) SpO2:  [92 %-95 %] 93 % (08/16 0737) Weight:  [85.6 kg] 85.6 kg (08/16 0343) Last BM Date: 03/16/18 General:    Older white male  in NAD, ill and uncomfortable  Appearing Nasal tube in  Heart:  tachyRegular rate and rhythm; no murmurs Lungs: Respirations even decreased BS bases  Abdomen: ,distended BS quiet , tender diffusely across upper abd Extremities:  Without edema. Neurologic:  Alert and oriented,  grossly normal neurologically. Psych:  Cooperative. Normal mood and affect.  Intake/Output from previous day: 08/15 0701 - 08/16 0700 In: 3554.5 [P.O.:600; I.V.:2654.5; IV Piggyback:300] Out: 650 [Urine:650] Intake/Output this shift: No intake/output data recorded.  Lab Results: Recent Labs    03/17/18 1414 03/18/18 0337 03/19/18 0718  WBC 15.5* 12.6* 9.3  HGB 17.7* 17.0 15.4  HCT 53.3* 52.1* 46.1  PLT 147* 130* 153   BMET Recent Labs    03/17/18 1414 03/18/18 0337 03/19/18 0718  NA 141 142 139  K 4.3 4.9 3.6  CL 110 104 103  CO2 23 28 29   GLUCOSE 191* 199* 156*  BUN 25* 25* 20  CREATININE 1.01 1.15 0.75  CALCIUM 7.6* 8.1* 7.6*   LFT Recent Labs    03/19/18 0718  PROT 4.9*  ALBUMIN 2.5*  AST 39  ALT 44  ALKPHOS 39  BILITOT 1.5*   PT/INR No results for input(s): LABPROT, INR in the last 72 hours.  Studies/Results: Dg  Chest Port 1 View  Result Date: 03/17/2018 CLINICAL DATA:  Short of breath and abdominal pain.  Pancreatitis. EXAM: PORTABLE CHEST 1 VIEW COMPARISON:  01/20/2018 FINDINGS: Midline trachea. Normal heart size for level of inspiration. Aortic atherosclerosis. Probable layering small left pleural effusion. No pneumothorax. Low lung volumes with resultant pulmonary interstitial prominence. Left greater than right base airspace disease is new. IMPRESSION: Low lung volumes with probable small left pleural effusion and bibasilar airspace disease. Airspace disease is favored to represent atelectasis. Especially at the left lung base, infection or aspiration cannot be excluded Aortic Atherosclerosis (ICD10-I70.0). Electronically Signed   By: Jeronimo GreavesKyle  Talbot M.D.   On: 03/17/2018 17:26   Dg Abd Portable 2v  Result Date: 03/18/2018 CLINICAL DATA:  History of hypertension. EXAM: PORTABLE ABDOMEN - 2 VIEW COMPARISON:  None. FINDINGS: Small pleural effusions. Air-filled loops of large and small bowel are identified with air to the level the rectum. There is no definitive dilatation. No free air, portal venous gas, or pneumatosis. No other acute abnormalities identified. IMPRESSION: Mildly prominent air-filled loops of large and small bowel without obstruction. A mild ileus is not excluded. Tiny pleural effusions. No other acute abnormalities. Electronically Signed   By: Gerome Samavid  Williams III M.D   On: 03/18/2018 09:18       Assessment / Plan:     #  1 67 yo WM with acute severe gallstone pancreatitis- passed CBD stone  Unfortunately MRI showed severe pancreatitis with concern for hemorrhagic component and necrosis    Labs  Much improved  Febrile last pm - ? Necrosis  Related? Atelectasis - follow  On Meropenum  Will need re-imaging  Monday   Continue full liquids as tolerates  Feeding tube placed - to start post pyloric feeding vital AF 20 cc /hr , and titrate up to goal 80 cc /hr  #2 Mild  Pulm edema / volume  overload - per primary team #3 AKI - resolved  #4 cholelithiasis- suspect will need to wait  6-8 weeks post resolution of severe pancreatitis before cholecystectomy        Contact  Ryan Chan, P.A.-C               [phone]      Principal Problem:   Gallstone pancreatitis Active Problems:   Elevated LFTs   Hepatitis C   Essential hypertension   Diet-controlled diabetes mellitus (HCC)     LOS: 3 days   Ryan Chan  03/19/2018, 10:19 AM

## 2018-03-19 NOTE — Progress Notes (Signed)
2 Days Post-Op   Subjective/Chief Complaint: Some better, some flatus, no n/v, still hurts   Objective: Vital signs in last 24 hours: Temp:  [97.8 F (36.6 C)-101.1 F (38.4 C)] 97.8 F (36.6 C) (08/16 0737) Pulse Rate:  [115-131] 116 (08/16 0737) Resp:  [15-25] 22 (08/16 0737) BP: (131-172)/(70-88) 151/88 (08/16 0737) SpO2:  [92 %-95 %] 93 % (08/16 0737) Weight:  [85.6 kg] 85.6 kg (08/16 0343) Last BM Date: 03/16/18  Intake/Output from previous day: 08/15 0701 - 08/16 0700 In: 3554.5 [P.O.:600; I.V.:2654.5; IV Piggyback:300] Out: 650 [Urine:650] Intake/Output this shift: No intake/output data recorded.  GI: distended (but less), few bs, tender upper abdomen unchanged  Lab Results:  Recent Labs    03/18/18 0337 03/19/18 0718  WBC 12.6* 9.3  HGB 17.0 15.4  HCT 52.1* 46.1  PLT 130* 153   BMET Recent Labs    03/17/18 1414 03/18/18 0337  NA 141 142  K 4.3 4.9  CL 110 104  CO2 23 28  GLUCOSE 191* 199*  BUN 25* 25*  CREATININE 1.01 1.15  CALCIUM 7.6* 8.1*   PT/INR No results for input(s): LABPROT, INR in the last 72 hours. ABG No results for input(s): PHART, HCO3 in the last 72 hours.  Invalid input(s): PCO2, PO2  Studies/Results: Mr 3d Recon At Scanner  Result Date: 03/17/2018 CLINICAL DATA:  Acute pancreatitis, etiology unknown. Cholelithiasis, diabetes and hepatitis C. EXAM: MRI ABDOMEN WITHOUT AND WITH CONTRAST (INCLUDING MRCP) TECHNIQUE: Multiplanar multisequence MR imaging of the abdomen was performed both before and after the administration of intravenous contrast. Heavily T2-weighted images of the biliary and pancreatic ducts were obtained, and three-dimensional MRCP images were rendered by post processing. CONTRAST:  15mL MULTIHANCE GADOBENATE DIMEGLUMINE 529 MG/ML IV SOLN COMPARISON:  Abdominal CT 03/16/2018 and 01/25/2008. FINDINGS: Lower chest: New small bilateral pleural effusions with associated subsegmental atelectasis at both lung bases.  Hepatobiliary: The liver is normal in appearance without morphologic changes of cirrhosis, focal lesion or abnormal enhancement. Large gallstones are again noted. The common hepatic duct measures 7 mm in diameter. There is no evidence of choledocholithiasis. Pancreas: There is moderate diffuse enlargement of the pancreas with extensive surrounding inflammatory changes. Prior to contrast, there is heterogeneous increased T1 signal throughout the pancreatic body and tail, suspicious for hemorrhage. Following contrast, there is very poor enhancement of the pancreas, especially in the body and tail. Portions of the pancreatic head enhance normally. There is no pancreatic ductal dilatation. Normal pancreatic ductal anatomy. Spleen: Normal in size without focal abnormality. Adrenals/Urinary Tract: Both adrenal glands appear normal. Small bilateral renal cysts. No evidence of renal mass or hydronephrosis. Stomach/Bowel: No evidence of bowel wall thickening or focal inflammation. Stable mild colonic dilatation, most consistent with an ileus. Vascular/Lymphatic: There are no enlarged abdominal lymph nodes. No significant vascular findings. The portal, superior mesenteric and splenic veins are patent. Other: There is a moderate amount of ascites. There is extensive inflammation and ill-defined fluid throughout the retroperitoneal and mesenteric fat. No well-defined fluid collections are demonstrated. Musculoskeletal: No acute or significant osseous findings. IMPRESSION: 1. Worsening severe acute pancreatitis with suspicion of hemorrhagic components. There is a very poor enhancement of most of the pancreas, consistent with necrotizing pancreatitis and implying a poor prognosis. 2. Associated increased ascites, inflammation and ill-defined fluid throughout the intra-abdominal fat, small bilateral pleural effusions and bibasilar atelectasis. 3. Cholelithiasis without evidence of significant biliary dilatation or  choledocholithiasis. Electronically Signed   By: Carey BullocksWilliam  Veazey M.D.   On: 03/17/2018 10:41  Dg Chest Port 1 View  Result Date: 03/17/2018 CLINICAL DATA:  Short of breath and abdominal pain.  Pancreatitis. EXAM: PORTABLE CHEST 1 VIEW COMPARISON:  01/20/2018 FINDINGS: Midline trachea. Normal heart size for level of inspiration. Aortic atherosclerosis. Probable layering small left pleural effusion. No pneumothorax. Low lung volumes with resultant pulmonary interstitial prominence. Left greater than right base airspace disease is new. IMPRESSION: Low lung volumes with probable small left pleural effusion and bibasilar airspace disease. Airspace disease is favored to represent atelectasis. Especially at the left lung base, infection or aspiration cannot be excluded Aortic Atherosclerosis (ICD10-I70.0). Electronically Signed   By: Jeronimo Greaves M.D.   On: 03/17/2018 17:26   Dg Abd Portable 2v  Result Date: 03/18/2018 CLINICAL DATA:  History of hypertension. EXAM: PORTABLE ABDOMEN - 2 VIEW COMPARISON:  None. FINDINGS: Small pleural effusions. Air-filled loops of large and small bowel are identified with air to the level the rectum. There is no definitive dilatation. No free air, portal venous gas, or pneumatosis. No other acute abnormalities identified. IMPRESSION: Mildly prominent air-filled loops of large and small bowel without obstruction. A mild ileus is not excluded. Tiny pleural effusions. No other acute abnormalities. Electronically Signed   By: Gerome Sam III M.D   On: 03/18/2018 09:18   Mr Abdomen Mrcp W Wo Contast  Result Date: 03/17/2018 CLINICAL DATA:  Acute pancreatitis, etiology unknown. Cholelithiasis, diabetes and hepatitis C. EXAM: MRI ABDOMEN WITHOUT AND WITH CONTRAST (INCLUDING MRCP) TECHNIQUE: Multiplanar multisequence MR imaging of the abdomen was performed both before and after the administration of intravenous contrast. Heavily T2-weighted images of the biliary and pancreatic  ducts were obtained, and three-dimensional MRCP images were rendered by post processing. CONTRAST:  15mL MULTIHANCE GADOBENATE DIMEGLUMINE 529 MG/ML IV SOLN COMPARISON:  Abdominal CT 03/16/2018 and 01/25/2008. FINDINGS: Lower chest: New small bilateral pleural effusions with associated subsegmental atelectasis at both lung bases. Hepatobiliary: The liver is normal in appearance without morphologic changes of cirrhosis, focal lesion or abnormal enhancement. Large gallstones are again noted. The common hepatic duct measures 7 mm in diameter. There is no evidence of choledocholithiasis. Pancreas: There is moderate diffuse enlargement of the pancreas with extensive surrounding inflammatory changes. Prior to contrast, there is heterogeneous increased T1 signal throughout the pancreatic body and tail, suspicious for hemorrhage. Following contrast, there is very poor enhancement of the pancreas, especially in the body and tail. Portions of the pancreatic head enhance normally. There is no pancreatic ductal dilatation. Normal pancreatic ductal anatomy. Spleen: Normal in size without focal abnormality. Adrenals/Urinary Tract: Both adrenal glands appear normal. Small bilateral renal cysts. No evidence of renal mass or hydronephrosis. Stomach/Bowel: No evidence of bowel wall thickening or focal inflammation. Stable mild colonic dilatation, most consistent with an ileus. Vascular/Lymphatic: There are no enlarged abdominal lymph nodes. No significant vascular findings. The portal, superior mesenteric and splenic veins are patent. Other: There is a moderate amount of ascites. There is extensive inflammation and ill-defined fluid throughout the retroperitoneal and mesenteric fat. No well-defined fluid collections are demonstrated. Musculoskeletal: No acute or significant osseous findings. IMPRESSION: 1. Worsening severe acute pancreatitis with suspicion of hemorrhagic components. There is a very poor enhancement of most of the  pancreas, consistent with necrotizing pancreatitis and implying a poor prognosis. 2. Associated increased ascites, inflammation and ill-defined fluid throughout the intra-abdominal fat, small bilateral pleural effusions and bibasilar atelectasis. 3. Cholelithiasis without evidence of significant biliary dilatation or choledocholithiasis. Electronically Signed   By: Carey Bullocks M.D.   On: 03/17/2018 10:41  Anti-infectives: Anti-infectives (From admission, onward)   Start     Dose/Rate Route Frequency Ordered Stop   03/17/18 1600  meropenem (MERREM) 1 g in sodium chloride 0.9 % 100 mL IVPB     1 g 200 mL/hr over 30 Minutes Intravenous Every 8 hours 03/17/18 1507     03/17/18 1300  ampicillin-sulbactam (UNASYN) 1.5 g in sodium chloride 0.9 % 100 mL IVPB  Status:  Discontinued     1.5 g 200 mL/hr over 30 Minutes Intravenous Every 6 hours 03/17/18 1132 03/17/18 1440      Assessment/Plan: HTN HLD Tobacco abuse - smokes 2 PPD DM - per patient well controlled with diet, not on any medications Hepatitis C - never received treatment Severe aortic atherosclerosis Gallstone pancreatitis - no prior h/o abdominal surgery - CT scan 8/13 showed acute pancreatitis with extensive retroperitoneal edema, no discrete acute peripancreatic collection, mild intraand extrahepatic biliary ductal dilatation with cholelithiasis - MRCP 8/14 showed no choledocholithiasis, but did show worsening pancreatitis compared to CT scan with possible hemorrhagic component or necrotizing pancreatitis -resolving slowly, I think best plan will be repeating ct next week. Im not sure with severe episode he will be able to have lap chole this admission.   We will plan on seeing Monday again. Please call this weekend for questions.  ID -[id number] 8/14>> VTE -SCDs, lovenox FEN -IVF, NPO Foley -none   Emelia LoronMatthew Avaline Stillson 03/19/2018

## 2018-03-19 NOTE — Progress Notes (Signed)
Initial Nutrition Assessment  DOCUMENTATION CODES:   Obesity unspecified  INTERVENTION:    Initiate TF via Cortrak feeding tube with Vital AF 1.2 at 20 ml/hr and increase by 10 ml every 6 hours to goal rate of 80 ml/hr.   At goal rate TF provides 2304 kcals, 144 gm protein, 1557 ml free water daily  NUTRITION DIAGNOSIS:   Inadequate oral intake related to altered GI function(necrotizing pancreatitis) as evidenced by NPO status.  To start TF  GOAL:   Patient will meet greater than or equal to 90% of their needs  Not meeting  MONITOR:   TF tolerance, Labs, Skin, Weight trends, I & O's  REASON FOR ASSESSMENT:   Consult Enteral/tube feeding initiation and management  ASSESSMENT:   67 yo Male with PMH of dyslipidemia presented with acute gallstone pancreatitis, MRI on 8/14 suggestive of necrotizing pancreatitis.     8/16- cortrak placed  Pt's abdomen continues to be distended. He is tolerating sips of full liquids without pain or nausea. Cortrak placed this afternoon, tip shows to be in the descending part of the duodenum. CCS and internal medicine okay to start TF. Will begin trickle and advance slowly. Weight noted to increase 20 lb since being admitted (168 lb on 8/13 to 188 lb today).   Pt continues to be volume overloaded. Plan to diuresis once he is clinically stable.   Medications reviewed and include: LR @ 125 ml/hr Labs reviewed.   Diet Order:   Diet Order            Diet full liquid Room service appropriate? Yes; Fluid consistency: Thin  Diet effective now              EDUCATION NEEDS:   Not appropriate for education at this time  Skin:  Skin Assessment: Reviewed RN Assessment  Last BM:  03/15/18  Height:   Ht Readings from Last 1 Encounters:  03/16/18 5\' 5"  (1.651 m)    Weight:   Wt Readings from Last 1 Encounters:  03/19/18 85.6 kg    Ideal Body Weight:  61.8 kg  BMI:  Body mass index is 31.4 kg/m.  Estimated Nutritional Needs:    Kcal:  2300-2500  Protein:  130-145 gm  Fluid:  2.3-2.5 L   Vanessa Kickarly Trevon Strothers RD, LDN Clinical Nutrition Pager # [phone]- [phone]

## 2018-03-19 NOTE — Progress Notes (Signed)
Cortrak Tube Team Note:  Consult received to place a Cortrak feeding tube.   A 10 F Cortrak tube was placed in the RIGHT nare and secured with a nasal bridle at 100 cm. Per the Cortrak monitor reading the tube tip is post pyloric.   X-ray is required, abdominal x-ray has been ordered by the Cortrak team. Please confirm tube placement before using the Cortrak tube.   If the tube becomes dislodged please keep the tube and contact the Cortrak team at [email or web address] (password [email or web address]) for replacement.  If after hours and replacement cannot be delayed, place a NG tube and confirm placement with an abdominal x-ray.    Vanessa Kickarly Davanta Meuser RD, LDN Clinical Nutrition Pager # [phone]- [phone]

## 2018-03-20 DIAGNOSIS — K851 Biliary acute pancreatitis without necrosis or infection: Secondary | ICD-10-CM

## 2018-03-20 DIAGNOSIS — E8770 Fluid overload, unspecified: Secondary | ICD-10-CM

## 2018-03-20 LAB — CBC
HCT: 44.6 % (ref 39.0–52.0)
Hemoglobin: 15.1 g/dL (ref 13.0–17.0)
MCH: 31.3 pg (ref 26.0–34.0)
MCHC: 33.9 g/dL (ref 30.0–36.0)
MCV: 92.3 fL (ref 78.0–100.0)
PLATELETS: 179 10*3/uL (ref 150–400)
RBC: 4.83 MIL/uL (ref 4.22–5.81)
RDW: 13.5 % (ref 11.5–15.5)
WBC: 12.4 10*3/uL — AB (ref 4.0–10.5)

## 2018-03-20 LAB — COMPREHENSIVE METABOLIC PANEL
ALT: 37 U/L (ref 0–44)
AST: 37 U/L (ref 15–41)
Albumin: 2.4 g/dL — ABNORMAL LOW (ref 3.5–5.0)
Alkaline Phosphatase: 45 U/L (ref 38–126)
Anion gap: 9 (ref 5–15)
BUN: 17 mg/dL (ref 8–23)
CHLORIDE: 102 mmol/L (ref 98–111)
CO2: 28 mmol/L (ref 22–32)
Calcium: 7.7 mg/dL — ABNORMAL LOW (ref 8.9–10.3)
Creatinine, Ser: 0.71 mg/dL (ref 0.61–1.24)
GFR calc Af Amer: >60 mL/min (ref >60)
Glucose, Bld: 170 mg/dL — ABNORMAL HIGH (ref 70–99)
Potassium: 3.7 mmol/L (ref 3.5–5.1)
SODIUM: 139 mmol/L (ref 135–145)
Total Bilirubin: 1.3 mg/dL — ABNORMAL HIGH (ref 0.3–1.2)
Total Protein: 4.9 g/dL — ABNORMAL LOW (ref 6.5–8.1)

## 2018-03-20 LAB — GLUCOSE, CAPILLARY
GLUCOSE-CAPILLARY: 174 mg/dL — AB (ref 70–99)
GLUCOSE-CAPILLARY: 182 mg/dL — AB (ref 70–99)
Glucose-Capillary: 126 mg/dL — ABNORMAL HIGH (ref 70–99)
Glucose-Capillary: 165 mg/dL — ABNORMAL HIGH (ref 70–99)

## 2018-03-20 LAB — MAGNESIUM: MAGNESIUM: 2.3 mg/dL (ref 1.7–2.4)

## 2018-03-20 LAB — PHOSPHORUS: Phosphorus: 1.2 mg/dL — ABNORMAL LOW (ref 2.5–4.6)

## 2018-03-20 LAB — C-REACTIVE PROTEIN: CRP: 17.1 mg/dL — AB (ref <1.0)

## 2018-03-20 MED ORDER — METOPROLOL TARTRATE 5 MG/5ML IV SOLN
5.0000 mg | Freq: Three times a day (TID) | INTRAVENOUS | Status: DC
  Administered 2018-03-20 – 2018-03-21 (×3): 5 mg via INTRAVENOUS
  Filled 2018-03-20 (×3): qty 5

## 2018-03-20 MED ORDER — FUROSEMIDE 10 MG/ML IJ SOLN
40.0000 mg | Freq: Two times a day (BID) | INTRAMUSCULAR | Status: DC
  Administered 2018-03-20 (×2): 40 mg via INTRAVENOUS
  Filled 2018-03-20 (×2): qty 4

## 2018-03-20 MED ORDER — HYDROMORPHONE HCL 1 MG/ML IJ SOLN
1.0000 mg | INTRAMUSCULAR | Status: DC | PRN
  Administered 2018-03-20 – 2018-03-24 (×13): 1 mg via INTRAVENOUS
  Filled 2018-03-20 (×13): qty 1

## 2018-03-20 MED ORDER — SENNOSIDES-DOCUSATE SODIUM 8.6-50 MG PO TABS: 2.0000 | ORAL_TABLET | Freq: Every evening | ORAL | Status: DC | PRN

## 2018-03-20 MED ORDER — OXYCODONE HCL 5 MG PO TABS
5.0000 mg | ORAL_TABLET | ORAL | Status: DC | PRN
  Administered 2018-03-20 – 2018-03-26 (×10): 5 mg via ORAL
  Filled 2018-03-20 (×10): qty 1

## 2018-03-20 MED ORDER — POTASSIUM CHLORIDE CRYS ER 20 MEQ PO TBCR
20.0000 meq | EXTENDED_RELEASE_TABLET | Freq: Every day | ORAL | Status: DC
  Administered 2018-03-20 – 2018-03-25 (×6): 20 meq via ORAL
  Filled 2018-03-20 (×7): qty 1

## 2018-03-20 MED ORDER — POLYETHYLENE GLYCOL 3350 17 G PO PACK: 17.0000 g | PACK | Freq: Every day | ORAL | Status: DC

## 2018-03-20 NOTE — Progress Notes (Signed)
PROGRESS NOTE        PATIENT DETAILS Name: Ryan PeanCharles Dean Chan Age: 67 y.o. Sex: male Date of Birth: February 04, 1951 Admit Date: 03/16/2018 Admitting Physician Jonah BlueJennifer Yates, MD JWJ:XBJYNWPCP:System, Pcp Not In  Brief Narrative: Patient is a 67 y.o. male with a history of hypertension, dyslipidemia presented with acute gallstone pancreatitis, MRI on 8/14 suggestive of necrotizing pancreatitis.  Being managed with supportive care.  General surgery and GI currently following.  Subjective: He feels much better this morning-claims that pain has improved overnight.  No nausea-had a BM last night.  His main issue is he feels very volume overloaded.  Assessment/Plan: Acute necrotizing gallstone pancreatitis: Seems to have improved much more compared to yesterday.  His abdominal exam does not show any significant tenderness compared to yesterday.  Tolerating postpyloric feeding via NG tube.  He also has been afebrile overnight-last fever episode was 8/15 evening.  We will continue with supportive care-postpyloric feeding.  Since his pain has markedly improved-stop Dilaudid PCA and transition to as needed narcotics.  He is significantly volume overloaded-hence will KVO all IV fluids, continue postpyloric feeding but will go ahead and start him on IV Lasix.  GI, general surgery following with plans to reimage with a CT scan sometime early next week, sooner if he worsens.  Volume overload: Is currently 15 L positive, this is from aggressive IV fluid resuscitation in the setting of necrotizing pancreatitis.  193 pounds (168 pounds on admission).  Now that he has improved, and tolerating postpyloric feeding-we will go ahead and stop all IV fluids, and start him on Lasix 40 mg IV twice daily.  Follow weights, intake output, electrolytes.  Reviewed [facility]sthor[facility] June 2019 showed a preserved EF.  He also had a negative stress test in June 2019 as  well.  Acute kidney injury:, Hemodynamically mediated in the setting of necrotizing pancreatitis.    Mild hyperkalemia: Resolved.  Hypertension: BP uncontrolled, and will increase IV metoprolol to 5 mg every 8 hours-hopefully with stopping IV fluids blood pressure will improve further.  If clinical improvement continues-I suspect we could switch him to oral antihypertensives tomorrow.    DVT Prophylaxis: Prophylactic Lovenox   Code Status: Full code   Family Communication: None at bedside  Disposition Plan: Remain inpatient-will require several days of hospitalization.  Antimicrobial agents: Anti-infectives (From admission, onward)   Start     Dose/Rate Route Frequency Ordered Stop   03/17/18 1600  meropenem (MERREM) 1 g in sodium chloride 0.9 % 100 mL IVPB     1 g 200 mL/hr over 30 Minutes Intravenous Every 8 hours 03/17/18 1507     03/17/18 1300  ampicillin-sulbactam (UNASYN) 1.5 g in sodium chloride 0.9 % 100 mL IVPB  Status:  Discontinued     1.5 g 200 mL/hr over 30 Minutes Intravenous Every 6 hours 03/17/18 1132 03/17/18 1440      Procedures: None  CONSULTS: GI CCS  Time spent: 35 minutes-Greater than 50% of this time was spent in counseling, explanation of diagnosis, planning of further management, and coordination of care.  MEDICATIONS: Scheduled Meds: . enoxaparin (LOVENOX) injection  40 mg Subcutaneous Q24H  . furosemide  40 mg Intravenous BID  . insulin aspart  0-15 Units Subcutaneous TID WC  . insulin aspart  0-5 Units Subcutaneous QHS  . metoprolol tartrate  2.5 mg Intravenous Q8H  .  nicotine  21 mg Transdermal Daily  . polyethylene glycol  17 g Oral Daily   Continuous Infusions: . famotidine (PEPCID) IV Stopped (03/19/18 2126)  . feeding supplement (VITAL AF 1.2 CAL) 40 mL/hr at 03/20/18 0652  . lactated ringers 60 mL/hr at 03/20/18 0701  . meropenem (MERREM) IV 1 g (03/20/18 0026)   PRN Meds:.acetaminophen **OR** acetaminophen, alum & mag  hydroxide-simeth, bisacodyl, chlorproMAZINE (THORAZINE) injection, hydrALAZINE, HYDROmorphone (DILAUDID) injection, ondansetron **OR** ondansetron (ZOFRAN) IV, oxyCODONE, senna-docusate   PHYSICAL EXAM: Vital signs: Vitals:   03/20/18 0042 03/20/18 0354 03/20/18 0542 03/20/18 0543  BP: (!) 165/80  (!) 170/86   Pulse:   (!) 115   Resp:   (!) 25 20  Temp:   97.9 F (36.6 C)   TempSrc:   Oral   SpO2:   93% 93%  Weight:  87.8 kg    Height:       Filed Weights   03/18/18 0208 03/19/18 0343 03/20/18 0354  Weight: 84 kg 85.6 kg 87.8 kg   Body mass index is 32.22 kg/m.  General appearance:Awake, alert, not in any distress.  Eyes:no scleral icterus. HEENT: Atraumatic and Normocephalic Neck: supple, no JVD. Resp:Good air entry bilaterally,no rales or rhonchi CVS: S1 S2 regular, no murmurs.  GI: Bowel sounds present, mildly tender in the upper abdomen but markedly better than yesterday.  Somewhat distended.   Extremities: B/L Lower Ext shows + edema, both legs are warm to touch Neurology:  Non focal Psychiatric: Normal judgment and insight. Normal mood. Musculoskeletal:No digital cyanosis Skin:No Rash, warm and dry Wounds:N/A  I have personally reviewed following labs and imaging studies  LABORATORY DATA: CBC: Recent Labs  Lab 03/17/18 0456 03/17/18 1414 03/18/18 0337 03/19/18 0718 03/20/18 0348  WBC 14.9* 15.5* 12.6* 9.3 12.4*  HGB 18.3* 17.7* 17.0 15.4 15.1  HCT 55.6* 53.3* 52.1* 46.1 44.6  MCV 93.6 93.2 94.0 91.7 92.3  PLT 187 147* 130* 153 179    Basic Metabolic Panel: Recent Labs  Lab 03/17/18 0456 03/17/18 1414 03/18/18 0337 03/19/18 0718 03/20/18 0348  NA 141 141 142 139 139  K 5.4* 4.3 4.9 3.6 3.7  CL 107 110 104 103 102  CO2 24 23 28 29 28   GLUCOSE 196* 191* 199* 156* 170*  BUN 26* 25* 25* 20 17  CREATININE 1.27* 1.01 1.15 0.75 0.71  CALCIUM 8.1* 7.6* 8.1* 7.6* 7.7*  MG  --   --   --   --  2.3  PHOS  --   --   --   --  1.2*    GFR: Estimated  Creatinine Clearance: 91.3 mL/min (by C-G formula based on SCr of 0.71 mg/dL).  Liver Function Tests: Recent Labs  Lab 03/16/18 0300 03/17/18 0456 03/18/18 0337 03/19/18 0718 03/20/18 0348  AST 294* 78* 57* 39 37  ALT 197* 112* 65* 44 37  ALKPHOS 79 66 45 39 45  BILITOT 2.6* 1.5* 1.6* 1.5* 1.3*  PROT 6.6 5.6* 5.1* 4.9* 4.9*  ALBUMIN 4.0 3.3* 2.8* 2.5* 2.4*   Recent Labs  Lab 03/16/18 0300 03/18/18 0337  LIPASE 8,026* 934*   No results for input(s): AMMONIA in the last 168 hours.  Coagulation Profile: No results for input(s): INR, PROTIME in the last 168 hours.  Cardiac Enzymes: Recent Labs  Lab 03/16/18 0300  TROPONINI <0.03    BNP (last 3 results) No results for input(s): PROBNP in the last 8760 hours.  HbA1C: No results for input(s): HGBA1C in the last 72 hours.  CBG: Recent Labs  Lab 03/19/18 0610 03/19/18 1113 03/19/18 1636 03/19/18 2121 03/20/18 0654  GLUCAP 136* 145* 129* 121* 174*    Lipid Profile: No results for input(s): CHOL, HDL, LDLCALC, TRIG, CHOLHDL, LDLDIRECT in the last 72 hours.  Thyroid Function Tests: No results for input(s): TSH, T4TOTAL, FREET4, T3FREE, THYROIDAB in the last 72 hours.  Anemia Panel: No results for input(s): VITAMINB12, FOLATE, FERRITIN, TIBC, IRON, RETICCTPCT in the last 72 hours.  Urine analysis: No results found for: COLORURINE, APPEARANCEUR, LABSPEC, PHURINE, GLUCOSEU, HGBUR, BILIRUBINUR, KETONESUR, PROTEINUR, UROBILINOGEN, NITRITE, LEUKOCYTESUR  Sepsis Labs: Lactic Acid, Venous No results found for: LATICACIDVEN  MICROBIOLOGY: No results found for this or any previous visit (from the past 240 hour(s)).  RADIOLOGY STUDIES/RESULTS: Ct Abdomen Pelvis W Contrast  Result Date: 03/16/2018 CLINICAL DATA:  67 y/o  M; epigastric pain. EXAM: CT ABDOMEN AND PELVIS WITH CONTRAST TECHNIQUE: Multidetector CT imaging of the abdomen and pelvis was performed using the standard protocol following bolus administration of  intravenous contrast. CONTRAST:  100 cc Omnipaque 300 COMPARISON:  01/25/2008 CT of the abdomen and pelvis. FINDINGS: Lower chest: No acute abnormality. Hepatobiliary: Mild intra and extrahepatic biliary ductal dilatation with the common bile duct measuring up to 11 mm. No focal liver lesion. Cholelithiasis. Pancreas: Severe extensive edema surrounding the pancreas compatible with acute pancreatitis. No discrete acute peripancreatic collection. Spleen: Normal in size without focal abnormality. Adrenals/Urinary Tract: Adrenal glands are unremarkable. Kidneys are normal, without renal calculi, focal lesion, or hydronephrosis. Bladder is unremarkable. Stomach/Bowel: Stomach is within normal limits. Mild wall thickening of the duodenum, likely reactive inflammatory changes. Appendix appears normal. No evidence of bowel wall thickening, distention, or inflammatory changes of the jejunum, ileum, or colon. Vascular/Lymphatic: Severe mixed atherosclerosis of the aorta. Bilateral common iliac and internal iliac artery aneurysms. The right common iliac artery aneurysm measures 2 cm and left 1.7 cm. No lymphadenopathy. Reproductive: Prostate is unremarkable. Other: No abdominal wall hernia or abnormality. No abdominopelvic ascites. Musculoskeletal: No fracture is seen. Moderate degenerative changes of the lumbar spine. IMPRESSION: 1. Acute pancreatitis with extensive retroperitoneal edema. No discrete acute peripancreatic collection identified at this time. Mild intra and extrahepatic biliary ductal dilatation with cholelithiasis may represent choledocholithiasis and gallstone pancreatitis. This can be further characterized with MRI/MRCP of the abdomen. 2. Severe aortic atherosclerosis with bilateral common iliac artery and internal iliac artery aneurysms. Electronically Signed   By: Mitzi HansenLance  Furusawa-Stratton M.D.   On: 03/16/2018 05:49   Mr 3d Recon At Scanner  Result Date: 03/17/2018 CLINICAL DATA:  Acute pancreatitis,  etiology unknown. Cholelithiasis, diabetes and hepatitis C. EXAM: MRI ABDOMEN WITHOUT AND WITH CONTRAST (INCLUDING MRCP) TECHNIQUE: Multiplanar multisequence MR imaging of the abdomen was performed both before and after the administration of intravenous contrast. Heavily T2-weighted images of the biliary and pancreatic ducts were obtained, and three-dimensional MRCP images were rendered by post processing. CONTRAST:  15mL MULTIHANCE GADOBENATE DIMEGLUMINE 529 MG/ML IV SOLN COMPARISON:  Abdominal CT 03/16/2018 and 01/25/2008. FINDINGS: Lower chest: New small bilateral pleural effusions with associated subsegmental atelectasis at both lung bases. Hepatobiliary: The liver is normal in appearance without morphologic changes of cirrhosis, focal lesion or abnormal enhancement. Large gallstones are again noted. The common hepatic duct measures 7 mm in diameter. There is no evidence of choledocholithiasis. Pancreas: There is moderate diffuse enlargement of the pancreas with extensive surrounding inflammatory changes. Prior to contrast, there is heterogeneous increased T1 signal throughout the pancreatic body and tail, suspicious for hemorrhage. Following contrast, there is very  poor enhancement of the pancreas, especially in the body and tail. Portions of the pancreatic head enhance normally. There is no pancreatic ductal dilatation. Normal pancreatic ductal anatomy. Spleen: Normal in size without focal abnormality. Adrenals/Urinary Tract: Both adrenal glands appear normal. Small bilateral renal cysts. No evidence of renal mass or hydronephrosis. Stomach/Bowel: No evidence of bowel wall thickening or focal inflammation. Stable mild colonic dilatation, most consistent with an ileus. Vascular/Lymphatic: There are no enlarged abdominal lymph nodes. No significant vascular findings. The portal, superior mesenteric and splenic veins are patent. Other: There is a moderate amount of ascites. There is extensive inflammation and  ill-defined fluid throughout the retroperitoneal and mesenteric fat. No well-defined fluid collections are demonstrated. Musculoskeletal: No acute or significant osseous findings. IMPRESSION: 1. Worsening severe acute pancreatitis with suspicion of hemorrhagic components. There is a very poor enhancement of most of the pancreas, consistent with necrotizing pancreatitis and implying a poor prognosis. 2. Associated increased ascites, inflammation and ill-defined fluid throughout the intra-abdominal fat, small bilateral pleural effusions and bibasilar atelectasis. 3. Cholelithiasis without evidence of significant biliary dilatation or choledocholithiasis. Electronically Signed   By: Carey Bullocks M.D.   On: 03/17/2018 10:41   Dg Chest Port 1 View  Result Date: 03/17/2018 CLINICAL DATA:  Short of breath and abdominal pain.  Pancreatitis. EXAM: PORTABLE CHEST 1 VIEW COMPARISON:  01/20/2018 FINDINGS: Midline trachea. Normal heart size for level of inspiration. Aortic atherosclerosis. Probable layering small left pleural effusion. No pneumothorax. Low lung volumes with resultant pulmonary interstitial prominence. Left greater than right base airspace disease is new. IMPRESSION: Low lung volumes with probable small left pleural effusion and bibasilar airspace disease. Airspace disease is favored to represent atelectasis. Especially at the left lung base, infection or aspiration cannot be excluded Aortic Atherosclerosis (ICD10-I70.0). Electronically Signed   By: Jeronimo Greaves M.D.   On: 03/17/2018 17:26   Dg Abd Portable 1v  Result Date: 03/19/2018 CLINICAL DATA:  Feeding tube placement EXAM: PORTABLE ABDOMEN - 1 VIEW COMPARISON:  03/18/2018 FINDINGS: Feeding tube has been placed with the tip in the distal descending duodenum. Diffuse gaseous distention of bowel, likely ileus, similar to prior study. IMPRESSION: Feeding tube tip in the distal descending duodenum. Electronically Signed   By: Charlett Nose M.D.   On:  03/19/2018 10:36   Dg Abd Portable 2v  Result Date: 03/18/2018 CLINICAL DATA:  History of hypertension. EXAM: PORTABLE ABDOMEN - 2 VIEW COMPARISON:  None. FINDINGS: Small pleural effusions. Air-filled loops of large and small bowel are identified with air to the level the rectum. There is no definitive dilatation. No free air, portal venous gas, or pneumatosis. No other acute abnormalities identified. IMPRESSION: Mildly prominent air-filled loops of large and small bowel without obstruction. A mild ileus is not excluded. Tiny pleural effusions. No other acute abnormalities. Electronically Signed   By: Gerome Sam III M.D   On: 03/18/2018 09:18   Mr Abdomen Mrcp W Wo Contast  Result Date: 03/17/2018 CLINICAL DATA:  Acute pancreatitis, etiology unknown. Cholelithiasis, diabetes and hepatitis C. EXAM: MRI ABDOMEN WITHOUT AND WITH CONTRAST (INCLUDING MRCP) TECHNIQUE: Multiplanar multisequence MR imaging of the abdomen was performed both before and after the administration of intravenous contrast. Heavily T2-weighted images of the biliary and pancreatic ducts were obtained, and three-dimensional MRCP images were rendered by post processing. CONTRAST:  15mL MULTIHANCE GADOBENATE DIMEGLUMINE 529 MG/ML IV SOLN COMPARISON:  Abdominal CT 03/16/2018 and 01/25/2008. FINDINGS: Lower chest: New small bilateral pleural effusions with associated subsegmental atelectasis at both lung  bases. Hepatobiliary: The liver is normal in appearance without morphologic changes of cirrhosis, focal lesion or abnormal enhancement. Large gallstones are again noted. The common hepatic duct measures 7 mm in diameter. There is no evidence of choledocholithiasis. Pancreas: There is moderate diffuse enlargement of the pancreas with extensive surrounding inflammatory changes. Prior to contrast, there is heterogeneous increased T1 signal throughout the pancreatic body and tail, suspicious for hemorrhage. Following contrast, there is very poor  enhancement of the pancreas, especially in the body and tail. Portions of the pancreatic head enhance normally. There is no pancreatic ductal dilatation. Normal pancreatic ductal anatomy. Spleen: Normal in size without focal abnormality. Adrenals/Urinary Tract: Both adrenal glands appear normal. Small bilateral renal cysts. No evidence of renal mass or hydronephrosis. Stomach/Bowel: No evidence of bowel wall thickening or focal inflammation. Stable mild colonic dilatation, most consistent with an ileus. Vascular/Lymphatic: There are no enlarged abdominal lymph nodes. No significant vascular findings. The portal, superior mesenteric and splenic veins are patent. Other: There is a moderate amount of ascites. There is extensive inflammation and ill-defined fluid throughout the retroperitoneal and mesenteric fat. No well-defined fluid collections are demonstrated. Musculoskeletal: No acute or significant osseous findings. IMPRESSION: 1. Worsening severe acute pancreatitis with suspicion of hemorrhagic components. There is a very poor enhancement of most of the pancreas, consistent with necrotizing pancreatitis and implying a poor prognosis. 2. Associated increased ascites, inflammation and ill-defined fluid throughout the intra-abdominal fat, small bilateral pleural effusions and bibasilar atelectasis. 3. Cholelithiasis without evidence of significant biliary dilatation or choledocholithiasis. Electronically Signed   By: Carey Bullocks M.D.   On: 03/17/2018 10:41     LOS: 4 days   Jeoffrey Massed, MD  [facility]  If 7PM-7AM, please contact night-coverage  Please page via [email or web address] [email or web address]-click on MD name and type text message  03/20/2018, 8:10 AM

## 2018-03-20 NOTE — Progress Notes (Addendum)
Patient ID: Ryan Chan, male   DOB: October 19, 1950, 67 y.o.   MRN: [id number]     Progress Note   Subjective  Day #5 hospital stay  On Meropenem Post pyloric feeds started 8/16   Feeling better generally today - flushed and feeling hot currently Tolerating sips, and feedings, no nausea/vomiting. PCA d/c'd this am - doing fine with prn's No SOB Had BM's yesterday     Objective   Vital signs in last 24 hours: Temp:  [97.7 F (36.5 C)-98.4 F (36.9 C)] 98.1 F (36.7 C) (08/17 1141) Pulse Rate:  [102-115] 115 (08/17 1141) Resp:  [15-25] 24 (08/17 1141) BP: (149-181)/(80-92) 161/82 (08/17 1141) SpO2:  [93 %-98 %] 96 % (08/17 1141) Weight:  [87.8 kg] 87.8 kg (08/17 0354) Last BM Date: 03/20/18 General:    white male in NAD, much more comfortable appearing Heart:  Regular rate and rhythm; no murmurs Lungs: Respirations even and unlabored, lungs CTA bilaterally Abdomen:  Soft, somewhat full ,distended tender across upper abdomen, BS quiet but present Neurologic:  Alert and oriented,  grossly normal neurologically. Psych:  Cooperative. Normal mood and affect.  Intake/Output from previous day: 08/16 0701 - 08/17 0700 In: 2593.3 [I.V.:2062.3; NG/GT:131; IV Piggyback:400] Out: 200 [Urine:200] Intake/Output this shift: Total I/O In: 1262.9 [P.O.:720; I.V.:12.9; NG/GT:280; IV Piggyback:250] Out: 1700 [Urine:1700]  Lab Results: Recent Labs    03/18/18 0337 03/19/18 0718 03/20/18 0348  WBC 12.6* 9.3 12.4*  HGB 17.0 15.4 15.1  HCT 52.1* 46.1 44.6  PLT 130* 153 179   BMET Recent Labs    03/18/18 0337 03/19/18 0718 03/20/18 0348  NA 142 139 139  K 4.9 3.6 3.7  CL 104 103 102  CO2 28 29 28   GLUCOSE 199* 156* 170*  BUN 25* 20 17  CREATININE 1.15 0.75 0.71  CALCIUM 8.1* 7.6* 7.7*   LFT Recent Labs    03/20/18 0348  PROT 4.9*  ALBUMIN 2.4*  AST 37  ALT 37  ALKPHOS 45  BILITOT 1.3*   PT/INR No results for input(s): LABPROT, INR in the last 72  hours.  Studies/Results: Dg Abd Portable 1v  Result Date: 03/19/2018 CLINICAL DATA:  Feeding tube placement EXAM: PORTABLE ABDOMEN - 1 VIEW COMPARISON:  03/18/2018 FINDINGS: Feeding tube has been placed with the tip in the distal descending duodenum. Diffuse gaseous distention of bowel, likely ileus, similar to prior study. IMPRESSION: Feeding tube tip in the distal descending duodenum. Electronically Signed   By: Charlett NoseKevin  Dover M.D.   On: 03/19/2018 10:36      Assessment / Plan:     #1 67 yo male with acute gallstone pancreatitis, severe with concern for necrosis on CT.  He is improving, labs continue to improve and are stable  Patient is significantly volume overloaded secondary to progressive volume support in setting of acute severe necrotizing pancreatitis  #2  acute kidney injury-resolved #3 hypertension #4 cholelithiasis  Recommendations:  Continue meropenem Continue sips of clears this weekend and postpyloric feeds Fluids have been discontinued and he is now receiving IV Lasix for volume overload We will plan to reimage in 2 or 3 days depending on his course.   Principal Problem:   Gallstone pancreatitis Active Problems:   Elevated LFTs   Hepatitis C   Essential hypertension   Diet-controlled diabetes mellitus (HCC)    LOS: 4 days   [name]  03/20/2018, 2:07 PM       Attending physician's note   I have taken an interval history,  reviewed the chart and examined the patient. I agree with the Advanced Practitioner's note, impression and recommendations. Severe pancreatitis, slowly improving. Volume overloaded, receiving Lasix. Continue Meropenem. Trend CMET, CBC. CRP=17.1. Eventually will need cholecystectomy.  Claudette HeadMalcolm Dagen Beevers, MD FACG [phone] office

## 2018-03-20 NOTE — Plan of Care (Signed)
Care plans reviewed and patient is progressing.  

## 2018-03-21 LAB — CBC
HEMATOCRIT: 42.4 % (ref 39.0–52.0)
Hemoglobin: 14.1 g/dL (ref 13.0–17.0)
MCH: 31.2 pg (ref 26.0–34.0)
MCHC: 33.3 g/dL (ref 30.0–36.0)
MCV: 93.8 fL (ref 78.0–100.0)
Platelets: 152 10*3/uL (ref 150–400)
RBC: 4.52 MIL/uL (ref 4.22–5.81)
RDW: 13.5 % (ref 11.5–15.5)
WBC: 11.2 10*3/uL — ABNORMAL HIGH (ref 4.0–10.5)

## 2018-03-21 LAB — LIPASE, BLOOD: LIPASE: 50 U/L (ref 11–51)

## 2018-03-21 LAB — COMPREHENSIVE METABOLIC PANEL
ALT: 33 U/L (ref 0–44)
AST: 32 U/L (ref 15–41)
Albumin: 2.2 g/dL — ABNORMAL LOW (ref 3.5–5.0)
Alkaline Phosphatase: 47 U/L (ref 38–126)
Anion gap: 10 (ref 5–15)
BILIRUBIN TOTAL: 1 mg/dL (ref 0.3–1.2)
BUN: 16 mg/dL (ref 8–23)
CO2: 30 mmol/L (ref 22–32)
Calcium: 7.4 mg/dL — ABNORMAL LOW (ref 8.9–10.3)
Chloride: 100 mmol/L (ref 98–111)
Creatinine, Ser: 0.73 mg/dL (ref 0.61–1.24)
GFR calc Af Amer: >60 mL/min (ref >60)
Glucose, Bld: 178 mg/dL — ABNORMAL HIGH (ref 70–99)
POTASSIUM: 3.9 mmol/L (ref 3.5–5.1)
Sodium: 140 mmol/L (ref 135–145)
TOTAL PROTEIN: 4.6 g/dL — AB (ref 6.5–8.1)

## 2018-03-21 LAB — GLUCOSE, CAPILLARY
GLUCOSE-CAPILLARY: 184 mg/dL — AB (ref 70–99)
GLUCOSE-CAPILLARY: 192 mg/dL — AB (ref 70–99)
Glucose-Capillary: 241 mg/dL — ABNORMAL HIGH (ref 70–99)
Glucose-Capillary: 176 mg/dL — ABNORMAL HIGH (ref 70–99)

## 2018-03-21 MED ORDER — FUROSEMIDE 10 MG/ML IJ SOLN
60.0000 mg | Freq: Two times a day (BID) | INTRAMUSCULAR | Status: DC
  Administered 2018-03-21 – 2018-03-22 (×4): 60 mg via INTRAVENOUS
  Filled 2018-03-21 (×4): qty 6

## 2018-03-21 MED ORDER — VITAL AF 1.2 CAL PO LIQD
1000.0000 mL | ORAL | Status: DC
Start: 2018-03-21 — End: 2018-03-23
  Administered 2018-03-22: 1000 mL
  Filled 2018-03-21 (×5): qty 1500

## 2018-03-21 MED ORDER — METOPROLOL TARTRATE 25 MG PO TABS
25.0000 mg | ORAL_TABLET | Freq: Two times a day (BID) | ORAL | Status: DC
  Administered 2018-03-21 – 2018-03-23 (×5): 25 mg via ORAL
  Filled 2018-03-21 (×5): qty 1

## 2018-03-21 NOTE — Progress Notes (Addendum)
PROGRESS NOTE        PATIENT DETAILS Name: Ryan Chan Age: 67 y.o. Sex: male Date of Birth: March 02, 1951 Admit Date: 03/16/2018 Admitting Physician Jonah Blue, MD ZOX:[id number], Pcp Not In  Brief Narrative: Patient is a 67 y.o. male with a history of hypertension, dyslipidemia presented with acute gallstone pancreatitis, MRI on 8/14 suggestive of necrotizing pancreatitis.  Improving with supportive care, hospital course complicated by volume overload with mild acute pulmonary edema and fever.  Started on empiric meropenem, postpyloric NG tube feedings, with plans to repeat imaging in the next few days.  General surgery and GI following.  See below for further details.    Subjective: Slowly improving-continues to have pain in the right mid abdomen area at times.  Worried that he may have a gallstone.  Feels less bloated/distended after initiating Lasix.  Assessment/Plan: Acute necrotizing gallstone pancreatitis: Slowly improving, pain has improved, he continues to have intermittent pain in the right mid abdominal area since yesterday afternoon-no renal stones seen on recent CT scan.  Seems to be tolerating some liquids and postpyloric NG tube feedings.  No fever for almost 48 hours-remains on empiric meropenem.  No longer on Dilaudid PCA, pain controlled with as needed dosing of narcotics.  Plans are to repeat a CT scan in the in the next 1-2 days.  General surgery and GI following as well.  Volume overload with mild pulmonary edema: Is currently +14 L, weight has decreased to 189 pounds compared to yesterday-has been started on intravenous Lasix.  On 8/14, did develop shortness of breath due to mild pulmonary edema that resolved quickly with IV Lasix.  Continue diuretics, continue to follow weights, electrolytes, intake/output.  Etiology of volume overload is aggressive IV fluid resuscitation in a setting of necrotizing pancreatitis. Reviewed transthoracic  echocardiogram done at [facility] June 2019 showed a preserved EF.  He also had a negative stress test in June 2019 as well.  Acute kidney injury: Hemodynamically mediated in the setting of necrotizing pancreatitis.  Renal function is much stable with supportive care.  Continue to follow electrolytes.  Mild hyperkalemia: Resolved.  Hypertension: BP on the higher side but better controlled this morning, on IV metoprolol-since oral intake has somewhat stabilized-we will switch patient to oral beta-blocker, and add as needed IV hydralazine for SBP more than 180.  Follow.  DVT Prophylaxis: Prophylactic Lovenox   Code Status: Full code   Family Communication: None at bedside-did speak with spouse at bedside yesterday.  Disposition Plan: Remain inpatient-will require several days of hospitalization  Antimicrobial agents: Anti-infectives (From admission, onward)   Start     Dose/Rate Route Frequency Ordered Stop   03/17/18 1600  meropenem (MERREM) 1 g in sodium chloride 0.9 % 100 mL IVPB     1 g 200 mL/hr over 30 Minutes Intravenous Every 8 hours 03/17/18 1507     03/17/18 1300  ampicillin-sulbactam (UNASYN) 1.5 g in sodium chloride 0.9 % 100 mL IVPB  Status:  Discontinued     1.5 g 200 mL/hr over 30 Minutes Intravenous Every 6 hours 03/17/18 1132 03/17/18 1440      Procedures: None  CONSULTS: GI CCS  Time spent: 25 minutes-Greater than 50% of this time was spent in counseling, explanation of diagnosis, planning of further management, and coordination of care.  MEDICATIONS: Scheduled Meds: . enoxaparin (LOVENOX) injection  40 mg  Subcutaneous Q24H  . furosemide  60 mg Intravenous BID  . insulin aspart  0-15 Units Subcutaneous TID WC  . insulin aspart  0-5 Units Subcutaneous QHS  . metoprolol tartrate  5 mg Intravenous Q8H  . nicotine  21 mg Transdermal Daily  . polyethylene glycol  17 g Oral Daily  . potassium chloride  20 mEq Oral Daily   Continuous Infusions: .  famotidine (PEPCID) IV Stopped (03/20/18 2200)  . feeding supplement (VITAL AF 1.2 CAL) 40 mL/hr at 03/20/18 0652  . lactated ringers 20 mL/hr at 03/21/18 0342  . meropenem (MERREM) IV Stopped (03/20/18 2337)   PRN Meds:.acetaminophen **OR** acetaminophen, alum & mag hydroxide-simeth, bisacodyl, chlorproMAZINE (THORAZINE) injection, hydrALAZINE, HYDROmorphone (DILAUDID) injection, ondansetron **OR** ondansetron (ZOFRAN) IV, oxyCODONE, senna-docusate   PHYSICAL EXAM: Vital signs: Vitals:   03/20/18 1730 03/20/18 2055 03/20/18 2312 03/21/18 0502  BP: (!) 155/79 (!) 156/80 (!) 165/84 (!) 163/89  Pulse: (!) 103 (!) 104  (!) 105  Resp: (!) 23 15 (!) 21 20  Temp: 99.4 F (37.4 C) 98.2 F (36.8 C) 98.5 F (36.9 C) 97.6 F (36.4 C)  TempSrc: Oral Oral Oral Oral  SpO2: 92% 91% 93% 91%  Weight:    86 kg  Height:       Filed Weights   03/19/18 0343 03/20/18 0354 03/21/18 0502  Weight: 85.6 kg 87.8 kg 86 kg   Body mass index is 31.57 kg/m.  General appearance:Awake, alert, not in any distress.  Eyes:no scleral icterus. HEENT: Atraumatic and Normocephalic Neck: supple, no JVD. Resp:Good air entry bilaterally, scattered bibasilar rales CVS: S1 S2 regular, no murmurs.  GI: Bowel sounds present, mildly tender in the epigastric and midabdominal (right-sided) area without any peritoneal signs.   Extremities: B/L Lower Ext shows trace edema, both legs are warm to touch Neurology:  Non focal Psychiatric: Normal judgment and insight. Normal mood. Musculoskeletal:No digital cyanosis Skin:No Rash, warm and dry Wounds:N/A  I have personally reviewed following labs and imaging studies  LABORATORY DATA: CBC: Recent Labs  Lab 03/17/18 1414 03/18/18 0337 03/19/18 0718 03/20/18 0348 03/21/18 0241  WBC 15.5* 12.6* 9.3 12.4* 11.2*  HGB 17.7* 17.0 15.4 15.1 14.1  HCT 53.3* 52.1* 46.1 44.6 42.4  MCV 93.2 94.0 91.7 92.3 93.8  PLT 147* 130* 153 179 152    Basic Metabolic Panel: Recent  Labs  Lab 03/17/18 1414 03/18/18 0337 03/19/18 0718 03/20/18 0348 03/21/18 0241  NA 141 142 139 139 140  K 4.3 4.9 3.6 3.7 3.9  CL 110 104 103 102 100  CO2 23 28 29 28 30   GLUCOSE 191* 199* 156* 170* 178*  BUN 25* 25* 20 17 16   CREATININE 1.01 1.15 0.75 0.71 0.73  CALCIUM 7.6* 8.1* 7.6* 7.7* 7.4*  MG  --   --   --  2.3  --   PHOS  --   --   --  1.2*  --     GFR: Estimated Creatinine Clearance: 90.4 mL/min (by C-G formula based on SCr of 0.73 mg/dL).  Liver Function Tests: Recent Labs  Lab 03/17/18 0456 03/18/18 0337 03/19/18 0718 03/20/18 0348 03/21/18 0241  AST 78* 57* 39 37 32  ALT 112* 65* 44 37 33  ALKPHOS 66 45 39 45 47  BILITOT 1.5* 1.6* 1.5* 1.3* 1.0  PROT 5.6* 5.1* 4.9* 4.9* 4.6*  ALBUMIN 3.3* 2.8* 2.5* 2.4* 2.2*   Recent Labs  Lab 03/16/18 0300 03/18/18 0337 03/21/18 0241  LIPASE 8,026* 934* 50   No  results for input(s): AMMONIA in the last 168 hours.  Coagulation Profile: No results for input(s): INR, PROTIME in the last 168 hours.  Cardiac Enzymes: Recent Labs  Lab 03/16/18 0300  TROPONINI <0.03    BNP (last 3 results) No results for input(s): PROBNP in the last 8760 hours.  HbA1C: No results for input(s): HGBA1C in the last 72 hours.  CBG: Recent Labs  Lab 03/20/18 0654 03/20/18 1118 03/20/18 1656 03/20/18 2109 03/21/18 0611  GLUCAP 174* 182* 126* 165* 241*    Lipid Profile: No results for input(s): CHOL, HDL, LDLCALC, TRIG, CHOLHDL, LDLDIRECT in the last 72 hours.  Thyroid Function Tests: No results for input(s): TSH, T4TOTAL, FREET4, T3FREE, THYROIDAB in the last 72 hours.  Anemia Panel: No results for input(s): VITAMINB12, FOLATE, FERRITIN, TIBC, IRON, RETICCTPCT in the last 72 hours.  Urine analysis: No results found for: COLORURINE, APPEARANCEUR, LABSPEC, PHURINE, GLUCOSEU, HGBUR, BILIRUBINUR, KETONESUR, PROTEINUR, UROBILINOGEN, NITRITE, LEUKOCYTESUR  Sepsis Labs: Lactic Acid, Venous No results found for:  LATICACIDVEN  MICROBIOLOGY: Recent Results (from the past 240 hour(s))  Culture, blood (routine x 2)     Status: None (Preliminary result)   Collection Time: 03/19/18  2:43 PM  Result Value Ref Range Status   Specimen Description BLOOD LEFT ARM  Final   Special Requests   Final    BOTTLES DRAWN AEROBIC AND ANAEROBIC Blood Culture adequate volume   Culture   Final    NO GROWTH < 24 HOURS Performed at [facility], [address] [place], [place] [address]    Report Status PENDING  Incomplete  Culture, blood (routine x 2)     Status: None (Preliminary result)   Collection Time: 03/19/18  2:51 PM  Result Value Ref Range Status   Specimen Description BLOOD RIGHT HAND  Final   Special Requests   Final    BOTTLES DRAWN AEROBIC AND ANAEROBIC Blood Culture adequate volume   Culture   Final    NO GROWTH < 24 HOURS Performed at [facility], [address] [place], [place] [address]    Report Status PENDING  Incomplete    RADIOLOGY STUDIES/RESULTS: Ct Abdomen Pelvis W Contrast  Result Date: 03/16/2018 CLINICAL DATA:  67 y/o  M; epigastric pain. EXAM: CT ABDOMEN AND PELVIS WITH CONTRAST TECHNIQUE: Multidetector CT imaging of the abdomen and pelvis was performed using the standard protocol following bolus administration of intravenous contrast. CONTRAST:  100 cc Omnipaque 300 COMPARISON:  01/25/2008 CT of the abdomen and pelvis. FINDINGS: Lower chest: No acute abnormality. Hepatobiliary: Mild intra and extrahepatic biliary ductal dilatation with the common bile duct measuring up to 11 mm. No focal liver lesion. Cholelithiasis. Pancreas: Severe extensive edema surrounding the pancreas compatible with acute pancreatitis. No discrete acute peripancreatic collection. Spleen: Normal in size without focal abnormality. Adrenals/Urinary Tract: Adrenal glands are unremarkable. Kidneys are normal, without renal calculi, focal lesion, or hydronephrosis. Bladder is unremarkable. Stomach/Bowel:  Stomach is within normal limits. Mild wall thickening of the duodenum, likely reactive inflammatory changes. Appendix appears normal. No evidence of bowel wall thickening, distention, or inflammatory changes of the jejunum, ileum, or colon. Vascular/Lymphatic: Severe mixed atherosclerosis of the aorta. Bilateral common iliac and internal iliac artery aneurysms. The right common iliac artery aneurysm measures 2 cm and left 1.7 cm. No lymphadenopathy. Reproductive: Prostate is unremarkable. Other: No abdominal wall hernia or abnormality. No abdominopelvic ascites. Musculoskeletal: No fracture is seen. Moderate degenerative changes of the lumbar spine. IMPRESSION: 1. Acute pancreatitis with extensive  retroperitoneal edema. No discrete acute peripancreatic collection identified at this time. Mild intra and extrahepatic biliary ductal dilatation with cholelithiasis may represent choledocholithiasis and gallstone pancreatitis. This can be further characterized with MRI/MRCP of the abdomen. 2. Severe aortic atherosclerosis with bilateral common iliac artery and internal iliac artery aneurysms. Electronically Signed   By: Mitzi Hansen M.D.   On: 03/16/2018 05:49   Mr 3d Recon At Scanner  Result Date: 03/17/2018 CLINICAL DATA:  Acute pancreatitis, etiology unknown. Cholelithiasis, diabetes and hepatitis C. EXAM: MRI ABDOMEN WITHOUT AND WITH CONTRAST (INCLUDING MRCP) TECHNIQUE: Multiplanar multisequence MR imaging of the abdomen was performed both before and after the administration of intravenous contrast. Heavily T2-weighted images of the biliary and pancreatic ducts were obtained, and three-dimensional MRCP images were rendered by post processing. CONTRAST:  15mL MULTIHANCE GADOBENATE DIMEGLUMINE 529 MG/ML IV SOLN COMPARISON:  Abdominal CT 03/16/2018 and 01/25/2008. FINDINGS: Lower chest: New small bilateral pleural effusions with associated subsegmental atelectasis at both lung bases. Hepatobiliary: The  liver is normal in appearance without morphologic changes of cirrhosis, focal lesion or abnormal enhancement. Large gallstones are again noted. The common hepatic duct measures 7 mm in diameter. There is no evidence of choledocholithiasis. Pancreas: There is moderate diffuse enlargement of the pancreas with extensive surrounding inflammatory changes. Prior to contrast, there is heterogeneous increased T1 signal throughout the pancreatic body and tail, suspicious for hemorrhage. Following contrast, there is very poor enhancement of the pancreas, especially in the body and tail. Portions of the pancreatic head enhance normally. There is no pancreatic ductal dilatation. Normal pancreatic ductal anatomy. Spleen: Normal in size without focal abnormality. Adrenals/Urinary Tract: Both adrenal glands appear normal. Small bilateral renal cysts. No evidence of renal mass or hydronephrosis. Stomach/Bowel: No evidence of bowel wall thickening or focal inflammation. Stable mild colonic dilatation, most consistent with an ileus. Vascular/Lymphatic: There are no enlarged abdominal lymph nodes. No significant vascular findings. The portal, superior mesenteric and splenic veins are patent. Other: There is a moderate amount of ascites. There is extensive inflammation and ill-defined fluid throughout the retroperitoneal and mesenteric fat. No well-defined fluid collections are demonstrated. Musculoskeletal: No acute or significant osseous findings. IMPRESSION: 1. Worsening severe acute pancreatitis with suspicion of hemorrhagic components. There is a very poor enhancement of most of the pancreas, consistent with necrotizing pancreatitis and implying a poor prognosis. 2. Associated increased ascites, inflammation and ill-defined fluid throughout the intra-abdominal fat, small bilateral pleural effusions and bibasilar atelectasis. 3. Cholelithiasis without evidence of significant biliary dilatation or choledocholithiasis. Electronically  Signed   By: Carey Bullocks M.D.   On: 03/17/2018 10:41   Dg Chest Port 1 View  Result Date: 03/17/2018 CLINICAL DATA:  Short of breath and abdominal pain.  Pancreatitis. EXAM: PORTABLE CHEST 1 VIEW COMPARISON:  01/20/2018 FINDINGS: Midline trachea. Normal heart size for level of inspiration. Aortic atherosclerosis. Probable layering small left pleural effusion. No pneumothorax. Low lung volumes with resultant pulmonary interstitial prominence. Left greater than right base airspace disease is new. IMPRESSION: Low lung volumes with probable small left pleural effusion and bibasilar airspace disease. Airspace disease is favored to represent atelectasis. Especially at the left lung base, infection or aspiration cannot be excluded Aortic Atherosclerosis (ICD10-I70.0). Electronically Signed   By: Jeronimo Greaves M.D.   On: 03/17/2018 17:26   Dg Abd Portable 1v  Result Date: 03/19/2018 CLINICAL DATA:  Feeding tube placement EXAM: PORTABLE ABDOMEN - 1 VIEW COMPARISON:  03/18/2018 FINDINGS: Feeding tube has been placed with the tip in the distal descending  duodenum. Diffuse gaseous distention of bowel, likely ileus, similar to prior study. IMPRESSION: Feeding tube tip in the distal descending duodenum. Electronically Signed   By: Charlett Nose M.D.   On: 03/19/2018 10:36   Dg Abd Portable 2v  Result Date: 03/18/2018 CLINICAL DATA:  History of hypertension. EXAM: PORTABLE ABDOMEN - 2 VIEW COMPARISON:  None. FINDINGS: Small pleural effusions. Air-filled loops of large and small bowel are identified with air to the level the rectum. There is no definitive dilatation. No free air, portal venous gas, or pneumatosis. No other acute abnormalities identified. IMPRESSION: Mildly prominent air-filled loops of large and small bowel without obstruction. A mild ileus is not excluded. Tiny pleural effusions. No other acute abnormalities. Electronically Signed   By: Gerome Sam III M.D   On: 03/18/2018 09:18   Mr Abdomen  Mrcp W Wo Contast  Result Date: 03/17/2018 CLINICAL DATA:  Acute pancreatitis, etiology unknown. Cholelithiasis, diabetes and hepatitis C. EXAM: MRI ABDOMEN WITHOUT AND WITH CONTRAST (INCLUDING MRCP) TECHNIQUE: Multiplanar multisequence MR imaging of the abdomen was performed both before and after the administration of intravenous contrast. Heavily T2-weighted images of the biliary and pancreatic ducts were obtained, and three-dimensional MRCP images were rendered by post processing. CONTRAST:  15mL MULTIHANCE GADOBENATE DIMEGLUMINE 529 MG/ML IV SOLN COMPARISON:  Abdominal CT 03/16/2018 and 01/25/2008. FINDINGS: Lower chest: New small bilateral pleural effusions with associated subsegmental atelectasis at both lung bases. Hepatobiliary: The liver is normal in appearance without morphologic changes of cirrhosis, focal lesion or abnormal enhancement. Large gallstones are again noted. The common hepatic duct measures 7 mm in diameter. There is no evidence of choledocholithiasis. Pancreas: There is moderate diffuse enlargement of the pancreas with extensive surrounding inflammatory changes. Prior to contrast, there is heterogeneous increased T1 signal throughout the pancreatic body and tail, suspicious for hemorrhage. Following contrast, there is very poor enhancement of the pancreas, especially in the body and tail. Portions of the pancreatic head enhance normally. There is no pancreatic ductal dilatation. Normal pancreatic ductal anatomy. Spleen: Normal in size without focal abnormality. Adrenals/Urinary Tract: Both adrenal glands appear normal. Small bilateral renal cysts. No evidence of renal mass or hydronephrosis. Stomach/Bowel: No evidence of bowel wall thickening or focal inflammation. Stable mild colonic dilatation, most consistent with an ileus. Vascular/Lymphatic: There are no enlarged abdominal lymph nodes. No significant vascular findings. The portal, superior mesenteric and splenic veins are patent.  Other: There is a moderate amount of ascites. There is extensive inflammation and ill-defined fluid throughout the retroperitoneal and mesenteric fat. No well-defined fluid collections are demonstrated. Musculoskeletal: No acute or significant osseous findings. IMPRESSION: 1. Worsening severe acute pancreatitis with suspicion of hemorrhagic components. There is a very poor enhancement of most of the pancreas, consistent with necrotizing pancreatitis and implying a poor prognosis. 2. Associated increased ascites, inflammation and ill-defined fluid throughout the intra-abdominal fat, small bilateral pleural effusions and bibasilar atelectasis. 3. Cholelithiasis without evidence of significant biliary dilatation or choledocholithiasis. Electronically Signed   By: Carey Bullocks M.D.   On: 03/17/2018 10:41     LOS: 5 days   Jeoffrey Massed, MD  [facility]  If 7PM-7AM, please contact night-coverage  Please page via [email or web address]-click on MD name and type text message  03/21/2018, 8:31 AM

## 2018-03-21 NOTE — Progress Notes (Addendum)
Patient ID: Ryan PeanCharles Dean Chan, male   DOB: 07-27-1951, 67 y.o.   MRN: [id number]    Progress Note   Subjective  Day # 6 hospital   On meropenem  Post pyloric feeds  Has been afebrile, off O2  Off PCA  Pt sleeping soundly - I did not disturb    Objective   Vital signs in last 24 hours: Temp:  [97.6 F (36.4 C)-99.4 F (37.4 C)] 98.9 F (37.2 C) (08/18 0854) Pulse Rate:  [96-110] 96 (08/18 0854) Resp:  [15-23] 20 (08/18 0854) BP: (143-165)/(70-89) 143/70 (08/18 0854) SpO2:  [91 %-95 %] 92 % (08/18 0854) Weight:  [86 kg] 86 kg (08/18 0502) Last BM Date: 03/20/18 General:    white male  in NAD Heart:  Regular rate and rhythm; no murmurs Lungs: Respirations even and unlabored, lungs CTA bilaterally Abdomen:  Soft, tender upper abdomen and nondistended. Normal bowel sounds. Extremities:  Without edema. Neurologic:  sleeping Psych:  Cooperative. Normal mood and affect.  Intake/Output from previous day: 08/17 0701 - 08/18 0700 In: 3769.5 [P.O.:720; I.V.:1151.5; NG/GT:1228; IV Piggyback:670] Out: 3875 [Urine:3875] Intake/Output this shift: Total I/O In: 486 [P.O.:120; NG/GT:216; IV Piggyback:150] Out: 1800 [Urine:1800]  Lab Results: Recent Labs    03/19/18 0718 03/20/18 0348 03/21/18 0241  WBC 9.3 12.4* 11.2*  HGB 15.4 15.1 14.1  HCT 46.1 44.6 42.4  PLT 153 179 152   BMET Recent Labs    03/19/18 0718 03/20/18 0348 03/21/18 0241  NA 139 139 140  K 3.6 3.7 3.9  CL 103 102 100  CO2 29 28 30   GLUCOSE 156* 170* 178*  BUN 20 17 16   CREATININE 0.75 0.71 0.73  CALCIUM 7.6* 7.7* 7.4*   LFT Recent Labs    03/21/18 0241  PROT 4.6*  ALBUMIN 2.2*  AST 32  ALT 33  ALKPHOS 47  BILITOT 1.0      Assessment / Plan:     #1 67 yo WM with acute severe pancreatitis, felt to have passed CBD stone  Concern for necrosis on initial CT 8/13  Pt has had continued improvement - off PCA - not requiring much pain med Tolerating full liquids, and also on post  pyloric feeds Labs all stable  #2 AKI - secondary to acute pancreatitis - resolved   #3 Volume overload - secondary to aggressive volume support - doing well, slowly diuresing on lasix - still 15 L +  #4 cholelithiasis - surgery following for eventual cholecystectomy   Recommendations:  Continue current regimen  Will order follow up CT abd/pelvis for tomorrow   Principal Problem:   Gallstone pancreatitis Active Problems:   Elevated LFTs   Hepatitis C   Essential hypertension   Diet-controlled diabetes mellitus (HCC)     LOS: 5 days   [name]  03/21/2018, 12:11 PM      Attending physician's note   I have taken an interval history, reviewed the chart and examined the patient. I agree with the Advanced Practitioner's note, impression and recommendations. Acute, severe pancreatitis gradually improving. Continue antibiotics and post pyloric tube feedings. CT abd/pelvis tomorrow.   Claudette HeadMalcolm Adella Manolis, MD FACG [phone] office

## 2018-03-21 NOTE — Plan of Care (Signed)
Care plans reviewed and patient is progressing.  

## 2018-03-22 ENCOUNTER — Encounter (HOSPITAL_COMMUNITY): Payer: Self-pay | Admitting: Radiology

## 2018-03-22 ENCOUNTER — Inpatient Hospital Stay (HOSPITAL_COMMUNITY): Payer: PPO

## 2018-03-22 LAB — COMPREHENSIVE METABOLIC PANEL
ALBUMIN: 2.4 g/dL — AB (ref 3.5–5.0)
ALT: 30 U/L (ref 0–44)
ANION GAP: 8 (ref 5–15)
AST: 31 U/L (ref 15–41)
Alkaline Phosphatase: 55 U/L (ref 38–126)
BILIRUBIN TOTAL: 0.9 mg/dL (ref 0.3–1.2)
BUN: 16 mg/dL (ref 8–23)
CHLORIDE: 101 mmol/L (ref 98–111)
CO2: 31 mmol/L (ref 22–32)
Calcium: 7.5 mg/dL — ABNORMAL LOW (ref 8.9–10.3)
Creatinine, Ser: 0.57 mg/dL — ABNORMAL LOW (ref 0.61–1.24)
GFR calc Af Amer: >60 mL/min (ref >60)
GFR calc non Af Amer: >60 mL/min (ref >60)
GLUCOSE: 191 mg/dL — AB (ref 70–99)
POTASSIUM: 3.6 mmol/L (ref 3.5–5.1)
Sodium: 140 mmol/L (ref 135–145)
TOTAL PROTEIN: 4.9 g/dL — AB (ref 6.5–8.1)

## 2018-03-22 LAB — C DIFFICILE QUICK SCREEN W PCR REFLEX
C DIFFICILE (CDIFF) TOXIN: NEGATIVE
C DIFFICLE (CDIFF) ANTIGEN: NEGATIVE
C Diff interpretation: NOT DETECTED

## 2018-03-22 LAB — MAGNESIUM: Magnesium: 2.2 mg/dL (ref 1.7–2.4)

## 2018-03-22 LAB — GLUCOSE, CAPILLARY
GLUCOSE-CAPILLARY: 187 mg/dL — AB (ref 70–99)
GLUCOSE-CAPILLARY: 200 mg/dL — AB (ref 70–99)
GLUCOSE-CAPILLARY: 233 mg/dL — AB (ref 70–99)
Glucose-Capillary: 220 mg/dL — ABNORMAL HIGH (ref 70–99)

## 2018-03-22 LAB — CBC
HEMATOCRIT: 43.5 % (ref 39.0–52.0)
HEMOGLOBIN: 14.2 g/dL (ref 13.0–17.0)
MCH: 30.9 pg (ref 26.0–34.0)
MCHC: 32.6 g/dL (ref 30.0–36.0)
MCV: 94.6 fL (ref 78.0–100.0)
Platelets: 180 10*3/uL (ref 150–400)
RBC: 4.6 MIL/uL (ref 4.22–5.81)
RDW: 13.6 % (ref 11.5–15.5)
WBC: 11.3 10*3/uL — AB (ref 4.0–10.5)

## 2018-03-22 LAB — PHOSPHORUS: PHOSPHORUS: 2.1 mg/dL — AB (ref 2.5–4.6)

## 2018-03-22 MED ORDER — IOPAMIDOL (ISOVUE-300) INJECTION 61%
INTRAVENOUS | Status: AC
  Filled 2018-03-22: qty 150

## 2018-03-22 MED ORDER — IOPAMIDOL (ISOVUE-300) INJECTION 61%
INTRAVENOUS | Status: AC
  Filled 2018-03-22: qty 30

## 2018-03-22 MED ORDER — IOPAMIDOL (ISOVUE-300) INJECTION 61%
INTRAVENOUS | Status: AC
  Filled 2018-03-22: qty 100

## 2018-03-22 MED ORDER — IOPAMIDOL (ISOVUE-300) INJECTION 61%
100.0000 mL | Freq: Once | INTRAVENOUS | Status: AC | PRN
  Administered 2018-03-22: 100 mL via INTRAVENOUS

## 2018-03-22 MED ORDER — POTASSIUM PHOSPHATES 15 MMOLE/5ML IV SOLN
20.0000 mmol | Freq: Once | INTRAVENOUS | Status: AC
  Administered 2018-03-22: 20 mmol via INTRAVENOUS
  Filled 2018-03-22: qty 6.67

## 2018-03-22 NOTE — Progress Notes (Signed)
Inpatient Diabetes Program Recommendations  AACE/ADA: New Consensus Statement on Inpatient Glycemic Control (2015)  Target Ranges:  Prepandial:   less than 140 mg/dL      Peak postprandial:   less than 180 mg/dL (1-2 hours)      Critically ill patients:  140 - 180 mg/dL   Lab Results  Component Value Date   GLUCAP 200 (H) 03/22/2018    Review of Glycemic Control  Results for Ryan Chan, Sabastion DEAN (MRN [id number]) as of 03/22/2018 15:06  Ref. Range 03/21/2018 11:00 03/21/2018 16:05 03/21/2018 21:40 03/22/2018 06:28 03/22/2018 10:56  Glucose-Capillary Latest Ref Range: 70 - 99 mg/dL 119184 (H) 147176 (H) 829192 (H) 187 (H) 200 (H)   Diabetes history: Diet controlled DM Outpatient Diabetes medications:  None Current orders for Inpatient glycemic control:  Novolog moderate tid with meals and HS  Inpatient Diabetes Program Recommendations:    While in the hospital, consider adding Lantus 8 units daily.  Also consider checking A1C.   Thanks,  Beryl MeagerJenny Yanel Dombrosky, RN, BC-ADM Inpatient Diabetes Coordinator Pager [phone] (8a-5p)

## 2018-03-22 NOTE — Progress Notes (Signed)
PROGRESS NOTE        PATIENT DETAILS Name: Ryan Chan Age: 67 y.o. Sex: male Date of Birth: [birth year] Admit Date: 03/16/2018 Admitting Physician Jonah Blue, MD ZOX:[id number], Pcp Not In  Brief Narrative: Patient is a 67 y.o. male with a history of hypertension, dyslipidemia presented with acute gallstone pancreatitis, MRI on 8/14 suggestive of necrotizing pancreatitis.  Improving with supportive care, hospital course complicated by volume overload with mild acute pulmonary edema and fever.  Started on empiric meropenem, postpyloric NG tube feedings, with plans to repeat imaging in the next few days.  General surgery and GI following.  See below for further details.    Subjective: Patient in bed, overall continues to feel better, denies any chest pain or shortness of breath, epigastric abdominal pain much improved and minimal now, no nausea.  Passing flatus having bowel movements.  Assessment/Plan:  Acute necrotizing gallstone pancreatitis: Medically he is definitely improving despite worsening CT and radiological findings, currently tolerating postpyloric NG tube feeds, continues to be on empiric meropenem due to low-grade fevers in the setting of necrotizing pancreatitis, cultures negative, now afebrile and feels better, continue pain control and supportive care.  Has developed significant edema and anasarca hence IV fluids stopped, since blood pressures are stable continue diuresis with Lasix as tolerated.  GI and general surgery on board.  Volume overload with mild pulmonary edema: Plan as above, continue gentle diuresis, reviewed transthoracic echocardiogram done at [facility] June 2019 showed a preserved EF.  He also had a negative stress test in June 2019 as well.  Acute kidney injury: Hemodynamically mediated in the setting of necrotizing pancreatitis resolved after IV fluids.  Hypophosphatemia.  Replaced will monitor.  Hypertension: BP  on the higher side but better controlled this morning, on IV metoprolol-since oral intake has somewhat stabilized-we will switch patient to oral beta-blocker, and add as needed IV hydralazine for SBP more than 180.  Follow.   DVT Prophylaxis: Prophylactic Lovenox   Code Status: Full code   Family Communication: None at bedside-did speak with spouse at bedside yesterday.  Disposition Plan: Remain inpatient-will require several days of hospitalization  Antimicrobial agents: Anti-infectives (From admission, onward)   Start     Dose/Rate Route Frequency Ordered Stop   03/17/18 1600  meropenem (MERREM) 1 g in sodium chloride 0.9 % 100 mL IVPB     1 g 200 mL/hr over 30 Minutes Intravenous Every 8 hours 03/17/18 1507     03/17/18 1300  ampicillin-sulbactam (UNASYN) 1.5 g in sodium chloride 0.9 % 100 mL IVPB  Status:  Discontinued     1.5 g 200 mL/hr over 30 Minutes Intravenous Every 6 hours 03/17/18 1132 03/17/18 1440      Procedures: None  CONSULTS: GI CCS  Time spent: 25 minutes-Greater than 50% of this time was spent in counseling, explanation of diagnosis, planning of further management, and coordination of care.  MEDICATIONS: Scheduled Meds: . enoxaparin (LOVENOX) injection  40 mg Subcutaneous Q24H  . furosemide  60 mg Intravenous BID  . insulin aspart  0-15 Units Subcutaneous TID WC  . insulin aspart  0-5 Units Subcutaneous QHS  . iopamidol      . iopamidol      . metoprolol tartrate  25 mg Oral BID  . nicotine  21 mg Transdermal Daily  . polyethylene glycol  17 g Oral Daily  .  potassium chloride  20 mEq Oral Daily   Continuous Infusions: . famotidine (PEPCID) IV 20 mg (03/22/18 1108)  . feeding supplement (VITAL AF 1.2 CAL) 80 mL/hr at 03/21/18 2202  . lactated ringers 20 mL/hr at 03/22/18 0416  . meropenem (MERREM) IV 1 g (03/22/18 1017)  . potassium PHOSPHATE IVPB (in mmol)     PRN Meds:.acetaminophen **OR** acetaminophen, alum & mag hydroxide-simeth,  bisacodyl, chlorproMAZINE (THORAZINE) injection, hydrALAZINE, HYDROmorphone (DILAUDID) injection, ondansetron **OR** ondansetron (ZOFRAN) IV, oxyCODONE, senna-docusate   PHYSICAL EXAM: Vital signs: Vitals:   03/21/18 1212 03/21/18 1602 03/21/18 1959 03/22/18 0415  BP: (!) 157/82 (!) 147/67 129/65 (!) 153/69  Pulse: (!) 105 (!) 105 (!) 103 91  Resp:  20 19 17   Temp:  98.6 F (37 C) 99.4 F (37.4 C) 98.9 F (37.2 C)  TempSrc:  Oral Oral Oral  SpO2: 94% 92% 92% 92%  Weight:    82.6 kg  Height:       Filed Weights   03/20/18 0354 03/21/18 0502 03/22/18 0415  Weight: 87.8 kg 86 kg 82.6 kg   Body mass index is 30.3 kg/m.    Exam  Awake Alert, Oriented X 3, No new F.N deficits, Normal affect [place].AT,PERRAL Supple Neck,No JVD, No cervical lymphadenopathy appriciated.  Symmetrical Chest wall movement, Good air movement bilaterally, CTAB RRR,No Gallops, Rubs or new Murmurs, No Parasternal Heave +ve B.Sounds, Abd Soft, minimal epigastric tenderness, No organomegaly appriciated, No rebound - guarding or rigidity. No Cyanosis, Clubbing, trace below waist edema, No new Rash or bruise  I have personally reviewed following labs and imaging studies  LABORATORY DATA: CBC: Recent Labs  Lab 03/17/18 1414 03/18/18 0337 03/19/18 0718 03/20/18 0348 03/21/18 0241  WBC 15.5* 12.6* 9.3 12.4* 11.2*  HGB 17.7* 17.0 15.4 15.1 14.1  HCT 53.3* 52.1* 46.1 44.6 42.4  MCV 93.2 94.0 91.7 92.3 93.8  PLT 147* 130* 153 179 152    Basic Metabolic Panel: Recent Labs  Lab 03/17/18 1414 03/18/18 0337 03/19/18 0718 03/20/18 0348 03/21/18 0241 03/22/18 0357  NA 141 142 139 139 140  --   K 4.3 4.9 3.6 3.7 3.9  --   CL 110 104 103 102 100  --   CO2 23 28 29 28 30   --   GLUCOSE 191* 199* 156* 170* 178*  --   BUN 25* 25* 20 17 16   --   CREATININE 1.01 1.15 0.75 0.71 0.73  --   CALCIUM 7.6* 8.1* 7.6* 7.7* 7.4*  --   MG  --   --   --  2.3  --   --   PHOS  --   --   --  1.2*  --  2.1*     GFR: Estimated Creatinine Clearance: 88.6 mL/min (by C-G formula based on SCr of 0.73 mg/dL).  Liver Function Tests: Recent Labs  Lab 03/17/18 0456 03/18/18 0337 03/19/18 0718 03/20/18 0348 03/21/18 0241  AST 78* 57* 39 37 32  ALT 112* 65* 44 37 33  ALKPHOS 66 45 39 45 47  BILITOT 1.5* 1.6* 1.5* 1.3* 1.0  PROT 5.6* 5.1* 4.9* 4.9* 4.6*  ALBUMIN 3.3* 2.8* 2.5* 2.4* 2.2*   Recent Labs  Lab 03/16/18 0300 03/18/18 0337 03/21/18 0241  LIPASE 8,026* 934* 50   No results for input(s): AMMONIA in the last 168 hours.  Coagulation Profile: No results for input(s): INR, PROTIME in the last 168 hours.  Cardiac Enzymes: Recent Labs  Lab 03/16/18 0300  TROPONINI <0.03  BNP (last 3 results) No results for input(s): PROBNP in the last 8760 hours.  HbA1C: No results for input(s): HGBA1C in the last 72 hours.  CBG: Recent Labs  Lab 03/21/18 1100 03/21/18 1605 03/21/18 2140 03/22/18 0628 03/22/18 1056  GLUCAP 184* 176* 192* 187* 200*    Lipid Profile: No results for input(s): CHOL, HDL, LDLCALC, TRIG, CHOLHDL, LDLDIRECT in the last 72 hours.  Thyroid Function Tests: No results for input(s): TSH, T4TOTAL, FREET4, T3FREE, THYROIDAB in the last 72 hours.  Anemia Panel: No results for input(s): VITAMINB12, FOLATE, FERRITIN, TIBC, IRON, RETICCTPCT in the last 72 hours.  Urine analysis: No results found for: COLORURINE, APPEARANCEUR, LABSPEC, PHURINE, GLUCOSEU, HGBUR, BILIRUBINUR, KETONESUR, PROTEINUR, UROBILINOGEN, NITRITE, LEUKOCYTESUR  Sepsis Labs: Lactic Acid, Venous No results found for: LATICACIDVEN  MICROBIOLOGY: Recent Results (from the past 240 hour(s))  Culture, blood (routine x 2)     Status: None (Preliminary result)   Collection Time: 03/19/18  2:43 PM  Result Value Ref Range Status   Specimen Description BLOOD LEFT ARM  Final   Special Requests   Final    BOTTLES DRAWN AEROBIC AND ANAEROBIC Blood Culture adequate volume   Culture   Final     NO GROWTH 2 DAYS Performed at [facility], [address] [place], [place] [address]    Report Status PENDING  Incomplete  Culture, blood (routine x 2)     Status: None (Preliminary result)   Collection Time: 03/19/18  2:51 PM  Result Value Ref Range Status   Specimen Description BLOOD RIGHT HAND  Final   Special Requests   Final    BOTTLES DRAWN AEROBIC AND ANAEROBIC Blood Culture adequate volume   Culture   Final    NO GROWTH 2 DAYS Performed at [facility], [address] [place], [place] [address]    Report Status PENDING  Incomplete    RADIOLOGY STUDIES/RESULTS: Ct Abdomen Pelvis W Contrast  Result Date: 03/22/2018 CLINICAL DATA:  Inpatient.  Necrotizing gallstone pancreatitis. EXAM: CT ABDOMEN AND PELVIS WITH CONTRAST TECHNIQUE: Multidetector CT imaging of the abdomen and pelvis was performed using the standard protocol following bolus administration of intravenous contrast. CONTRAST:  ISOVUE-300 IOPAMIDOL (ISOVUE-300) INJECTION 61% COMPARISON:  04/02/2018 CT abdomen/pelvis.  03/17/2018 MRI abdomen. FINDINGS: Lower chest: Small dependent bilateral pleural effusions, mildly increased bilaterally. Dependent atelectasis at both lung bases. Hepatobiliary: Normal liver size. No liver mass. Cholelithiasis. No definite gallbladder wall thickening. No biliary ductal dilatation. CBD diameter 5 mm. Pancreas: There is absence of parenchymal enhancement throughout much of the pancreatic body and portions of the pancreatic tail, compatible with necrotizing pancreatitis, significantly worsened since 03/16/2018 CT and similar to slightly worsened since 03/17/2018 MRI. Enhancement is preserved in the pancreatic head. Diffuse peripancreatic fluid and fat stranding is similar. No discrete pancreatic mass. No pancreatic duct dilation. Spleen: Normal size. No mass. Adrenals/Urinary Tract: Normal adrenals. No hydronephrosis. Subcentimeter hypodense renal cortical lesions in the mid to  lower right kidney are too small to characterize and unchanged. Normal bladder. Stomach/Bowel: Enteric tube terminates in the distal stomach. Stomach appears unremarkable. Diffuse moderate small bowel wall thickening without significant small bowel dilatation, worsened from prior CT. Normal appendix. Normal large bowel with no diverticulosis, large bowel wall thickening or pericolonic fat stranding. Vascular/Lymphatic: Atherosclerotic nonaneurysmal abdominal aorta. Patent portal, splenic, hepatic and renal veins. Enlarged 1.9 cm porta hepatis node (series 3/image 34), increased from 1.4 cm. No additional pathologically enlarged lymph nodes in the abdomen or pelvis.  Reproductive: Top-normal size prostate with nonspecific internal prostatic calcifications. Other: No pneumoperitoneum. Small to moderate volume ascites, predominantly perihepatic, increased. No focal measurable fluid collection. Mild anasarca, new. Musculoskeletal: No aggressive appearing focal osseous lesions. Moderate thoracolumbar spondylosis. IMPRESSION: 1. Necrotizing pancreatitis as detailed, similar to slightly worsened since 03/17/2018 MRI, significantly worsened since 03/16/2018 CT. 2. Worsening third-spacing of fluid, with increased small to moderate volume ascites, increased small dependent bilateral pleural effusions, new anasarca and increased diffuse small bowel wall thickening. 3. Mild porta hepatis adenopathy is increased and probably reactive. 4. Cholelithiasis.  No biliary ductal dilatation. Electronically Signed   By: Delbert PhenixJason A Poff M.D.   On: 03/22/2018 09:06   Ct Abdomen Pelvis W Contrast  Result Date: 03/16/2018 CLINICAL DATA:  67 y/o  M; epigastric pain. EXAM: CT ABDOMEN AND PELVIS WITH CONTRAST TECHNIQUE: Multidetector CT imaging of the abdomen and pelvis was performed using the standard protocol following bolus administration of intravenous contrast. CONTRAST:  100 cc Omnipaque 300 COMPARISON:  01/25/2008 CT of the abdomen and  pelvis. FINDINGS: Lower chest: No acute abnormality. Hepatobiliary: Mild intra and extrahepatic biliary ductal dilatation with the common bile duct measuring up to 11 mm. No focal liver lesion. Cholelithiasis. Pancreas: Severe extensive edema surrounding the pancreas compatible with acute pancreatitis. No discrete acute peripancreatic collection. Spleen: Normal in size without focal abnormality. Adrenals/Urinary Tract: Adrenal glands are unremarkable. Kidneys are normal, without renal calculi, focal lesion, or hydronephrosis. Bladder is unremarkable. Stomach/Bowel: Stomach is within normal limits. Mild wall thickening of the duodenum, likely reactive inflammatory changes. Appendix appears normal. No evidence of bowel wall thickening, distention, or inflammatory changes of the jejunum, ileum, or colon. Vascular/Lymphatic: Severe mixed atherosclerosis of the aorta. Bilateral common iliac and internal iliac artery aneurysms. The right common iliac artery aneurysm measures 2 cm and left 1.7 cm. No lymphadenopathy. Reproductive: Prostate is unremarkable. Other: No abdominal wall hernia or abnormality. No abdominopelvic ascites. Musculoskeletal: No fracture is seen. Moderate degenerative changes of the lumbar spine. IMPRESSION: 1. Acute pancreatitis with extensive retroperitoneal edema. No discrete acute peripancreatic collection identified at this time. Mild intra and extrahepatic biliary ductal dilatation with cholelithiasis may represent choledocholithiasis and gallstone pancreatitis. This can be further characterized with MRI/MRCP of the abdomen. 2. Severe aortic atherosclerosis with bilateral common iliac artery and internal iliac artery aneurysms. Electronically Signed   By: Mitzi HansenLance  Furusawa-Stratton M.D.   On: 03/16/2018 05:49   Mr 3d Recon At Scanner  Result Date: 03/17/2018 CLINICAL DATA:  Acute pancreatitis, etiology unknown. Cholelithiasis, diabetes and hepatitis C. EXAM: MRI ABDOMEN WITHOUT AND WITH  CONTRAST (INCLUDING MRCP) TECHNIQUE: Multiplanar multisequence MR imaging of the abdomen was performed both before and after the administration of intravenous contrast. Heavily T2-weighted images of the biliary and pancreatic ducts were obtained, and three-dimensional MRCP images were rendered by post processing. CONTRAST:  15mL MULTIHANCE GADOBENATE DIMEGLUMINE 529 MG/ML IV SOLN COMPARISON:  Abdominal CT 03/16/2018 and 01/25/2008. FINDINGS: Lower chest: New small bilateral pleural effusions with associated subsegmental atelectasis at both lung bases. Hepatobiliary: The liver is normal in appearance without morphologic changes of cirrhosis, focal lesion or abnormal enhancement. Large gallstones are again noted. The common hepatic duct measures 7 mm in diameter. There is no evidence of choledocholithiasis. Pancreas: There is moderate diffuse enlargement of the pancreas with extensive surrounding inflammatory changes. Prior to contrast, there is heterogeneous increased T1 signal throughout the pancreatic body and tail, suspicious for hemorrhage. Following contrast, there is very poor enhancement of the pancreas, especially in the body and tail.  Portions of the pancreatic head enhance normally. There is no pancreatic ductal dilatation. Normal pancreatic ductal anatomy. Spleen: Normal in size without focal abnormality. Adrenals/Urinary Tract: Both adrenal glands appear normal. Small bilateral renal cysts. No evidence of renal mass or hydronephrosis. Stomach/Bowel: No evidence of bowel wall thickening or focal inflammation. Stable mild colonic dilatation, most consistent with an ileus. Vascular/Lymphatic: There are no enlarged abdominal lymph nodes. No significant vascular findings. The portal, superior mesenteric and splenic veins are patent. Other: There is a moderate amount of ascites. There is extensive inflammation and ill-defined fluid throughout the retroperitoneal and mesenteric fat. No well-defined fluid  collections are demonstrated. Musculoskeletal: No acute or significant osseous findings. IMPRESSION: 1. Worsening severe acute pancreatitis with suspicion of hemorrhagic components. There is a very poor enhancement of most of the pancreas, consistent with necrotizing pancreatitis and implying a poor prognosis. 2. Associated increased ascites, inflammation and ill-defined fluid throughout the intra-abdominal fat, small bilateral pleural effusions and bibasilar atelectasis. 3. Cholelithiasis without evidence of significant biliary dilatation or choledocholithiasis. Electronically Signed   By: Carey BullocksWilliam  Veazey M.D.   On: 03/17/2018 10:41   Dg Chest Port 1 View  Result Date: 03/17/2018 CLINICAL DATA:  Short of breath and abdominal pain.  Pancreatitis. EXAM: PORTABLE CHEST 1 VIEW COMPARISON:  01/20/2018 FINDINGS: Midline trachea. Normal heart size for level of inspiration. Aortic atherosclerosis. Probable layering small left pleural effusion. No pneumothorax. Low lung volumes with resultant pulmonary interstitial prominence. Left greater than right base airspace disease is new. IMPRESSION: Low lung volumes with probable small left pleural effusion and bibasilar airspace disease. Airspace disease is favored to represent atelectasis. Especially at the left lung base, infection or aspiration cannot be excluded Aortic Atherosclerosis (ICD10-I70.0). Electronically Signed   By: Jeronimo GreavesKyle  Talbot M.D.   On: 03/17/2018 17:26   Dg Abd Portable 1v  Result Date: 03/19/2018 CLINICAL DATA:  Feeding tube placement EXAM: PORTABLE ABDOMEN - 1 VIEW COMPARISON:  03/18/2018 FINDINGS: Feeding tube has been placed with the tip in the distal descending duodenum. Diffuse gaseous distention of bowel, likely ileus, similar to prior study. IMPRESSION: Feeding tube tip in the distal descending duodenum. Electronically Signed   By: Charlett NoseKevin  Dover M.D.   On: 03/19/2018 10:36   Dg Abd Portable 2v  Result Date: 03/18/2018 CLINICAL DATA:  History  of hypertension. EXAM: PORTABLE ABDOMEN - 2 VIEW COMPARISON:  None. FINDINGS: Small pleural effusions. Air-filled loops of large and small bowel are identified with air to the level the rectum. There is no definitive dilatation. No free air, portal venous gas, or pneumatosis. No other acute abnormalities identified. IMPRESSION: Mildly prominent air-filled loops of large and small bowel without obstruction. A mild ileus is not excluded. Tiny pleural effusions. No other acute abnormalities. Electronically Signed   By: Gerome Samavid  Williams III M.D   On: 03/18/2018 09:18   Mr Abdomen Mrcp W Wo Contast  Result Date: 03/17/2018 CLINICAL DATA:  Acute pancreatitis, etiology unknown. Cholelithiasis, diabetes and hepatitis C. EXAM: MRI ABDOMEN WITHOUT AND WITH CONTRAST (INCLUDING MRCP) TECHNIQUE: Multiplanar multisequence MR imaging of the abdomen was performed both before and after the administration of intravenous contrast. Heavily T2-weighted images of the biliary and pancreatic ducts were obtained, and three-dimensional MRCP images were rendered by post processing. CONTRAST:  15mL MULTIHANCE GADOBENATE DIMEGLUMINE 529 MG/ML IV SOLN COMPARISON:  Abdominal CT 03/16/2018 and 01/25/2008. FINDINGS: Lower chest: New small bilateral pleural effusions with associated subsegmental atelectasis at both lung bases. Hepatobiliary: The liver is normal in appearance without morphologic changes  of cirrhosis, focal lesion or abnormal enhancement. Large gallstones are again noted. The common hepatic duct measures 7 mm in diameter. There is no evidence of choledocholithiasis. Pancreas: There is moderate diffuse enlargement of the pancreas with extensive surrounding inflammatory changes. Prior to contrast, there is heterogeneous increased T1 signal throughout the pancreatic body and tail, suspicious for hemorrhage. Following contrast, there is very poor enhancement of the pancreas, especially in the body and tail. Portions of the pancreatic  head enhance normally. There is no pancreatic ductal dilatation. Normal pancreatic ductal anatomy. Spleen: Normal in size without focal abnormality. Adrenals/Urinary Tract: Both adrenal glands appear normal. Small bilateral renal cysts. No evidence of renal mass or hydronephrosis. Stomach/Bowel: No evidence of bowel wall thickening or focal inflammation. Stable mild colonic dilatation, most consistent with an ileus. Vascular/Lymphatic: There are no enlarged abdominal lymph nodes. No significant vascular findings. The portal, superior mesenteric and splenic veins are patent. Other: There is a moderate amount of ascites. There is extensive inflammation and ill-defined fluid throughout the retroperitoneal and mesenteric fat. No well-defined fluid collections are demonstrated. Musculoskeletal: No acute or significant osseous findings. IMPRESSION: 1. Worsening severe acute pancreatitis with suspicion of hemorrhagic components. There is a very poor enhancement of most of the pancreas, consistent with necrotizing pancreatitis and implying a poor prognosis. 2. Associated increased ascites, inflammation and ill-defined fluid throughout the intra-abdominal fat, small bilateral pleural effusions and bibasilar atelectasis. 3. Cholelithiasis without evidence of significant biliary dilatation or choledocholithiasis. Electronically Signed   By: Carey Bullocks M.D.   On: 03/17/2018 10:41     LOS: 6 days   Susa Raring, MD  [facility]  If 7PM-7AM, please contact night-coverage  Please page via [email or web address] [email or web address]-click on MD name and type text message  03/22/2018, 11:30 AM

## 2018-03-22 NOTE — Progress Notes (Signed)
Nutrition Follow Up  DOCUMENTATION CODES:   Obesity unspecified  INTERVENTION:    Continue Vital AF 1.2 at goal rate of 80 ml/hr  Provides 2304 kcals, 144 gm protein, 1557 ml free water daily  NUTRITION DIAGNOSIS:   Inadequate oral intake related to altered GI function(necrotizing pancreatitis) as evidenced by NPO status, ongoing  GOAL:   Patient will meet greater than or equal to 90% of their needs, met  MONITOR:   TF tolerance, Labs, Skin, Weight trends, I & O's  ASSESSMENT:   67 yo Male with PMH of dyslipidemia presented with acute gallstone pancreatitis, MRI on 8/14 suggestive of necrotizing pancreatitis.    8/16 Cortrak placed -- tip at descending part of duodenum  RD spoke with pt at bedside. His pain is better. Advanced to a Full Liquid diet. Tolerating a little bit. Vital AF 1.2 infusing at goal rate of 80 ml/hr. Tolerating well.  Denies nausea. Passing gas and having bowel movements. Labs and medications reviewed. Phos 2.1 (L). CBG's 192-187-200.  His weight has fluctuated due to volume overload then diuresis.   Diet Order:   Diet Order            Diet full liquid Room service appropriate? Yes; Fluid consistency: Thin  Diet effective now             EDUCATION NEEDS:   Not appropriate for education at this time  Skin:  Skin Assessment: Reviewed RN Assessment  Last BM:  8/17   Intake/Output Summary (Last 24 hours) at 03/22/2018 1330 Last data filed at 03/22/2018 1239 Gross per 24 hour  Intake 2854.48 ml  Output 850 ml  Net 2004.48 ml   Height:   Ht Readings from Last 1 Encounters:  03/16/18 5' 5" (1.651 m)   Weight:   Wt Readings from Last 1 Encounters:  03/22/18 82.6 kg   Ideal Body Weight:  61.8 kg  BMI:  Body mass index is 30.3 kg/m.  Estimated Nutritional Needs:   Kcal:  2300-2500  Protein:  130-145 gm  Fluid:  2.3-2.5 L  Katie , RD, LDN Pager #: [phone] After-Hours Pager #: [phone]  

## 2018-03-22 NOTE — Progress Notes (Signed)
Gastroenterology Inpatient Follow-up Note   PATIENT IDENTIFICATION  Ryan Chan is a 67 y.o. male with a pmh significant for DM, HTN, reported HCV, and recent gallstone pancreatitis. Hospital Day: 7  SUBJECTIVE  The patient is tolerating his NG tube feedings. Patient is off PCA and only requiring interval to pain medications. Patient is having multiple bowel movements which she is associating to the initiation of tube feeds. He feels less distended than previous. Underwent a CT scan today which showed absence of parenchymal enhancement throughout much of the pancreatic body and pancreatic tail consistent with necrotizing pancreatitis which does seem worse than last week's CT scan and MRI.  There was preserved enhancement of the pancreas within the head.  There was still diffuse peripancreatic fluid and fat stranding present without a discrete mass.  There was concern for some porta hepatis adenopathy increased from previous.  There is no biliary ductal dilation. The patient denies fevers or chills.   OBJECTIVE  Scheduled Inpatient Medications:  . enoxaparin (LOVENOX) injection  40 mg Subcutaneous Q24H  . furosemide  60 mg Intravenous BID  . insulin aspart  0-15 Units Subcutaneous TID WC  . insulin aspart  0-5 Units Subcutaneous QHS  . iopamidol      . metoprolol tartrate  25 mg Oral BID  . nicotine  21 mg Transdermal Daily  . polyethylene glycol  17 g Oral Daily  . potassium chloride  20 mEq Oral Daily   Continuous Inpatient Infusions:  . famotidine (PEPCID) IV 20 mg (03/22/18 1108)  . feeding supplement (VITAL AF 1.2 CAL) 1,000 mL (03/22/18 1625)  . lactated ringers 20 mL/hr at 03/22/18 0416  . meropenem (MERREM) IV 1 g (03/22/18 1017)  . potassium PHOSPHATE IVPB (in mmol) 20 mmol (03/22/18 1340)   PRN Inpatient Medications: acetaminophen **OR** acetaminophen, alum & mag hydroxide-simeth, bisacodyl, chlorproMAZINE (THORAZINE) injection, hydrALAZINE, HYDROmorphone  (DILAUDID) injection, ondansetron **OR** ondansetron (ZOFRAN) IV, oxyCODONE, senna-docusate   Physical Examination  Temp:  [98.9 F (37.2 C)-99.4 F (37.4 C)] 99.3 F (37.4 C) (08/19 1600) Pulse Rate:  [91-103] 91 (08/19 0415) Resp:  [16-19] 16 (08/19 1600) BP: (129-153)/(65-75) 147/75 (08/19 1600) SpO2:  [92 %-93 %] 93 % (08/19 1600) Weight:  [82.6 kg] 82.6 kg (08/19 0415) Temp (24hrs), Avg:99.2 F (37.3 C), Min:98.9 F (37.2 C), Max:99.4 F (37.4 C)  Weight: 82.6 kg(Scale A) GEN: NAD, appears much better than last week (though still looks ill) PSYCH: Cooperative, without pressured speech EYE: Conjunctivae pink, sclerae anicteric ENT: MMM, with NG tube in place CV: RR without R/Gs  RESP: Decreased breath sounds at the bases bilaterally GI: NABS, soft, tenderness to palpation in the midepigastrium and right upper quadrant, mild volitional guarding upon deep palpation, no rebound  MSK/EXT: BLE edema present SKIN: No jaundice NEURO:  Alert & Oriented x 3, no focal deficits   Review of Data   Laboratory Studies   Recent Labs  Lab 03/20/18 0348  03/22/18 0357  NA 139   < > 140  K 3.7   < > 3.6  CL 102   < > 101  CO2 28   < > 31  BUN 17   < > 16  CREATININE 0.71   < > 0.57*  GLUCOSE 170*   < > 191*  CALCIUM 7.7*   < > 7.5*  MG 2.3  --  2.2  PHOS 1.2*  --  2.1*   < > = values in this interval not displayed.   Recent  Labs  Lab 03/22/18 0357  AST 31  ALT 30  ALKPHOS 55    Recent Labs  Lab 03/20/18 0348 03/21/18 0241 03/22/18 0357  WBC 12.4* 11.2* 11.3*  HGB 15.1 14.1 14.2  HCT 44.6 42.4 43.5  PLT 179 152 180   No results for input(s): APTT, INR in the last 168 hours.  GI Studies  No new studies  Imaging Studies  Ct Abdomen Pelvis W Contrast  Result Date: 03/22/2018 CLINICAL DATA:  Inpatient.  Necrotizing gallstone pancreatitis. EXAM: CT ABDOMEN AND PELVIS WITH CONTRAST TECHNIQUE: Multidetector CT imaging of the abdomen and pelvis was performed using  the standard protocol following bolus administration of intravenous contrast. CONTRAST:  100mL ISOVUE-300 IOPAMIDOL (ISOVUE-300) INJECTION 61% COMPARISON:  04/02/2018 CT abdomen/pelvis.  03/17/2018 MRI abdomen. FINDINGS: Lower chest: Small dependent bilateral pleural effusions, mildly increased bilaterally. Dependent atelectasis at both lung bases. Hepatobiliary: Normal liver size. No liver mass. Cholelithiasis. No definite gallbladder wall thickening. No biliary ductal dilatation. CBD diameter 5 mm. Pancreas: There is absence of parenchymal enhancement throughout much of the pancreatic body and portions of the pancreatic tail, compatible with necrotizing pancreatitis, significantly worsened since 03/16/2018 CT and similar to slightly worsened since 03/17/2018 MRI. Enhancement is preserved in the pancreatic head. Diffuse peripancreatic fluid and fat stranding is similar. No discrete pancreatic mass. No pancreatic duct dilation. Spleen: Normal size. No mass. Adrenals/Urinary Tract: Normal adrenals. No hydronephrosis. Subcentimeter hypodense renal cortical lesions in the mid to lower right kidney are too small to characterize and unchanged. Normal bladder. Stomach/Bowel: Enteric tube terminates in the distal stomach. Stomach appears unremarkable. Diffuse moderate small bowel wall thickening without significant small bowel dilatation, worsened from prior CT. Normal appendix. Normal large bowel with no diverticulosis, large bowel wall thickening or pericolonic fat stranding. Vascular/Lymphatic: Atherosclerotic nonaneurysmal abdominal aorta. Patent portal, splenic, hepatic and renal veins. Enlarged 1.9 cm porta hepatis node (series 3/image 34), increased from 1.4 cm. No additional pathologically enlarged lymph nodes in the abdomen or pelvis. Reproductive: Top-normal size prostate with nonspecific internal prostatic calcifications. Other: No pneumoperitoneum. Small to moderate volume ascites, predominantly perihepatic,  increased. No focal measurable fluid collection. Mild anasarca, new. Musculoskeletal: No aggressive appearing focal osseous lesions. Moderate thoracolumbar spondylosis. IMPRESSION: 1. Necrotizing pancreatitis as detailed, similar to slightly worsened since 03/17/2018 MRI, significantly worsened since 03/16/2018 CT. 2. Worsening third-spacing of fluid, with increased small to moderate volume ascites, increased small dependent bilateral pleural effusions, new anasarca and increased diffuse small bowel wall thickening. 3. Mild porta hepatis adenopathy is increased and probably reactive. 4. Cholelithiasis.  No biliary ductal dilatation. Electronically Signed   By: Delbert PhenixJason A Poff M.D.   On: 03/22/2018 09:06     ASSESSMENT  Ryan Chan is a 67 y.o. male with a pmh significant for DM, HTN, reported HCV, and recent gallstone pancreatitis.  This patient seems to be clinically stabilizing.  We will have to continue to monitor him closely.  At some point in the future I suspect he will need some sort of endoscopic or percutaneous drainage.  I am hopeful that we can hold off on that for now.  I began to start mobilizing this patient further to see if we can get him to a rehabilitation facility versus home with home health services.  I would begin to ask that our care management team to see how he can get a carbapenem for at least 3 weeks at home or at rehab.  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  He will need an interval cholecystectomy at some point in time.  I will advance his diet tomorrow morning to try and see if he can begin to initiate some solid foods into his diet as well as continue supplemental NG tube feedings.  I would like a C. difficile to be sent because of his increased bowel movements which I suspect is more result of his ileus improving as well as the tube feeds that  he is receiving.  All questions were answered to the best of my ability.   PLAN/RECOMMENDATIONS  Send C. Difficile PCR Testing Begin to work towards formalizing plan for discharge with ABx for at least a 3-week course Continue nutrition with NGT and liberalize slowly into tomorrow AM a low-fat diet if able to tolerate more without issues (slowly) Hold on drainage unless persistent recurrent fevers while on appropriate antibiotics   Discharge Planning Diet: Will try to advance tomorrow Anticoagulation and antiplatelets: Per medicine Discharge Medications: Unknown Follow up: Will eventually need outpatient GI followup   Please page/call with questions or concerns.   Corliss ParishGabriel Mansouraty, MD  Gastroenterology [facility] Office # [phone]    LOS: 6 days  Lemar LoftyGabriel Mansouraty Jr  03/22/2018, 6:26 PM

## 2018-03-22 NOTE — Evaluation (Signed)
Physical Therapy Evaluation Patient Details Name: Ryan PeanCharles Dean Erpelding MRN: [id number] DOB: 05-09-1951 Today's Date: 03/22/2018   History of Present Illness  Patient is a 67 y.o. male with a history of hypertension, dyslipidemia presented with acute gallstone pancreatitis, MRI on 8/14 suggestive of necrotizing pancreatitis.  Improving with supportive care, hospital course complicated by volume overload with mild acute pulmonary edema and fever.  Started on empiric meropenem, postpyloric NG tube feedings, with plans to repeat imaging in the next few days.  Clinical Impression  Pt admitted with above diagnosis. Pt currently with functional limitations due to the deficits listed below (see PT Problem List). Pt was able to ambulate in hallways with 1 Ue support at times with min guard assist.  Should progress well.   Pt will benefit from skilled PT to increase their independence and safety with mobility to allow discharge to the venue listed below.    Follow Up Recommendations No PT follow up;Supervision - Intermittent    Equipment Recommendations  Other (comment)(TBA)    Recommendations for Other Services       Precautions / Restrictions Precautions Precautions: None Restrictions Weight Bearing Restrictions: No      Mobility  Bed Mobility Overal bed mobility: Independent                Transfers Overall transfer level: Independent                  Ambulation/Gait Ambulation/Gait assistance: Min guard Gait Distance (Feet): 350 Feet Assistive device: None Gait Pattern/deviations: Step-through pattern;Decreased stride length   Gait velocity interpretation: 1.31 - 2.62 ft/sec, indicative of limited community ambulator General Gait Details: Pt ambulated well overall with good cadence.  No LOB.  Slow at times and at times reaching for IV pole .  Stairs            Wheelchair Mobility    Modified Rankin (Stroke Patients Only)       Balance Overall  balance assessment: Needs assistance Sitting-balance support: No upper extremity supported;Feet supported Sitting balance-Leahy Scale: Normal     Standing balance support: No upper extremity supported;During functional activity Standing balance-Leahy Scale: Fair Standing balance comment: can stand statically without UE or external support.  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) Type of Home: [facility] Home Access: Stairs to enter Entrance Stairs-Rails: None Secretary/administratorntrance Stairs-Number of Steps: 2 Home Layout: One level Home Equipment: Environmental consultantWalker - 2 wheels;Bedside commode      Prior Function Level of Independence: Independent               Hand Dominance        Extremity/Trunk Assessment   Upper Extremity Assessment Upper Extremity Assessment: Defer to OT evaluation    Lower Extremity Assessment Lower Extremity Assessment: Overall WFL for tasks assessed    Cervical / Trunk Assessment Cervical / Trunk Assessment: Normal  Communication   Communication: No difficulties  Cognition Arousal/Alertness: Awake/alert Behavior During Therapy: WFL for tasks assessed/performed Overall Cognitive Status: Within Functional Limits for tasks assessed  General Comments      Exercises General Exercises - Lower Extremity Ankle Circles/Pumps: AROM;Both;10 reps;Seated Long Arc Quad: AROM;Both;10 reps;Seated Hip Flexion/Marching: AROM;Both;10 reps;Seated   Assessment/Plan    PT Assessment Patient needs continued PT services  PT Problem List Decreased activity tolerance;Decreased  balance;Decreased mobility;Decreased knowledge of use of DME;Decreased safety awareness;Decreased knowledge of precautions       PT Treatment Interventions DME instruction;Gait training;Functional mobility training;Therapeutic activities;Therapeutic exercise;Balance training;Patient/family education;Stair training    PT Goals (Current goals can be found in the Care Plan section)  Acute Rehab PT Goals Patient Stated Goal: to go home PT Goal Formulation: With patient Time For Goal Achievement: 04/05/18 Potential to Achieve Goals: Good    Frequency Min 3X/week   Barriers to discharge        Co-evaluation               AM-PAC PT "6 Clicks" Daily Activity  Outcome Measure Difficulty turning over in bed (including adjusting bedclothes, sheets and blankets)?: None Difficulty moving from lying on back to sitting on the side of the bed? : None Difficulty sitting down on and standing up from a chair with arms (e.g., wheelchair, bedside commode, etc,.)?: None Help needed moving to and from a bed to chair (including a wheelchair)?: A Little Help needed walking in hospital room?: A Little Help needed climbing 3-5 steps with a railing? : A Little 6 Click Score: 21    End of Session Equipment Utilized During Treatment: Gait belt Activity Tolerance: Patient tolerated treatment well Patient left: in bed;with call bell/phone within reach Nurse Communication: Mobility status PT Visit Diagnosis: Unsteadiness on feet (R26.81);Muscle weakness (generalized) (M62.81)    Time: [id number] PT Time Calculation (min) (ACUTE ONLY): 24 min   Charges:   PT Evaluation $PT Eval Moderate Complexity: 1 Mod PT Treatments $Gait Training: 8-22 mins        [facility] CenterDawn Houston Surges,PT Acute Rehabilitation [phone] (pager)   Berline Lopesawn F Akasha Melena 03/22/2018, 4:27 PM

## 2018-03-22 NOTE — Progress Notes (Signed)
[facility] Progress Note  5 Days Post-Op   Assessment/Plan HTN HLD Tobacco abuse - smokes 2 PPD DM - per patient well controlled with diet, not on any medications Hepatitis C - never received treatment Severe aortic atherosclerosis Gallstone pancreatitis - no prior h/o abdominal surgery - CT scan 8/13 acute pancreatitis, mild intraand extrahepatic biliary ductal dilatation with cholelithiasis - MRCP 8/14 no choledocholithiasis, worsening pancreatitis with possible hemorrhagic component or necrotizing pancreatitis  ID -unasyn 8/14>> VTE -SCDs, lovenox FEN -IVF, NPO, TF via cortrak Foley -none  DISPO: resolving slowly, repeat CT pending, with such a severe episode he may not be able to have lap chole this admission. We will await read of CT scan before making a definitive decision    LOS: 6 days    Subjective: CC: abdominal pain  Pain continues to improve. Tolerating TF's and having BM's. No nausea or vomiting. No issues overnight.   Objective: Vital signs in last 24 hours: Temp:  [98.6 F (37 C)-99.4 F (37.4 C)] 98.9 F (37.2 C) (08/19 0415) Pulse Rate:  [91-105] 91 (08/19 0415) Resp:  [17-20] 17 (08/19 0415) BP: (129-157)/(65-82) 153/69 (08/19 0415) SpO2:  [92 %-94 %] 92 % (08/19 0415) Weight:  [82.6 kg] 82.6 kg (08/19 0415) Last BM Date: 03/20/18  Intake/Output from previous day: 08/18 0701 - 08/19 0700 In: 3601.3 [P.O.:480; I.V.:417.8; NG/GT:1599.7; IV Piggyback:1103.8] Out: 1800 [Urine:1800] Intake/Output this shift: No intake/output data recorded.  PE: Gen:  Alert, NAD, pleasant, cooperative Pulm:  Rate and effort normal Abd: Soft, distended, + BS, mild lower abdominal TTP without guarding, no peritonitis Skin: no rashes noted, warm and dry  Anti-infectives: Anti-infectives (From admission, onward)   Start     Dose/Rate Route Frequency Ordered Stop   03/17/18 1600  meropenem (MERREM) 1 g in sodium chloride 0.9 % 100 mL IVPB      1 g 200 mL/hr over 30 Minutes Intravenous Every 8 hours 03/17/18 1507     03/17/18 1300  ampicillin-sulbactam (UNASYN) 1.5 g in sodium chloride 0.9 % 100 mL IVPB  Status:  Discontinued     1.5 g 200 mL/hr over 30 Minutes Intravenous Every 6 hours 03/17/18 1132 03/17/18 1440      Lab Results:  Recent Labs    03/20/18 0348 03/21/18 0241  WBC 12.4* 11.2*  HGB 15.1 14.1  HCT 44.6 42.4  PLT 179 152   BMET Recent Labs    03/20/18 0348 03/21/18 0241  NA 139 140  K 3.7 3.9  CL 102 100  CO2 28 30  GLUCOSE 170* 178*  BUN 17 16  CREATININE 0.71 0.73  CALCIUM 7.7* 7.4*   PT/INR No results for input(s): LABPROT, INR in the last 72 hours. CMP     Component Value Date/Time   NA 140 03/21/2018 0241   K 3.9 03/21/2018 0241   CL 100 03/21/2018 0241   CO2 30 03/21/2018 0241   GLUCOSE 178 (H) 03/21/2018 0241   BUN 16 03/21/2018 0241   CREATININE 0.73 03/21/2018 0241   CALCIUM 7.4 (L) 03/21/2018 0241   PROT 4.6 (L) 03/21/2018 0241   ALBUMIN 2.2 (L) 03/21/2018 0241   AST 32 03/21/2018 0241   ALT 33 03/21/2018 0241   ALKPHOS 47 03/21/2018 0241   BILITOT 1.0 03/21/2018 0241   GFRNONAA >60 03/21/2018 0241   GFRAA >60 03/21/2018 0241   Lipase     Component Value Date/Time   LIPASE 50 03/21/2018 0241    Studies/Results: Ct Abdomen Pelvis W Contrast  Result Date: 03/22/2018 CLINICAL DATA:  Inpatient.  Necrotizing gallstone pancreatitis. EXAM: CT ABDOMEN AND PELVIS WITH CONTRAST TECHNIQUE: Multidetector CT imaging of the abdomen and pelvis was performed using the standard protocol following bolus administration of intravenous contrast. CONTRAST:  100mL ISOVUE-300 IOPAMIDOL (ISOVUE-300) INJECTION 61% COMPARISON:  04/02/2018 CT abdomen/pelvis.  03/17/2018 MRI abdomen. FINDINGS: Lower chest: Small dependent bilateral pleural effusions, mildly increased bilaterally. Dependent atelectasis at both lung bases. Hepatobiliary: Normal liver size. No liver mass. Cholelithiasis. No definite  gallbladder wall thickening. No biliary ductal dilatation. CBD diameter 5 mm. Pancreas: There is absence of parenchymal enhancement throughout much of the pancreatic body and portions of the pancreatic tail, compatible with necrotizing pancreatitis, significantly worsened since 03/16/2018 CT and similar to slightly worsened since 03/17/2018 MRI. Enhancement is preserved in the pancreatic head. Diffuse peripancreatic fluid and fat stranding is similar. No discrete pancreatic mass. No pancreatic duct dilation. Spleen: Normal size. No mass. Adrenals/Urinary Tract: Normal adrenals. No hydronephrosis. Subcentimeter hypodense renal cortical lesions in the mid to lower right kidney are too small to characterize and unchanged. Normal bladder. Stomach/Bowel: Enteric tube terminates in the distal stomach. Stomach appears unremarkable. Diffuse moderate small bowel wall thickening without significant small bowel dilatation, worsened from prior CT. Normal appendix. Normal large bowel with no diverticulosis, large bowel wall thickening or pericolonic fat stranding. Vascular/Lymphatic: Atherosclerotic nonaneurysmal abdominal aorta. Patent portal, splenic, hepatic and renal veins. Enlarged 1.9 cm porta hepatis node (series 3/image 34), increased from 1.4 cm. No additional pathologically enlarged lymph nodes in the abdomen or pelvis. Reproductive: Top-normal size prostate with nonspecific internal prostatic calcifications. Other: No pneumoperitoneum. Small to moderate volume ascites, predominantly perihepatic, increased. No focal measurable fluid collection. Mild anasarca, new. Musculoskeletal: No aggressive appearing focal osseous lesions. Moderate thoracolumbar spondylosis. IMPRESSION: 1. Necrotizing pancreatitis as detailed, similar to slightly worsened since 03/17/2018 MRI, significantly worsened since 03/16/2018 CT. 2. Worsening third-spacing of fluid, with increased small to moderate volume ascites, increased small dependent  bilateral pleural effusions, new anasarca and increased diffuse small bowel wall thickening. 3. Mild porta hepatis adenopathy is increased and probably reactive. 4. Cholelithiasis.  No biliary ductal dilatation. Electronically Signed   By: Delbert PhenixJason A Poff M.D.   On: 03/22/2018 09:06      Jerre SimonJessica L Ilija Maxim , [facility] 03/22/2018, 9:40 AM  Pager: [phone] Mon-Wed, Friday 7:00am-4:30pm Thurs 7am-11:30am  Consults: [phone]

## 2018-03-23 LAB — GLUCOSE, CAPILLARY
GLUCOSE-CAPILLARY: 223 mg/dL — AB (ref 70–99)
GLUCOSE-CAPILLARY: 297 mg/dL — AB (ref 70–99)
Glucose-Capillary: 151 mg/dL — ABNORMAL HIGH (ref 70–99)
Glucose-Capillary: 198 mg/dL — ABNORMAL HIGH (ref 70–99)
Glucose-Capillary: 227 mg/dL — ABNORMAL HIGH (ref 70–99)

## 2018-03-23 LAB — CBC
HCT: 45.1 % (ref 39.0–52.0)
Hemoglobin: 14.8 g/dL (ref 13.0–17.0)
MCH: 30.6 pg (ref 26.0–34.0)
MCHC: 32.8 g/dL (ref 30.0–36.0)
MCV: 93.2 fL (ref 78.0–100.0)
PLATELETS: 221 10*3/uL (ref 150–400)
RBC: 4.84 MIL/uL (ref 4.22–5.81)
RDW: 13.3 % (ref 11.5–15.5)
WBC: 11.9 10*3/uL — ABNORMAL HIGH (ref 4.0–10.5)

## 2018-03-23 LAB — COMPREHENSIVE METABOLIC PANEL
ALBUMIN: 2.8 g/dL — AB (ref 3.5–5.0)
ALK PHOS: 67 U/L (ref 38–126)
ALT: 35 U/L (ref 0–44)
AST: 34 U/L (ref 15–41)
Anion gap: 6 (ref 5–15)
BILIRUBIN TOTAL: 1.1 mg/dL (ref 0.3–1.2)
BUN: 13 mg/dL (ref 8–23)
CALCIUM: 7.9 mg/dL — AB (ref 8.9–10.3)
CO2: 35 mmol/L — AB (ref 22–32)
CREATININE: 0.8 mg/dL (ref 0.61–1.24)
Chloride: 97 mmol/L — ABNORMAL LOW (ref 98–111)
GFR calc Af Amer: >60 mL/min (ref >60)
GFR calc non Af Amer: >60 mL/min (ref >60)
GLUCOSE: 222 mg/dL — AB (ref 70–99)
Potassium: 3.3 mmol/L — ABNORMAL LOW (ref 3.5–5.1)
SODIUM: 138 mmol/L (ref 135–145)
TOTAL PROTEIN: 5.6 g/dL — AB (ref 6.5–8.1)

## 2018-03-23 LAB — MAGNESIUM: Magnesium: 2.3 mg/dL (ref 1.7–2.4)

## 2018-03-23 LAB — PROTIME-INR
INR: 1.03
Prothrombin Time: 13.4 seconds (ref 11.4–15.2)

## 2018-03-23 LAB — PHOSPHORUS: PHOSPHORUS: 2.6 mg/dL (ref 2.5–4.6)

## 2018-03-23 MED ORDER — POTASSIUM CHLORIDE 10 MEQ/100ML IV SOLN
10.0000 meq | INTRAVENOUS | Status: AC
  Administered 2018-03-23 (×2): 10 meq via INTRAVENOUS
  Filled 2018-03-23 (×2): qty 100

## 2018-03-23 MED ORDER — FAMOTIDINE 20 MG PO TABS
20.0000 mg | ORAL_TABLET | Freq: Two times a day (BID) | ORAL | Status: DC
  Administered 2018-03-23 – 2018-03-26 (×6): 20 mg via ORAL
  Filled 2018-03-23 (×7): qty 1

## 2018-03-23 MED ORDER — VITAL AF 1.2 CAL PO LIQD: 1000.0000 mL | ORAL | Status: DC

## 2018-03-23 MED ORDER — VITAL AF 1.2 CAL PO LIQD
1500.0000 mL | ORAL | Status: DC
  Filled 2018-03-23: qty 1500

## 2018-03-23 MED ORDER — VITAL AF 1.2 CAL PO LIQD
1000.0000 mL | ORAL | Status: DC
  Filled 2018-03-23: qty 1000
  Filled 2018-03-23: qty 1500

## 2018-03-23 MED ORDER — BOOST / RESOURCE BREEZE PO LIQD CUSTOM
1.0000 | Freq: Three times a day (TID) | ORAL | Status: DC
  Administered 2018-03-23: 1 via ORAL

## 2018-03-23 MED ORDER — FUROSEMIDE 10 MG/ML IJ SOLN
40.0000 mg | Freq: Two times a day (BID) | INTRAMUSCULAR | Status: DC
  Administered 2018-03-23 – 2018-03-25 (×6): 40 mg via INTRAVENOUS
  Filled 2018-03-23 (×5): qty 4

## 2018-03-23 MED ORDER — VITAL AF 1.2 CAL PO LIQD
1000.0000 mL | ORAL | Status: DC
  Filled 2018-03-23: qty 1000

## 2018-03-23 MED ORDER — LOPERAMIDE HCL 2 MG PO CAPS: 2.0000 mg | ORAL_CAPSULE | Freq: Four times a day (QID) | ORAL | Status: DC | PRN

## 2018-03-23 MED ORDER — LOPERAMIDE HCL 2 MG PO CAPS
4.0000 mg | ORAL_CAPSULE | Freq: Once | ORAL | Status: AC
  Administered 2018-03-23: 4 mg via ORAL
  Filled 2018-03-23: qty 2

## 2018-03-23 MED ORDER — CARVEDILOL 6.25 MG PO TABS
6.2500 mg | ORAL_TABLET | Freq: Two times a day (BID) | ORAL | Status: DC
  Administered 2018-03-23 – 2018-03-26 (×6): 6.25 mg via ORAL
  Filled 2018-03-23 (×6): qty 1

## 2018-03-23 MED ORDER — VITAL AF 1.2 CAL PO LIQD
1500.0000 mL | ORAL | Status: DC
  Administered 2018-03-23: 1500 mL
  Filled 2018-03-23 (×2): qty 1500

## 2018-03-23 MED ORDER — VITAL AF 1.2 CAL PO LIQD
1000.0000 mL | ORAL | Status: DC
  Administered 2018-03-23: 1000 mL
  Filled 2018-03-23: qty 1000

## 2018-03-23 MED ORDER — HYDRALAZINE HCL 20 MG/ML IJ SOLN: 10.0000 mg | Freq: Four times a day (QID) | INTRAMUSCULAR | Status: DC | PRN

## 2018-03-23 NOTE — Progress Notes (Signed)
Daily Rounding Note  03/23/2018, 4:42 PM  LOS: 7 days   SUBJECTIVE:   Lingering, mild to moderate mid to lower abd pain Loose stool this AM, but after imodium has had no BM's. Appetite not great but able to eat solids and working on increasing his po intake of low fat diet.  No nausea   Walking in halls with assist, still feels weak but overall stronger  OBJECTIVE:         Vital signs in last 24 hours:    Temp:  [98 F (36.7 C)-99 F (37.2 C)] 98 F (36.7 C) (08/20 1048) Pulse Rate:  [95-100] 95 (08/20 1048) Resp:  [17-23] 21 (08/20 1048) BP: (130-159)/(64-81) 159/77 (08/20 1048) SpO2:  [93 %-99 %] 96 % (08/20 1048) Last BM Date: 03/23/18 Filed Weights   03/20/18 0354 03/21/18 0502 03/22/18 0415  Weight: 87.8 kg 86 kg 82.6 kg   General: looks moderately ill.  comfortable   Heart: RRR Chest: clear bil.  No cough or dyspnea Abdomen: soft, distended, tender without guarding/rebound in area below navel  Extremities: no CCE Neuro/Psych:  Oriented x 3.  Fully alert.  No obvious weakness of deficits.  Intake/Output from previous day: 08/19 0701 - 08/20 0700 In: 1365.2 [I.V.:278.3; IV Piggyback:1086.9] Out: 1900 [Urine:1900]  Intake/Output this shift: Total I/O In: 3040.4 [I.V.:204.4; NG/GT:2415.8; IV Piggyback:420.3] Out: 1260 [Urine:1260]  Lab Results: Recent Labs    03/21/18 0241 03/22/18 0357 03/23/18 0355  WBC 11.2* 11.3* 11.9*  HGB 14.1 14.2 14.8  HCT 42.4 43.5 45.1  PLT 152 180 221   BMET Recent Labs    03/21/18 0241 03/22/18 0357 03/23/18 0355  NA 140 140 138  K 3.9 3.6 3.3*  CL 100 101 97*  CO2 30 31 35*  GLUCOSE 178* 191* 222*  BUN 16 16 13   CREATININE 0.73 0.57* 0.80  CALCIUM 7.4* 7.5* 7.9*   LFT Recent Labs    03/21/18 0241 03/22/18 0357 03/23/18 0355  PROT 4.6* 4.9* 5.6*  ALBUMIN 2.2* 2.4* 2.8*  AST 32 31 34  ALT 33 30 35  ALKPHOS 47 55 67  BILITOT 1.0 0.9 1.1    PT/INR Recent Labs    03/23/18 0355  LABPROT 13.4  INR 1.03   Hepatitis Panel No results for input(s): HEPBSAG, HCVAB, HEPAIGM, HEPBIGM in the last 72 hours.  Studies/Results: Ct Abdomen Pelvis W Contrast  Result Date: 03/22/2018 CLINICAL DATA:  Inpatient.  Necrotizing gallstone pancreatitis. EXAM: CT ABDOMEN AND PELVIS WITH CONTRAST TECHNIQUE: Multidetector CT imaging of the abdomen and pelvis was performed using the standard protocol following bolus administration of intravenous contrast. CONTRAST:  100mL ISOVUE-300 IOPAMIDOL (ISOVUE-300) INJECTION 61% COMPARISON:  04/02/2018 CT abdomen/pelvis.  03/17/2018 MRI abdomen. FINDINGS: Lower chest: Small dependent bilateral pleural effusions, mildly increased bilaterally. Dependent atelectasis at both lung bases. Hepatobiliary: Normal liver size. No liver mass. Cholelithiasis. No definite gallbladder wall thickening. No biliary ductal dilatation. CBD diameter 5 mm. Pancreas: There is absence of parenchymal enhancement throughout much of the pancreatic body and portions of the pancreatic tail, compatible with necrotizing pancreatitis, significantly worsened since 03/16/2018 CT and similar to slightly worsened since 03/17/2018 MRI. Enhancement is preserved in the pancreatic head. Diffuse peripancreatic fluid and fat stranding is similar. No discrete pancreatic mass. No pancreatic duct dilation. Spleen: Normal size. No mass. Adrenals/Urinary Tract: Normal adrenals. No hydronephrosis. Subcentimeter hypodense renal cortical lesions in the mid to lower right kidney are too small to characterize and unchanged.  Normal bladder. Stomach/Bowel: Enteric tube terminates in the distal stomach. Stomach appears unremarkable. Diffuse moderate small bowel wall thickening without significant small bowel dilatation, worsened from prior CT. Normal appendix. Normal large bowel with no diverticulosis, large bowel wall thickening or pericolonic fat stranding.  Vascular/Lymphatic: Atherosclerotic nonaneurysmal abdominal aorta. Patent portal, splenic, hepatic and renal veins. Enlarged 1.9 cm porta hepatis node (series 3/image 34), increased from 1.4 cm. No additional pathologically enlarged lymph nodes in the abdomen or pelvis. Reproductive: Top-normal size prostate with nonspecific internal prostatic calcifications. Other: No pneumoperitoneum. Small to moderate volume ascites, predominantly perihepatic, increased. No focal measurable fluid collection. Mild anasarca, new. Musculoskeletal: No aggressive appearing focal osseous lesions. Moderate thoracolumbar spondylosis. IMPRESSION: 1. Necrotizing pancreatitis as detailed, similar to slightly worsened since 03/17/2018 MRI, significantly worsened since 03/16/2018 CT. 2. Worsening third-spacing of fluid, with increased small to moderate volume ascites, increased small dependent bilateral pleural effusions, new anasarca and increased diffuse small bowel wall thickening. 3. Mild porta hepatis adenopathy is increased and probably reactive. 4. Cholelithiasis.  No biliary ductal dilatation. Electronically Signed   By: Delbert PhenixJason A Poff M.D.   On: 03/22/2018 09:06    ASSESMENT:   *   Severe acute gallstone pancreatitis with necrosis.   Mild leukocytosis persists.  Day 6 Meropenem.   On tube feeds, rate now at 40 ml/hr,  but now tolerating solid, low fat diet   *  Loose stools,  C diff negative.    *   Hypokalemia.    *   DM.     PLAN   *   Needs total 3 weeks abx.  Trying to arrange for outpt IV carbapenem.      Jennye MoccasinSarah Jquan Egelston  03/23/2018, 4:42 PM Phone [phone]

## 2018-03-23 NOTE — Progress Notes (Signed)
Nutrition Consult/Follow Up  DOCUMENTATION CODES:   Obesity unspecified  INTERVENTION:    Nocturnal TF regimen: Vital AF 1.2 at 80 ml/hr x 12 hrs (infuse 7 PM to 7 AM) via Cortrak  Boost Breeze po TID, each supplement provides 250 kcal, 0 gm fat, 9 gm protein  Once pt is consuming >/= 75% of meals and drinking supplements can D/C tube feeding  NEW NUTRITION DIAGNOSIS:   Increased nutrient needs related to acute illness(necrotizing pancreatitis) as evidenced by estimated needs, ongoing  GOAL:   Patient will meet greater than or equal to 90% of their needs, met  MONITOR:   PO intake, Supplement acceptance, Labs, Skin, Weight trends, I & O's  ASSESSMENT:   67 yo Male with PMH of dyslipidemia presented with acute gallstone pancreatitis, MRI on 8/14 suggestive of necrotizing pancreatitis.    8/16 Cortrak placed -- tip at descending part of duodenum  RD spoke with pt at bedside. He is in good spirits. Feeling a bit better every day. Advanced to a Heart Healthy diet. Pt ordered a grilled chicken sandwich for dinner. Will add clear liquid nutrition supplement to maximize kcal, protein intake. Labs and medications reviewed. K 3.3 (L). CBG's [phone].  Plan is to transition pt to nocturnal TF regimen. Discussed nutrition care plan with Dr. Candiss Norse and Jenny Reichmann, RN.  Diet Order:   Diet Order            Diet Heart Room service appropriate? Yes; Fluid consistency: Thin  Diet effective 0500             EDUCATION NEEDS:   Not appropriate for education at this time  Skin:  Skin Assessment: Reviewed RN Assessment  Last BM:  8/19   Intake/Output Summary (Last 24 hours) at 03/23/2018 1511 Last data filed at 03/23/2018 1428 Gross per 24 hour  Intake 4405.61 ml  Output 1810 ml  Net 2595.61 ml   Height:   Ht Readings from Last 1 Encounters:  03/16/18 _0  (1.651 m)   Weight:   Wt Readings from Last 1 Encounters:  03/22/18 82.6 kg   Ideal Body Weight:  61.8  kg  BMI:  Body mass index is 30.3 kg/m.  Estimated Nutritional Needs:   Kcal:  4591-3685  Protein:  130-145 gm  Fluid:  2.3-2.5 L  Arthur Holms, RD, LDN Pager #: [phone] After-Hours Pager #: [phone]

## 2018-03-23 NOTE — Progress Notes (Signed)
PROGRESS NOTE        PATIENT DETAILS Name: Ryan Chan Age: 67 y.o. Sex: male Date of Birth: 14-Jun-1951 Admit Date: 03/16/2018 Admitting Physician Jonah Blue, MD ZOX:[id number], Pcp Not In  Brief Narrative: Patient is a 67 y.o. male with a history of hypertension, dyslipidemia presented with acute gallstone pancreatitis, MRI on 8/14 suggestive of necrotizing pancreatitis.  Improving with supportive care, hospital course complicated by volume overload with mild acute pulmonary edema and fever.  Started on empiric meropenem, postpyloric NG tube feedings, with plans to repeat imaging in the next few days.  General surgery and GI following.  See below for further details.    Subjective: Patient in bed, overall continues to feel better, denies any chest pain or shortness of breath, epigastric abdominal pain has almost completely resolved, no nausea.  Passing flatus having bowel movements which are somewhat loose and multiple.  Assessment/Plan:  Acute necrotizing gallstone pancreatitis: Medically he is definitely improving despite worsening CT and radiological findings, currently tolerating postpyloric NG tube feeds, continues to be on empiric meropenem due to low-grade fevers in the setting of necrotizing pancreatitis, cultures negative, now afebrile and feels better, continue pain control and supportive care.  Has developed significant edema and anasarca hence IV fluids stopped and he is being gently diuresed, since blood pressures are stable continue diuresis with Lasix as tolerated.  GI and general surgery on board.    GI has started him on some oral diet on 03/23/2018, continue feeds in half, continue oral diet, have consulted nutritionist to do calorie counting, if continues to tolerate oral diet goal will be to stop tube feeds, transition him to oral antibiotics and if clinically stable discharge home in the next 3 to 4 days.  GI wants total 2 weeks of  antibiotics, if he is tolerating oral antibiotics well and is not getting clinically worse can be transitioned to oral Vantin and Flagyl in the next few days, case discussed with GI on 03/23/2018.   Osmolar Diarrhea - C diff -ve, imodium. PRN Imodium.  Volume overload with mild pulmonary edema: Plan as above, continue gentle diuresis, reviewed transthoracic echocardiogram done at [facility] June 2019 showed a preserved EF.  He also had a negative stress test in June 2019 as well.  Acute kidney injury: Hemodynamically mediated in the setting of necrotizing pancreatitis resolved after IV fluids.  Hypophosphatemia.  Replaced and stable.  Hypokalemia.  Replaced will monitor.    Hypertension: Since has been started on oral diet will start the patient on low-dose Coreg along with PRN hydralazine IV.   DVT Prophylaxis: Prophylactic Lovenox   Code Status: Full code   Family Communication: None at bedside-did speak with spouse at bedside yesterday.  Disposition Plan: Remain inpatient-will require several days of hospitalization  Antimicrobial agents: Anti-infectives (From admission, onward)   Start     Dose/Rate Route Frequency Ordered Stop   03/17/18 1600  meropenem (MERREM) 1 g in sodium chloride 0.9 % 100 mL IVPB     1 g 200 mL/hr over 30 Minutes Intravenous Every 8 hours 03/17/18 1507     03/17/18 1300  ampicillin-sulbactam (UNASYN) 1.5 g in sodium chloride 0.9 % 100 mL IVPB  Status:  Discontinued     1.5 g 200 mL/hr over 30 Minutes Intravenous Every 6 hours 03/17/18 1132 03/17/18 1440      Procedures:  None  CONSULTS:  GI CCS  Time spent:  25 minutes-Greater than 50% of this time was spent in counseling, explanation of diagnosis, planning of further management, and coordination of care.  MEDICATIONS: Scheduled Meds: . enoxaparin (LOVENOX) injection  40 mg Subcutaneous Q24H  . furosemide  40 mg Intravenous BID  . insulin aspart  0-15 Units Subcutaneous TID WC   . insulin aspart  0-5 Units Subcutaneous QHS  . metoprolol tartrate  25 mg Oral BID  . nicotine  21 mg Transdermal Daily  . polyethylene glycol  17 g Oral Daily  . potassium chloride  20 mEq Oral Daily   Continuous Infusions: . famotidine (PEPCID) IV Stopped (03/22/18 2257)  . feeding supplement (VITAL AF 1.2 CAL) 80 mL/hr at 03/23/18 0508  . lactated ringers 20 mL/hr at 03/23/18 0311  . meropenem (MERREM) IV 1 g (03/23/18 1052)  . potassium chloride 10 mEq (03/23/18 1111)   PRN Meds:.acetaminophen **OR** acetaminophen, alum & mag hydroxide-simeth, bisacodyl, chlorproMAZINE (THORAZINE) injection, hydrALAZINE, HYDROmorphone (DILAUDID) injection, ondansetron **OR** ondansetron (ZOFRAN) IV, oxyCODONE, senna-docusate   PHYSICAL EXAM: Vital signs: Vitals:   03/22/18 2126 03/22/18 2335 03/23/18 0424 03/23/18 1048  BP: 130/66 (!) 155/81 (!) 146/77 (!) 159/77  Pulse:    95  Resp:  (!) 23 18 (!) 21  Temp:  98.4 F (36.9 C) 99 F (37.2 C) 98 F (36.7 C)  TempSrc:  Oral Oral Oral  SpO2:  99% 98% 96%  Weight:      Height:       Filed Weights   03/20/18 0354 03/21/18 0502 03/22/18 0415  Weight: 87.8 kg 86 kg 82.6 kg   Body mass index is 30.3 kg/m.    Exam  Awake Alert, Oriented X 3, No new F.N deficits, Normal affect [place].AT,PERRAL, NG tube in place,  Supple Neck,No JVD, No cervical lymphadenopathy appriciated.  Symmetrical Chest wall movement, Good air movement bilaterally, CTAB RRR,No Gallops, Rubs or new Murmurs, No Parasternal Heave +ve B.Sounds, Abd Soft, minimal epigastric tenderness, No organomegaly appriciated, No rebound - guarding or rigidity. No Cyanosis, Clubbing or edema, No new Rash or bruise  I have personally reviewed following labs and imaging studies  LABORATORY DATA: CBC: Recent Labs  Lab 03/19/18 0718 03/20/18 0348 03/21/18 0241 03/22/18 0357 03/23/18 0355  WBC 9.3 12.4* 11.2* 11.3* 11.9*  HGB 15.4 15.1 14.1 14.2 14.8  HCT 46.1 44.6 42.4 43.5 45.1   MCV 91.7 92.3 93.8 94.6 93.2  PLT 153 179 152 180 221    Basic Metabolic Panel: Recent Labs  Lab 03/19/18 0718 03/20/18 0348 03/21/18 0241 03/22/18 0357 03/23/18 0355  NA 139 139 140 140 138  K 3.6 3.7 3.9 3.6 3.3*  CL 103 102 100 101 97*  CO2 29 28 30 31  35*  GLUCOSE 156* 170* 178* 191* 222*  BUN 20 17 16 16 13   CREATININE 0.75 0.71 0.73 0.57* 0.80  CALCIUM 7.6* 7.7* 7.4* 7.5* 7.9*  MG  --  2.3  --  2.2 2.3  PHOS  --  1.2*  --  2.1* 2.6    GFR: Estimated Creatinine Clearance: 88.6 mL/min (by C-G formula based on SCr of 0.8 mg/dL).  Liver Function Tests: Recent Labs  Lab 03/19/18 0718 03/20/18 0348 03/21/18 0241 03/22/18 0357 03/23/18 0355  AST 39 37 32 31 34  ALT 44 37 33 30 35  ALKPHOS 39 45 47 55 67  BILITOT 1.5* 1.3* 1.0 0.9 1.1  PROT 4.9* 4.9* 4.6* 4.9* 5.6*  ALBUMIN 2.5*  2.4* 2.2* 2.4* 2.8*   Recent Labs  Lab 03/18/18 0337 03/21/18 0241  LIPASE 934* 50   No results for input(s): AMMONIA in the last 168 hours.  Coagulation Profile: Recent Labs  Lab 03/23/18 0355  INR 1.03    Cardiac Enzymes: No results for input(s): CKTOTAL, CKMB, CKMBINDEX, TROPONINI in the last 168 hours.  BNP (last 3 results) No results for input(s): PROBNP in the last 8760 hours.  HbA1C: No results for input(s): HGBA1C in the last 72 hours.  CBG: Recent Labs  Lab 03/22/18 1605 03/22/18 2111 03/23/18 0012 03/23/18 0502 03/23/18 1118  GLUCAP 233* 220* 227* 223* 297*    Lipid Profile: No results for input(s): CHOL, HDL, LDLCALC, TRIG, CHOLHDL, LDLDIRECT in the last 72 hours.  Thyroid Function Tests: No results for input(s): TSH, T4TOTAL, FREET4, T3FREE, THYROIDAB in the last 72 hours.  Anemia Panel: No results for input(s): VITAMINB12, FOLATE, FERRITIN, TIBC, IRON, RETICCTPCT in the last 72 hours.  Urine analysis: No results found for: COLORURINE, APPEARANCEUR, LABSPEC, PHURINE, GLUCOSEU, HGBUR, BILIRUBINUR, KETONESUR, PROTEINUR, UROBILINOGEN, NITRITE,  LEUKOCYTESUR  Sepsis Labs: Lactic Acid, Venous No results found for: LATICACIDVEN  MICROBIOLOGY: Recent Results (from the past 240 hour(s))  Culture, blood (routine x 2)     Status: None (Preliminary result)   Collection Time: 03/19/18  2:43 PM  Result Value Ref Range Status   Specimen Description BLOOD LEFT ARM  Final   Special Requests   Final    BOTTLES DRAWN AEROBIC AND ANAEROBIC Blood Culture adequate volume   Culture   Final    NO GROWTH 3 DAYS Performed at [facility], [address] [place], [place] [address]    Report Status PENDING  Incomplete  Culture, blood (routine x 2)     Status: None (Preliminary result)   Collection Time: 03/19/18  2:51 PM  Result Value Ref Range Status   Specimen Description BLOOD RIGHT HAND  Final   Special Requests   Final    BOTTLES DRAWN AEROBIC AND ANAEROBIC Blood Culture adequate volume   Culture   Final    NO GROWTH 3 DAYS Performed at [facility], [address] [place], [place] [address]    Report Status PENDING  Incomplete  C difficile quick scan w PCR reflex     Status: None   Collection Time: 03/22/18  4:22 PM  Result Value Ref Range Status   C Diff antigen NEGATIVE NEGATIVE Final   C Diff toxin NEGATIVE NEGATIVE Final   C Diff interpretation No C. difficile detected.  Final    Comment: Performed at [facility], [address] [place], [place] [address]    RADIOLOGY STUDIES/RESULTS: Ct Abdomen Pelvis W Contrast  Result Date: 03/22/2018 CLINICAL DATA:  Inpatient.  Necrotizing gallstone pancreatitis. EXAM: CT ABDOMEN AND PELVIS WITH CONTRAST TECHNIQUE: Multidetector CT imaging of the abdomen and pelvis was performed using the standard protocol following bolus administration of intravenous contrast. CONTRAST:  100mL ISOVUE-300 IOPAMIDOL (ISOVUE-300) INJECTION 61% COMPARISON:  04/02/2018 CT abdomen/pelvis.  03/17/2018 MRI abdomen. FINDINGS: Lower chest: Small dependent bilateral pleural effusions, mildly  increased bilaterally. Dependent atelectasis at both lung bases. Hepatobiliary: Normal liver size. No liver mass. Cholelithiasis. No definite gallbladder wall thickening. No biliary ductal dilatation. CBD diameter 5 mm. Pancreas: There is absence of parenchymal enhancement throughout much of the pancreatic body and portions of the pancreatic tail, compatible with necrotizing pancreatitis, significantly worsened since 03/16/2018 CT and similar to slightly worsened since 03/17/2018 MRI.  Enhancement is preserved in the pancreatic head. Diffuse peripancreatic fluid and fat stranding is similar. No discrete pancreatic mass. No pancreatic duct dilation. Spleen: Normal size. No mass. Adrenals/Urinary Tract: Normal adrenals. No hydronephrosis. Subcentimeter hypodense renal cortical lesions in the mid to lower right kidney are too small to characterize and unchanged. Normal bladder. Stomach/Bowel: Enteric tube terminates in the distal stomach. Stomach appears unremarkable. Diffuse moderate small bowel wall thickening without significant small bowel dilatation, worsened from prior CT. Normal appendix. Normal large bowel with no diverticulosis, large bowel wall thickening or pericolonic fat stranding. Vascular/Lymphatic: Atherosclerotic nonaneurysmal abdominal aorta. Patent portal, splenic, hepatic and renal veins. Enlarged 1.9 cm porta hepatis node (series 3/image 34), increased from 1.4 cm. No additional pathologically enlarged lymph nodes in the abdomen or pelvis. Reproductive: Top-normal size prostate with nonspecific internal prostatic calcifications. Other: No pneumoperitoneum. Small to moderate volume ascites, predominantly perihepatic, increased. No focal measurable fluid collection. Mild anasarca, new. Musculoskeletal: No aggressive appearing focal osseous lesions. Moderate thoracolumbar spondylosis. IMPRESSION: 1. Necrotizing pancreatitis as detailed, similar to slightly worsened since 03/17/2018 MRI, significantly  worsened since 03/16/2018 CT. 2. Worsening third-spacing of fluid, with increased small to moderate volume ascites, increased small dependent bilateral pleural effusions, new anasarca and increased diffuse small bowel wall thickening. 3. Mild porta hepatis adenopathy is increased and probably reactive. 4. Cholelithiasis.  No biliary ductal dilatation. Electronically Signed   By: Delbert Phenix M.D.   On: 03/22/2018 09:06   Ct Abdomen Pelvis W Contrast  Result Date: 03/16/2018 CLINICAL DATA:  67 y/o  M; epigastric pain. EXAM: CT ABDOMEN AND PELVIS WITH CONTRAST TECHNIQUE: Multidetector CT imaging of the abdomen and pelvis was performed using the standard protocol following bolus administration of intravenous contrast. CONTRAST:  100 cc Omnipaque 300 COMPARISON:  01/25/2008 CT of the abdomen and pelvis. FINDINGS: Lower chest: No acute abnormality. Hepatobiliary: Mild intra and extrahepatic biliary ductal dilatation with the common bile duct measuring up to 11 mm. No focal liver lesion. Cholelithiasis. Pancreas: Severe extensive edema surrounding the pancreas compatible with acute pancreatitis. No discrete acute peripancreatic collection. Spleen: Normal in size without focal abnormality. Adrenals/Urinary Tract: Adrenal glands are unremarkable. Kidneys are normal, without renal calculi, focal lesion, or hydronephrosis. Bladder is unremarkable. Stomach/Bowel: Stomach is within normal limits. Mild wall thickening of the duodenum, likely reactive inflammatory changes. Appendix appears normal. No evidence of bowel wall thickening, distention, or inflammatory changes of the jejunum, ileum, or colon. Vascular/Lymphatic: Severe mixed atherosclerosis of the aorta. Bilateral common iliac and internal iliac artery aneurysms. The right common iliac artery aneurysm measures 2 cm and left 1.7 cm. No lymphadenopathy. Reproductive: Prostate is unremarkable. Other: No abdominal wall hernia or abnormality. No abdominopelvic ascites.  Musculoskeletal: No fracture is seen. Moderate degenerative changes of the lumbar spine. IMPRESSION: 1. Acute pancreatitis with extensive retroperitoneal edema. No discrete acute peripancreatic collection identified at this time. Mild intra and extrahepatic biliary ductal dilatation with cholelithiasis may represent choledocholithiasis and gallstone pancreatitis. This can be further characterized with MRI/MRCP of the abdomen. 2. Severe aortic atherosclerosis with bilateral common iliac artery and internal iliac artery aneurysms. Electronically Signed   By: Mitzi Hansen M.D.   On: 03/16/2018 05:49   Mr 3d Recon At Scanner  Result Date: 03/17/2018 CLINICAL DATA:  Acute pancreatitis, etiology unknown. Cholelithiasis, diabetes and hepatitis C. EXAM: MRI ABDOMEN WITHOUT AND WITH CONTRAST (INCLUDING MRCP) TECHNIQUE: Multiplanar multisequence MR imaging of the abdomen was performed both before and after the administration of intravenous contrast. Heavily T2-weighted images of the  biliary and pancreatic ducts were obtained, and three-dimensional MRCP images were rendered by post processing. CONTRAST:  15mL MULTIHANCE GADOBENATE DIMEGLUMINE 529 MG/ML IV SOLN COMPARISON:  Abdominal CT 03/16/2018 and 01/25/2008. FINDINGS: Lower chest: New small bilateral pleural effusions with associated subsegmental atelectasis at both lung bases. Hepatobiliary: The liver is normal in appearance without morphologic changes of cirrhosis, focal lesion or abnormal enhancement. Large gallstones are again noted. The common hepatic duct measures 7 mm in diameter. There is no evidence of choledocholithiasis. Pancreas: There is moderate diffuse enlargement of the pancreas with extensive surrounding inflammatory changes. Prior to contrast, there is heterogeneous increased T1 signal throughout the pancreatic body and tail, suspicious for hemorrhage. Following contrast, there is very poor enhancement of the pancreas, especially in the  body and tail. Portions of the pancreatic head enhance normally. There is no pancreatic ductal dilatation. Normal pancreatic ductal anatomy. Spleen: Normal in size without focal abnormality. Adrenals/Urinary Tract: Both adrenal glands appear normal. Small bilateral renal cysts. No evidence of renal mass or hydronephrosis. Stomach/Bowel: No evidence of bowel wall thickening or focal inflammation. Stable mild colonic dilatation, most consistent with an ileus. Vascular/Lymphatic: There are no enlarged abdominal lymph nodes. No significant vascular findings. The portal, superior mesenteric and splenic veins are patent. Other: There is a moderate amount of ascites. There is extensive inflammation and ill-defined fluid throughout the retroperitoneal and mesenteric fat. No well-defined fluid collections are demonstrated. Musculoskeletal: No acute or significant osseous findings. IMPRESSION: 1. Worsening severe acute pancreatitis with suspicion of hemorrhagic components. There is a very poor enhancement of most of the pancreas, consistent with necrotizing pancreatitis and implying a poor prognosis. 2. Associated increased ascites, inflammation and ill-defined fluid throughout the intra-abdominal fat, small bilateral pleural effusions and bibasilar atelectasis. 3. Cholelithiasis without evidence of significant biliary dilatation or choledocholithiasis. Electronically Signed   By: Carey Bullocks M.D.   On: 03/17/2018 10:41   Dg Chest Port 1 View  Result Date: 03/17/2018 CLINICAL DATA:  Short of breath and abdominal pain.  Pancreatitis. EXAM: PORTABLE CHEST 1 VIEW COMPARISON:  01/20/2018 FINDINGS: Midline trachea. Normal heart size for level of inspiration. Aortic atherosclerosis. Probable layering small left pleural effusion. No pneumothorax. Low lung volumes with resultant pulmonary interstitial prominence. Left greater than right base airspace disease is new. IMPRESSION: Low lung volumes with probable small left  pleural effusion and bibasilar airspace disease. Airspace disease is favored to represent atelectasis. Especially at the left lung base, infection or aspiration cannot be excluded Aortic Atherosclerosis (ICD10-I70.0). Electronically Signed   By: Jeronimo Greaves M.D.   On: 03/17/2018 17:26   Dg Abd Portable 1v  Result Date: 03/19/2018 CLINICAL DATA:  Feeding tube placement EXAM: PORTABLE ABDOMEN - 1 VIEW COMPARISON:  03/18/2018 FINDINGS: Feeding tube has been placed with the tip in the distal descending duodenum. Diffuse gaseous distention of bowel, likely ileus, similar to prior study. IMPRESSION: Feeding tube tip in the distal descending duodenum. Electronically Signed   By: Charlett Nose M.D.   On: 03/19/2018 10:36   Dg Abd Portable 2v  Result Date: 03/18/2018 CLINICAL DATA:  History of hypertension. EXAM: PORTABLE ABDOMEN - 2 VIEW COMPARISON:  None. FINDINGS: Small pleural effusions. Air-filled loops of large and small bowel are identified with air to the level the rectum. There is no definitive dilatation. No free air, portal venous gas, or pneumatosis. No other acute abnormalities identified. IMPRESSION: Mildly prominent air-filled loops of large and small bowel without obstruction. A mild ileus is not excluded. Tiny pleural effusions. No  other acute abnormalities. Electronically Signed   By: Gerome Samavid  Williams III M.D   On: 03/18/2018 09:18   Mr Abdomen Mrcp W Wo Contast  Result Date: 03/17/2018 CLINICAL DATA:  Acute pancreatitis, etiology unknown. Cholelithiasis, diabetes and hepatitis C. EXAM: MRI ABDOMEN WITHOUT AND WITH CONTRAST (INCLUDING MRCP) TECHNIQUE: Multiplanar multisequence MR imaging of the abdomen was performed both before and after the administration of intravenous contrast. Heavily T2-weighted images of the biliary and pancreatic ducts were obtained, and three-dimensional MRCP images were rendered by post processing. CONTRAST:  15mL MULTIHANCE GADOBENATE DIMEGLUMINE 529 MG/ML IV SOLN  COMPARISON:  Abdominal CT 03/16/2018 and 01/25/2008. FINDINGS: Lower chest: New small bilateral pleural effusions with associated subsegmental atelectasis at both lung bases. Hepatobiliary: The liver is normal in appearance without morphologic changes of cirrhosis, focal lesion or abnormal enhancement. Large gallstones are again noted. The common hepatic duct measures 7 mm in diameter. There is no evidence of choledocholithiasis. Pancreas: There is moderate diffuse enlargement of the pancreas with extensive surrounding inflammatory changes. Prior to contrast, there is heterogeneous increased T1 signal throughout the pancreatic body and tail, suspicious for hemorrhage. Following contrast, there is very poor enhancement of the pancreas, especially in the body and tail. Portions of the pancreatic head enhance normally. There is no pancreatic ductal dilatation. Normal pancreatic ductal anatomy. Spleen: Normal in size without focal abnormality. Adrenals/Urinary Tract: Both adrenal glands appear normal. Small bilateral renal cysts. No evidence of renal mass or hydronephrosis. Stomach/Bowel: No evidence of bowel wall thickening or focal inflammation. Stable mild colonic dilatation, most consistent with an ileus. Vascular/Lymphatic: There are no enlarged abdominal lymph nodes. No significant vascular findings. The portal, superior mesenteric and splenic veins are patent. Other: There is a moderate amount of ascites. There is extensive inflammation and ill-defined fluid throughout the retroperitoneal and mesenteric fat. No well-defined fluid collections are demonstrated. Musculoskeletal: No acute or significant osseous findings. IMPRESSION: 1. Worsening severe acute pancreatitis with suspicion of hemorrhagic components. There is a very poor enhancement of most of the pancreas, consistent with necrotizing pancreatitis and implying a poor prognosis. 2. Associated increased ascites, inflammation and ill-defined fluid throughout  the intra-abdominal fat, small bilateral pleural effusions and bibasilar atelectasis. 3. Cholelithiasis without evidence of significant biliary dilatation or choledocholithiasis. Electronically Signed   By: Carey BullocksWilliam  Veazey M.D.   On: 03/17/2018 10:41     LOS: 7 days   Susa RaringPrashant Singh, MD  [facility]  If 7PM-7AM, please contact night-coverage  Please page via [email or web address] [email or web address]-click on MD name and type text message  03/23/2018, 11:58 AM

## 2018-03-24 DIAGNOSIS — Z4659 Encounter for fitting and adjustment of other gastrointestinal appliance and device: Secondary | ICD-10-CM

## 2018-03-24 DIAGNOSIS — E119 Type 2 diabetes mellitus without complications: Secondary | ICD-10-CM

## 2018-03-24 DIAGNOSIS — E876 Hypokalemia: Secondary | ICD-10-CM

## 2018-03-24 DIAGNOSIS — K861 Other chronic pancreatitis: Secondary | ICD-10-CM

## 2018-03-24 DIAGNOSIS — D72829 Elevated white blood cell count, unspecified: Secondary | ICD-10-CM

## 2018-03-24 LAB — CBC
HCT: 41 % (ref 39.0–52.0)
HEMOGLOBIN: 13.4 g/dL (ref 13.0–17.0)
MCH: 30.4 pg (ref 26.0–34.0)
MCHC: 32.7 g/dL (ref 30.0–36.0)
MCV: 93 fL (ref 78.0–100.0)
PLATELETS: 252 10*3/uL (ref 150–400)
RBC: 4.41 MIL/uL (ref 4.22–5.81)
RDW: 13.2 % (ref 11.5–15.5)
WBC: 12.7 10*3/uL — ABNORMAL HIGH (ref 4.0–10.5)

## 2018-03-24 LAB — COMPREHENSIVE METABOLIC PANEL
ALK PHOS: 63 U/L (ref 38–126)
ALT: 44 U/L (ref 0–44)
ANION GAP: 6 (ref 5–15)
AST: 52 U/L — ABNORMAL HIGH (ref 15–41)
Albumin: 2.5 g/dL — ABNORMAL LOW (ref 3.5–5.0)
BILIRUBIN TOTAL: 1 mg/dL (ref 0.3–1.2)
BUN: 17 mg/dL (ref 8–23)
CALCIUM: 8 mg/dL — AB (ref 8.9–10.3)
CO2: 35 mmol/L — ABNORMAL HIGH (ref 22–32)
Chloride: 97 mmol/L — ABNORMAL LOW (ref 98–111)
Creatinine, Ser: 0.74 mg/dL (ref 0.61–1.24)
GFR calc Af Amer: >60 mL/min (ref >60)
Glucose, Bld: 267 mg/dL — ABNORMAL HIGH (ref 70–99)
POTASSIUM: 4.2 mmol/L (ref 3.5–5.1)
Sodium: 138 mmol/L (ref 135–145)
TOTAL PROTEIN: 5.1 g/dL — AB (ref 6.5–8.1)

## 2018-03-24 LAB — CULTURE, BLOOD (ROUTINE X 2)
Culture: NO GROWTH
Culture: NO GROWTH
SPECIAL REQUESTS: ADEQUATE
SPECIAL REQUESTS: ADEQUATE

## 2018-03-24 LAB — GLUCOSE, CAPILLARY
GLUCOSE-CAPILLARY: 266 mg/dL — AB (ref 70–99)
GLUCOSE-CAPILLARY: 267 mg/dL — AB (ref 70–99)
GLUCOSE-CAPILLARY: 289 mg/dL — AB (ref 70–99)
Glucose-Capillary: 256 mg/dL — ABNORMAL HIGH (ref 70–99)
Glucose-Capillary: 124 mg/dL — ABNORMAL HIGH (ref 70–99)
Glucose-Capillary: 149 mg/dL — ABNORMAL HIGH (ref 70–99)

## 2018-03-24 LAB — MAGNESIUM: MAGNESIUM: 2.3 mg/dL (ref 1.7–2.4)

## 2018-03-24 LAB — PHOSPHORUS: PHOSPHORUS: 2.8 mg/dL (ref 2.5–4.6)

## 2018-03-24 MED ORDER — VITAL AF 1.2 CAL PO LIQD
1500.0000 mL | ORAL | Status: DC
  Administered 2018-03-24: 1500 mL
  Filled 2018-03-24 (×2): qty 1500

## 2018-03-24 MED ORDER — HYDROCORTISONE 1 % EX CREA
1.0000 "application " | TOPICAL_CREAM | Freq: Three times a day (TID) | CUTANEOUS | Status: DC | PRN
  Administered 2018-03-24 – 2018-03-25 (×2): 1 via TOPICAL
  Filled 2018-03-24: qty 28

## 2018-03-24 MED ORDER — GLUCERNA SHAKE PO LIQD
237.0000 mL | Freq: Three times a day (TID) | ORAL | Status: DC
  Administered 2018-03-24 – 2018-03-25 (×4): 237 mL via ORAL

## 2018-03-24 NOTE — Progress Notes (Addendum)
Nutrition Follow Up  DOCUMENTATION CODES:   Obesity unspecified  INTERVENTION:    Nocturnal TF regimen: Vital AF 1.2 at 100 ml/hr x 8 hrs (infuse 9 PM to 5 AM) via Cortrak  Glucerna Shake po TID, each supplement provides 220 kcal and 10 grams of protein   D/C Boost Breeze  Once pt is consuming >/= 75% of meals and drinking all his supplements can D/C tube feeding  NUTRITION DIAGNOSIS:   Increased nutrient needs related to acute illness(necrotizing pancreatitis) as evidenced by estimated needs, ongoing  GOAL:   Patient will meet greater than or equal to 90% of their needs, progressing  MONITOR:   PO intake, Supplement acceptance, Labs, Skin, Weight trends, I & O's  ASSESSMENT:   67 yo Male with PMH of dyslipidemia presented with acute gallstone pancreatitis, MRI on 8/14 suggestive of necrotizing pancreatitis.    RD spoke with pt at bedside. He reports his pain is mild. At breakfast he consumed all of his cereal, milk and fruit. At lunch he consumed 1/2 tuna salad sandwich and all of his cottage cheese & fruit.  Does not like Boost Breeze supplement but enjoys chocolate Glucerna Shakes. Spoke with Isabelle CourseLydia, RN. Pt does not want to go home with the Cortrak tube. Labs and medications reviewed. CBG's [phone].  Will decrease nocturnal TF infusion duration.  Hopefully this will help increase his PO intake. Discussed nutrition care plan with Dr. Jomarie LongsJoseph and RN.  Diet Order:   Diet Order            Diet Heart Room service appropriate? Yes; Fluid consistency: Thin  Diet effective 0500             EDUCATION NEEDS:   Not appropriate for education at this time  Skin:  Skin Assessment: Reviewed RN Assessment  Last BM:  8/21   Intake/Output Summary (Last 24 hours) at 03/24/2018 1725 Last data filed at 03/24/2018 1631 Gross per 24 hour  Intake 1320.06 ml  Output 3850 ml  Net -2529.94 ml   Height:   Ht Readings from Last 1 Encounters:  03/16/18 5\' 5"  (1.651 m)    Weight:   Wt Readings from Last 1 Encounters:  03/24/18 77.4 kg   Ideal Body Weight:  61.8 kg  BMI:  Body mass index is 28.41 kg/m.  Estimated Nutritional Needs:   Kcal:  2300-2500  Protein:  130-145 gm  Fluid:  2.3-2.5 L  Maureen ChattersKatie Jalaysia Lobb, RD, LDN Pager #: [phone] After-Hours Pager #: [phone]

## 2018-03-24 NOTE — Progress Notes (Signed)
PROGRESS NOTE        PATIENT DETAILS Name: Ryan Chan Age: 67 y.o. Sex: male Date of Birth: 07-07-1951 Admit Date: 03/16/2018 Admitting Physician Jonah Blue, MD WUJ:[id number], Pcp Not In  Brief Narrative: Patient is a 67 y.o. male with a history of hypertension, dyslipidemia presented with acute gallstone pancreatitis, MRI on 8/14 suggestive of necrotizing pancreatitis.  Improving with supportive care, hospital course complicated by volume overload with mild acute pulmonary edema and fever.  Started on empiric meropenem, postpyloric NG tube feedings, with plans to repeat imaging in the next few days.  General surgery and GI following.  Subjective: feeling better overall, breathing improving, less diarrhea, denies any abdominal pain   Assessment/Plan:  Acute necrotizing gallstone pancreatitis: -clinically improving now -CT scan 8/13 acute pancreatitis,mild intraand extrahepatic biliary ductal dilatation with cholelithiasis - MRCP 8/14 no choledocholithiasis, worsening pancreatitis with possible hemorrhagic component or necrotizing pancreatitis -Tolerating postpyloric NG tube feeds and low-fat diet at this time -Remains on IV meropenem day7 for necrotizing pancreatitis with fevers -IV fluids discontinued, Due to volume overloaded state -Gastroenterology following, recommends 3 week course of IV carbapenem - General surgery following, plan for interval laparoscopic cholecystectomy -increase ambulation  Diarrhea -Due to tube feeds, C. Difficile PCR is negative, continue when necessary Imodium  Volume overload with mild pulmonary edema:  -Improving with diuresis, IV fluids discontinued -transthoracic echocardiogram done at [facility] June 2019 showed a preserved EF.  He also had a negative stress test in June 2019 as well. -continue IV Lasix today  Acute kidney injury: Hemodynamically mediated in the setting of necrotizing pancreatitis  resolved after IV fluids.  Hypophosphatemia.  Replaced and stable.  Hypokalemia.  Replaced will monitor.    Hypertension: Since has been started on oral diet will start the patient on low-dose Coreg along with PRN hydralazine IV.   DVT Prophylaxis:Prophylactic Lovenox   Code Status:Full code Family Communication: no family at bedside Disposition Plan: remain inpatient  Antimicrobial agents: Anti-infectives (From admission, onward)   Start     Dose/Rate Route Frequency Ordered Stop   03/17/18 1600  meropenem (MERREM) 1 g in sodium chloride 0.9 % 100 mL IVPB     1 g 200 mL/hr over 30 Minutes Intravenous Every 8 hours 03/17/18 1507     03/17/18 1300  ampicillin-sulbactam (UNASYN) 1.5 g in sodium chloride 0.9 % 100 mL IVPB  Status:  Discontinued     1.5 g 200 mL/hr over 30 Minutes Intravenous Every 6 hours 03/17/18 1132 03/17/18 1440      Procedures:  None  CONSULTS:  GI CCS  Time spent:  25 minutes-Greater than 50% of this time was spent in counseling, explanation of diagnosis, planning of further management, and coordination of care.  MEDICATIONS: Scheduled Meds: . carvedilol  6.25 mg Oral BID WC  . enoxaparin (LOVENOX) injection  40 mg Subcutaneous Q24H  . famotidine  20 mg Oral BID  . feeding supplement  1 Container Oral TID BM  . furosemide  40 mg Intravenous BID  . insulin aspart  0-15 Units Subcutaneous TID WC  . insulin aspart  0-5 Units Subcutaneous QHS  . nicotine  21 mg Transdermal Daily  . polyethylene glycol  17 g Oral Daily  . potassium chloride  20 mEq Oral Daily   Continuous Infusions: . feeding supplement (VITAL AF 1.2 CAL) 1,500 mL (03/23/18 1941)  .  meropenem (MERREM) IV 1 g (03/24/18 0856)   PRN Meds:.acetaminophen **OR** acetaminophen, alum & mag hydroxide-simeth, bisacodyl, chlorproMAZINE (THORAZINE) injection, hydrALAZINE, HYDROmorphone (DILAUDID) injection, loperamide, ondansetron **OR** ondansetron (ZOFRAN) IV, oxyCODONE,  senna-docusate   PHYSICAL EXAM: Vital signs: Vitals:   03/24/18 0024 03/24/18 0414 03/24/18 0458 03/24/18 0838  BP: 135/68 139/69  (!) 155/70  Pulse: 89 84  88  Resp: 11 12  18   Temp: 98.3 F (36.8 C) 98.6 F (37 C)  98.7 F (37.1 C)  TempSrc: Oral Oral  Oral  SpO2: 94% 95%  93%  Weight:   77.4 kg   Height:       Filed Weights   03/21/18 0502 03/22/18 0415 03/24/18 0458  Weight: 86 kg 82.6 kg 77.4 kg   Body mass index is 28.41 kg/m.    Exam Gen: Awake, Alert, Oriented X 3 ill-appearing male, no distress HEENT: PERRLA, Neck supple, NG tube noted Lungs: decreased breath sounds at both bases CVS: RRR,No Gallops,Rubs or new Murmurs Abd: soft, mild epigastric tenderness no rigidity or rebound Extremities: No Cyanosis, Clubbing or edema Skin: no new rashes I have personally reviewed following labs and imaging studies  LABORATORY DATA: CBC: Recent Labs  Lab 03/20/18 0348 03/21/18 0241 03/22/18 0357 03/23/18 0355 03/24/18 0312  WBC 12.4* 11.2* 11.3* 11.9* 12.7*  HGB 15.1 14.1 14.2 14.8 13.4  HCT 44.6 42.4 43.5 45.1 41.0  MCV 92.3 93.8 94.6 93.2 93.0  PLT 179 152 180 221 252    Basic Metabolic Panel: Recent Labs  Lab 03/20/18 0348 03/21/18 0241 03/22/18 0357 03/23/18 0355 03/24/18 0312  NA 139 140 140 138 138  K 3.7 3.9 3.6 3.3* 4.2  CL 102 100 101 97* 97*  CO2 28 30 31  35* 35*  GLUCOSE 170* 178* 191* 222* 267*  BUN 17 16 16 13 17   CREATININE 0.71 0.73 0.57* 0.80 0.74  CALCIUM 7.7* 7.4* 7.5* 7.9* 8.0*  MG 2.3  --  2.2 2.3 2.3  PHOS 1.2*  --  2.1* 2.6 2.8    GFR: Estimated Creatinine Clearance: 86.1 mL/min (by C-G formula based on SCr of 0.74 mg/dL).  Liver Function Tests: Recent Labs  Lab 03/20/18 0348 03/21/18 0241 03/22/18 0357 03/23/18 0355 03/24/18 0312  AST 37 32 31 34 52*  ALT 37 33 30 35 44  ALKPHOS 45 47 55 67 63  BILITOT 1.3* 1.0 0.9 1.1 1.0  PROT 4.9* 4.6* 4.9* 5.6* 5.1*  ALBUMIN 2.4* 2.2* 2.4* 2.8* 2.5*   Recent Labs  Lab  03/18/18 0337 03/21/18 0241  LIPASE 934* 50   No results for input(s): AMMONIA in the last 168 hours.  Coagulation Profile: Recent Labs  Lab 03/23/18 0355  INR 1.03    Cardiac Enzymes: No results for input(s): CKTOTAL, CKMB, CKMBINDEX, TROPONINI in the last 168 hours.  BNP (last 3 results) No results for input(s): PROBNP in the last 8760 hours.  HbA1C: No results for input(s): HGBA1C in the last 72 hours.  CBG: Recent Labs  Lab 03/23/18 2109 03/24/18 0051 03/24/18 0418 03/24/18 0827 03/24/18 1139  GLUCAP 198* 267* 256* 289* 124*    Lipid Profile: No results for input(s): CHOL, HDL, LDLCALC, TRIG, CHOLHDL, LDLDIRECT in the last 72 hours.  Thyroid Function Tests: No results for input(s): TSH, T4TOTAL, FREET4, T3FREE, THYROIDAB in the last 72 hours.  Anemia Panel: No results for input(s): VITAMINB12, FOLATE, FERRITIN, TIBC, IRON, RETICCTPCT in the last 72 hours.  Urine analysis: No results found for: COLORURINE, APPEARANCEUR, LABSPEC, PHURINE, GLUCOSEU,  HGBUR, BILIRUBINUR, KETONESUR, PROTEINUR, UROBILINOGEN, NITRITE, LEUKOCYTESUR  Sepsis Labs: Lactic Acid, Venous No results found for: LATICACIDVEN  MICROBIOLOGY: Recent Results (from the past 240 hour(s))  Culture, blood (routine x 2)     Status: None (Preliminary result)   Collection Time: 03/19/18  2:43 PM  Result Value Ref Range Status   Specimen Description BLOOD LEFT ARM  Final   Special Requests   Final    BOTTLES DRAWN AEROBIC AND ANAEROBIC Blood Culture adequate volume   Culture   Final    NO GROWTH 4 DAYS Performed at [facility], [address] [place], [place] [address]    Report Status PENDING  Incomplete  Culture, blood (routine x 2)     Status: None (Preliminary result)   Collection Time: 03/19/18  2:51 PM  Result Value Ref Range Status   Specimen Description BLOOD RIGHT HAND  Final   Special Requests   Final    BOTTLES DRAWN AEROBIC AND ANAEROBIC Blood Culture adequate volume    Culture   Final    NO GROWTH 4 DAYS Performed at [facility], [address] [place], [place] [address]    Report Status PENDING  Incomplete  C difficile quick scan w PCR reflex     Status: None   Collection Time: 03/22/18  4:22 PM  Result Value Ref Range Status   C Diff antigen NEGATIVE NEGATIVE Final   C Diff toxin NEGATIVE NEGATIVE Final   C Diff interpretation No C. difficile detected.  Final    Comment: Performed at [facility], [address] [place], [place] [address]    RADIOLOGY STUDIES/RESULTS: Ct Abdomen Pelvis W Contrast  Result Date: 03/22/2018 CLINICAL DATA:  Inpatient.  Necrotizing gallstone pancreatitis. EXAM: CT ABDOMEN AND PELVIS WITH CONTRAST TECHNIQUE: Multidetector CT imaging of the abdomen and pelvis was performed using the standard protocol following bolus administration of intravenous contrast. CONTRAST:  ISOVUE-300 IOPAMIDOL (ISOVUE-300) INJECTION 61% COMPARISON:  04/02/2018 CT abdomen/pelvis.  03/17/2018 MRI abdomen. FINDINGS: Lower chest: Small dependent bilateral pleural effusions, mildly increased bilaterally. Dependent atelectasis at both lung bases. Hepatobiliary: Normal liver size. No liver mass. Cholelithiasis. No definite gallbladder wall thickening. No biliary ductal dilatation. CBD diameter 5 mm. Pancreas: There is absence of parenchymal enhancement throughout much of the pancreatic body and portions of the pancreatic tail, compatible with necrotizing pancreatitis, significantly worsened since 03/16/2018 CT and similar to slightly worsened since 03/17/2018 MRI. Enhancement is preserved in the pancreatic head. Diffuse peripancreatic fluid and fat stranding is similar. No discrete pancreatic mass. No pancreatic duct dilation. Spleen: Normal size. No mass. Adrenals/Urinary Tract: Normal adrenals. No hydronephrosis. Subcentimeter hypodense renal cortical lesions in the mid to lower right kidney are too small to characterize and unchanged. Normal  bladder. Stomach/Bowel: Enteric tube terminates in the distal stomach. Stomach appears unremarkable. Diffuse moderate small bowel wall thickening without significant small bowel dilatation, worsened from prior CT. Normal appendix. Normal large bowel with no diverticulosis, large bowel wall thickening or pericolonic fat stranding. Vascular/Lymphatic: Atherosclerotic nonaneurysmal abdominal aorta. Patent portal, splenic, hepatic and renal veins. Enlarged 1.9 cm porta hepatis node (series 3/image 34), increased from 1.4 cm. No additional pathologically enlarged lymph nodes in the abdomen or pelvis. Reproductive: Top-normal size prostate with nonspecific internal prostatic calcifications. Other: No pneumoperitoneum. Small to moderate volume ascites, predominantly perihepatic, increased. No focal measurable fluid collection. Mild anasarca, new. Musculoskeletal: No aggressive appearing focal osseous lesions. Moderate thoracolumbar spondylosis. IMPRESSION: 1. Necrotizing pancreatitis as detailed, similar  to slightly worsened since 03/17/2018 MRI, significantly worsened since 03/16/2018 CT. 2. Worsening third-spacing of fluid, with increased small to moderate volume ascites, increased small dependent bilateral pleural effusions, new anasarca and increased diffuse small bowel wall thickening. 3. Mild porta hepatis adenopathy is increased and probably reactive. 4. Cholelithiasis.  No biliary ductal dilatation. Electronically Signed   By: Delbert Phenix M.D.   On: 03/22/2018 09:06   Ct Abdomen Pelvis W Contrast  Result Date: 03/16/2018 CLINICAL DATA:  67 y/o  M; epigastric pain. EXAM: CT ABDOMEN AND PELVIS WITH CONTRAST TECHNIQUE: Multidetector CT imaging of the abdomen and pelvis was performed using the standard protocol following bolus administration of intravenous contrast. CONTRAST:  100 cc Omnipaque 300 COMPARISON:  01/25/2008 CT of the abdomen and pelvis. FINDINGS: Lower chest: No acute abnormality. Hepatobiliary: Mild  intra and extrahepatic biliary ductal dilatation with the common bile duct measuring up to 11 mm. No focal liver lesion. Cholelithiasis. Pancreas: Severe extensive edema surrounding the pancreas compatible with acute pancreatitis. No discrete acute peripancreatic collection. Spleen: Normal in size without focal abnormality. Adrenals/Urinary Tract: Adrenal glands are unremarkable. Kidneys are normal, without renal calculi, focal lesion, or hydronephrosis. Bladder is unremarkable. Stomach/Bowel: Stomach is within normal limits. Mild wall thickening of the duodenum, likely reactive inflammatory changes. Appendix appears normal. No evidence of bowel wall thickening, distention, or inflammatory changes of the jejunum, ileum, or colon. Vascular/Lymphatic: Severe mixed atherosclerosis of the aorta. Bilateral common iliac and internal iliac artery aneurysms. The right common iliac artery aneurysm measures 2 cm and left 1.7 cm. No lymphadenopathy. Reproductive: Prostate is unremarkable. Other: No abdominal wall hernia or abnormality. No abdominopelvic ascites. Musculoskeletal: No fracture is seen. Moderate degenerative changes of the lumbar spine. IMPRESSION: 1. Acute pancreatitis with extensive retroperitoneal edema. No discrete acute peripancreatic collection identified at this time. Mild intra and extrahepatic biliary ductal dilatation with cholelithiasis may represent choledocholithiasis and gallstone pancreatitis. This can be further characterized with MRI/MRCP of the abdomen. 2. Severe aortic atherosclerosis with bilateral common iliac artery and internal iliac artery aneurysms. Electronically Signed   By: Mitzi Hansen M.D.   On: 03/16/2018 05:49   Mr 3d Recon At Scanner  Result Date: 03/17/2018 CLINICAL DATA:  Acute pancreatitis, etiology unknown. Cholelithiasis, diabetes and hepatitis C. EXAM: MRI ABDOMEN WITHOUT AND WITH CONTRAST (INCLUDING MRCP) TECHNIQUE: Multiplanar multisequence MR imaging of the  abdomen was performed both before and after the administration of intravenous contrast. Heavily T2-weighted images of the biliary and pancreatic ducts were obtained, and three-dimensional MRCP images were rendered by post processing. CONTRAST:  15mL MULTIHANCE GADOBENATE DIMEGLUMINE 529 MG/ML IV SOLN COMPARISON:  Abdominal CT 03/16/2018 and 01/25/2008. FINDINGS: Lower chest: New small bilateral pleural effusions with associated subsegmental atelectasis at both lung bases. Hepatobiliary: The liver is normal in appearance without morphologic changes of cirrhosis, focal lesion or abnormal enhancement. Large gallstones are again noted. The common hepatic duct measures 7 mm in diameter. There is no evidence of choledocholithiasis. Pancreas: There is moderate diffuse enlargement of the pancreas with extensive surrounding inflammatory changes. Prior to contrast, there is heterogeneous increased T1 signal throughout the pancreatic body and tail, suspicious for hemorrhage. Following contrast, there is very poor enhancement of the pancreas, especially in the body and tail. Portions of the pancreatic head enhance normally. There is no pancreatic ductal dilatation. Normal pancreatic ductal anatomy. Spleen: Normal in size without focal abnormality. Adrenals/Urinary Tract: Both adrenal glands appear normal. Small bilateral renal cysts. No evidence of renal mass or hydronephrosis. Stomach/Bowel: No evidence  of bowel wall thickening or focal inflammation. Stable mild colonic dilatation, most consistent with an ileus. Vascular/Lymphatic: There are no enlarged abdominal lymph nodes. No significant vascular findings. The portal, superior mesenteric and splenic veins are patent. Other: There is a moderate amount of ascites. There is extensive inflammation and ill-defined fluid throughout the retroperitoneal and mesenteric fat. No well-defined fluid collections are demonstrated. Musculoskeletal: No acute or significant osseous findings.  IMPRESSION: 1. Worsening severe acute pancreatitis with suspicion of hemorrhagic components. There is a very poor enhancement of most of the pancreas, consistent with necrotizing pancreatitis and implying a poor prognosis. 2. Associated increased ascites, inflammation and ill-defined fluid throughout the intra-abdominal fat, small bilateral pleural effusions and bibasilar atelectasis. 3. Cholelithiasis without evidence of significant biliary dilatation or choledocholithiasis. Electronically Signed   By: William  Veazey M.D.   On: 03/17/2018 Carey Bullocks10:41   Dg Chest Port 1 View  Result Date: 03/17/2018 CLINICAL DATA:  Short of breath and abdominal pain.  Pancreatitis. EXAM: PORTABLE CHEST 1 VIEW COMPARISON:  01/20/2018 FINDINGS: Midline trachea. Normal heart size for level of inspiration. Aortic atherosclerosis. Probable layering small left pleural effusion. No pneumothorax. Low lung volumes with resultant pulmonary interstitial prominence. Left greater than right base airspace disease is new. IMPRESSION: Low lung volumes with probable small left pleural effusion and bibasilar airspace disease. Airspace disease is favored to represent atelectasis. Especially at the left lung base, infection or aspiration cannot be excluded Aortic Atherosclerosis (ICD10-I70.0). Electronically Signed   By: Jeronimo GreavesKyle  Talbot M.D.   On: 03/17/2018 17:26   Dg Abd Portable 1v  Result Date: 03/19/2018 CLINICAL DATA:  Feeding tube placement EXAM: PORTABLE ABDOMEN - 1 VIEW COMPARISON:  03/18/2018 FINDINGS: Feeding tube has been placed with the tip in the distal descending duodenum. Diffuse gaseous distention of bowel, likely ileus, similar to prior study. IMPRESSION: Feeding tube tip in the distal descending duodenum. Electronically Signed   By: Charlett NoseKevin  Dover M.D.   On: 03/19/2018 10:36   Dg Abd Portable 2v  Result Date: 03/18/2018 CLINICAL DATA:  History of hypertension. EXAM: PORTABLE ABDOMEN - 2 VIEW COMPARISON:  None. FINDINGS: Small  pleural effusions. Air-filled loops of large and small bowel are identified with air to the level the rectum. There is no definitive dilatation. No free air, portal venous gas, or pneumatosis. No other acute abnormalities identified. IMPRESSION: Mildly prominent air-filled loops of large and small bowel without obstruction. A mild ileus is not excluded. Tiny pleural effusions. No other acute abnormalities. Electronically Signed   By: Gerome Samavid  Williams III M.D   On: 03/18/2018 09:18   Mr Abdomen Mrcp W Wo Contast  Result Date: 03/17/2018 CLINICAL DATA:  Acute pancreatitis, etiology unknown. Cholelithiasis, diabetes and hepatitis C. EXAM: MRI ABDOMEN WITHOUT AND WITH CONTRAST (INCLUDING MRCP) TECHNIQUE: Multiplanar multisequence MR imaging of the abdomen was performed both before and after the administration of intravenous contrast. Heavily T2-weighted images of the biliary and pancreatic ducts were obtained, and three-dimensional MRCP images were rendered by post processing. CONTRAST:  15mL MULTIHANCE GADOBENATE DIMEGLUMINE 529 MG/ML IV SOLN COMPARISON:  Abdominal CT 03/16/2018 and 01/25/2008. FINDINGS: Lower chest: New small bilateral pleural effusions with associated subsegmental atelectasis at both lung bases. Hepatobiliary: The liver is normal in appearance without morphologic changes of cirrhosis, focal lesion or abnormal enhancement. Large gallstones are again noted. The common hepatic duct measures 7 mm in diameter. There is no evidence of choledocholithiasis. Pancreas: There is moderate diffuse enlargement of the pancreas with extensive surrounding inflammatory changes. Prior to contrast, there  is heterogeneous increased T1 signal throughout the pancreatic body and tail, suspicious for hemorrhage. Following contrast, there is very poor enhancement of the pancreas, especially in the body and tail. Portions of the pancreatic head enhance normally. There is no pancreatic ductal dilatation. Normal pancreatic  ductal anatomy. Spleen: Normal in size without focal abnormality. Adrenals/Urinary Tract: Both adrenal glands appear normal. Small bilateral renal cysts. No evidence of renal mass or hydronephrosis. Stomach/Bowel: No evidence of bowel wall thickening or focal inflammation. Stable mild colonic dilatation, most consistent with an ileus. Vascular/Lymphatic: There are no enlarged abdominal lymph nodes. No significant vascular findings. The portal, superior mesenteric and splenic veins are patent. Other: There is a moderate amount of ascites. There is extensive inflammation and ill-defined fluid throughout the retroperitoneal and mesenteric fat. No well-defined fluid collections are demonstrated. Musculoskeletal: No acute or significant osseous findings. IMPRESSION: 1. Worsening severe acute pancreatitis with suspicion of hemorrhagic components. There is a very poor enhancement of most of the pancreas, consistent with necrotizing pancreatitis and implying a poor prognosis. 2. Associated increased ascites, inflammation and ill-defined fluid throughout the intra-abdominal fat, small bilateral pleural effusions and bibasilar atelectasis. 3. Cholelithiasis without evidence of significant biliary dilatation or choledocholithiasis. Electronically Signed   By: Carey BullocksWilliam  Veazey M.D.   On: 03/17/2018 10:41     LOS: 8 days   Zannie CovePreetha Babara [place], MD  [facility]  If 7PM-7AM, please contact night-coverage  Please page via [email or web address]-click on MD name and type text message  03/24/2018, 12:10 PM

## 2018-03-24 NOTE — Progress Notes (Signed)
[facility] will provide [facility] services for pt and partner with [facility] in the [place] [facility] and other needed [facility] services as ordered at DC. I will add agency and contact info once confirmed [facility] agency arrangements.    [facility] Coordinator will provide in hopsital teaching prior to DC to support independence at home.   If patient discharges after hours, please call [phone].   Sedalia Mutaamela S Chandler 03/24/2018, 10:07 AM

## 2018-03-24 NOTE — Progress Notes (Signed)
Inpatient Diabetes Program Recommendations  AACE/ADA: New Consensus Statement on Inpatient Glycemic Control (2015)  Target Ranges:  Prepandial:   less than 140 mg/dL      Peak postprandial:   less than 180 mg/dL (1-2 hours)      Critically ill patients:  140 - 180 mg/dL   Lab Results  Component Value Date   GLUCAP 289 (H) 03/24/2018    Review of Glycemic ControlResults for Waynetta PeanRICHARDSON, Vincenzo DEAN (MRN [id number]) as of 03/24/2018 10:46  Ref. Range 03/23/2018 16:08 03/23/2018 21:09 03/24/2018 00:51 03/24/2018 04:18 03/24/2018 08:27  Glucose-Capillary Latest Ref Range: 70 - 99 mg/dL 409151 (H) 811198 (H) 914267 (H) 256 (H) 289 (H)   Diabetes history: Diet controlled DM Outpatient Diabetes medications:  None Current orders for Inpatient glycemic control:  Novolog moderate tid with meals and HS  Inpatient Diabetes Program Recommendations:    Note patient is on Nocturnal feeds from 1900-0700.  Please increase Novolog correction to q 4 hours.  May also need low dose basal.  Consider Lantus 10 units daily.   Thanks,  Beryl MeagerJenny Ellyn Rubiano, RN, BC-ADM Inpatient Diabetes Coordinator Pager [phone] (8a-5p)

## 2018-03-24 NOTE — Plan of Care (Addendum)
Spoke to  patient and spouse about diet plan and increase ambulation. Patient with poor appetite. GI PA and Dietitian talked to patient and spouse about dietary goals. Patient has refused boost breeze supplement during the day. Supplement has been changed to Glucerna and patient  likes the chocolate.

## 2018-03-24 NOTE — Progress Notes (Signed)
[facility] Progress Note  7 Days Post-Op   Assessment/Plan HTN HLD Tobacco abuse - smokes 2 PPD DM - per patient well controlled with diet, not on any medications Hepatitis C - never received treatment Severe aortic atherosclerosis Gallstone pancreatitis - no prior h/o abdominal surgery - CT scan 8/13 acute pancreatitis, mild intraand extrahepatic biliary ductal dilatation with cholelithiasis - MRCP 8/14 no choledocholithiasis, worsening pancreatitis with possible hemorrhagic component or necrotizing pancreatitis  ID -unasyn 8/14>> VTE -SCDs, lovenox FEN - heart healthy Foley -none  DISPO: resolving slowly, will likely wait to do lap chole as output to wait for pancreatitis to improve    LOS: 8 days    Subjective: CC: abdominal pain  Pain overall improved. Tolerating a diet. Discussed maintaining a low fat diet until we are able to remove gallbladder. Pt expressed understanding. No nausea, vomiting, fever or chills overnight. No family at bedside.   Objective: Vital signs in last 24 hours: Temp:  [98 F (36.7 C)-98.6 F (37 C)] 98.6 F (37 C) (08/21 0414) Pulse Rate:  [84-97] 84 (08/21 0414) Resp:  [11-21] 12 (08/21 0414) BP: (123-159)/(64-77) 139/69 (08/21 0414) SpO2:  [92 %-96 %] 95 % (08/21 0414) Weight:  [77.4 kg] 77.4 kg (08/21 0458) Last BM Date: 03/23/18  Intake/Output from previous day: 08/20 0701 - 08/21 0700 In: 3720.5 [P.O.:480; I.V.:204.4; NG/GT:2415.8; IV Piggyback:620.3] Out: 2560 [Urine:2560] Intake/Output this shift: No intake/output data recorded.  PE: Gen: Alert, NAD, pleasant, cooperative Pulm:Rate andeffort normal Abd: Soft,distended, +BS,no TTP, no guarding, no peritonitis Skin: no rashes noted, warm and dry  Anti-infectives: Anti-infectives (From admission, onward)   Start     Dose/Rate Route Frequency Ordered Stop   03/17/18 1600  meropenem (MERREM) 1 g in sodium chloride 0.9 % 100 mL IVPB     1  g 200 mL/hr over 30 Minutes Intravenous Every 8 hours 03/17/18 1507     03/17/18 1300  ampicillin-sulbactam (UNASYN) 1.5 g in sodium chloride 0.9 % 100 mL IVPB  Status:  Discontinued     1.5 g 200 mL/hr over 30 Minutes Intravenous Every 6 hours 03/17/18 1132 03/17/18 1440      Lab Results:  Recent Labs    03/23/18 0355 03/24/18 0312  WBC 11.9* 12.7*  HGB 14.8 13.4  HCT 45.1 41.0  PLT 221 252   BMET Recent Labs    03/23/18 0355 03/24/18 0312  NA 138 138  K 3.3* 4.2  CL 97* 97*  CO2 35* 35*  GLUCOSE 222* 267*  BUN 13 17  CREATININE 0.80 0.74  CALCIUM 7.9* 8.0*   PT/INR Recent Labs    03/23/18 0355  LABPROT 13.4  INR 1.03   CMP     Component Value Date/Time   NA 138 03/24/2018 0312   K 4.2 03/24/2018 0312   CL 97 (L) 03/24/2018 0312   CO2 35 (H) 03/24/2018 0312   GLUCOSE 267 (H) 03/24/2018 0312   BUN 17 03/24/2018 0312   CREATININE 0.74 03/24/2018 0312   CALCIUM 8.0 (L) 03/24/2018 0312   PROT 5.1 (L) 03/24/2018 0312   ALBUMIN 2.5 (L) 03/24/2018 0312   AST 52 (H) 03/24/2018 0312   ALT 44 03/24/2018 0312   ALKPHOS 63 03/24/2018 0312   BILITOT 1.0 03/24/2018 0312   GFRNONAA >60 03/24/2018 0312   GFRAA >60 03/24/2018 0312   Lipase     Component Value Date/Time   LIPASE 50 03/21/2018 0241    Studies/Results: Ct Abdomen Pelvis W Contrast  Result Date:  03/22/2018 CLINICAL DATA:  Inpatient.  Necrotizing gallstone pancreatitis. EXAM: CT ABDOMEN AND PELVIS WITH CONTRAST TECHNIQUE: Multidetector CT imaging of the abdomen and pelvis was performed using the standard protocol following bolus administration of intravenous contrast. CONTRAST:  100mL ISOVUE-300 IOPAMIDOL (ISOVUE-300) INJECTION 61% COMPARISON:  04/02/2018 CT abdomen/pelvis.  03/17/2018 MRI abdomen. FINDINGS: Lower chest: Small dependent bilateral pleural effusions, mildly increased bilaterally. Dependent atelectasis at both lung bases. Hepatobiliary: Normal liver size. No liver mass. Cholelithiasis. No  definite gallbladder wall thickening. No biliary ductal dilatation. CBD diameter 5 mm. Pancreas: There is absence of parenchymal enhancement throughout much of the pancreatic body and portions of the pancreatic tail, compatible with necrotizing pancreatitis, significantly worsened since 03/16/2018 CT and similar to slightly worsened since 03/17/2018 MRI. Enhancement is preserved in the pancreatic head. Diffuse peripancreatic fluid and fat stranding is similar. No discrete pancreatic mass. No pancreatic duct dilation. Spleen: Normal size. No mass. Adrenals/Urinary Tract: Normal adrenals. No hydronephrosis. Subcentimeter hypodense renal cortical lesions in the mid to lower right kidney are too small to characterize and unchanged. Normal bladder. Stomach/Bowel: Enteric tube terminates in the distal stomach. Stomach appears unremarkable. Diffuse moderate small bowel wall thickening without significant small bowel dilatation, worsened from prior CT. Normal appendix. Normal large bowel with no diverticulosis, large bowel wall thickening or pericolonic fat stranding. Vascular/Lymphatic: Atherosclerotic nonaneurysmal abdominal aorta. Patent portal, splenic, hepatic and renal veins. Enlarged 1.9 cm porta hepatis node (series 3/image 34), increased from 1.4 cm. No additional pathologically enlarged lymph nodes in the abdomen or pelvis. Reproductive: Top-normal size prostate with nonspecific internal prostatic calcifications. Other: No pneumoperitoneum. Small to moderate volume ascites, predominantly perihepatic, increased. No focal measurable fluid collection. Mild anasarca, new. Musculoskeletal: No aggressive appearing focal osseous lesions. Moderate thoracolumbar spondylosis. IMPRESSION: 1. Necrotizing pancreatitis as detailed, similar to slightly worsened since 03/17/2018 MRI, significantly worsened since 03/16/2018 CT. 2. Worsening third-spacing of fluid, with increased small to moderate volume ascites, increased small  dependent bilateral pleural effusions, new anasarca and increased diffuse small bowel wall thickening. 3. Mild porta hepatis adenopathy is increased and probably reactive. 4. Cholelithiasis.  No biliary ductal dilatation. Electronically Signed   By: Delbert PhenixJason A Poff M.D.   On: 03/22/2018 09:06      Jerre SimonJessica L Sajan [place] , [facility] 03/24/2018, 8:26 AM  Pager: [phone] Mon-Wed, Friday 7:00am-4:30pm Thurs 7am-11:30am  Consults: [phone]

## 2018-03-24 NOTE — Care Management Note (Signed)
Case Management Note  Patient Details  Name: Ryan Chan MRN: [id number] Date of Birth: [birth year]  Subjective/Objective:             Gallstone pancreatitis       Action/Plan:  SPoke w patient at bedside. He is from home w wife. Independent PTA and walking well inpatient. Will need 3 weeks IV abx, per ID notes. Jeri ModenaPam Chandler w [facility] infusions notified and following case. PICC not in place at this time. Discussed home needs.  [facility]  NEEDS orders for IV meds and HH. [facility].   Expected Discharge Date:                  Expected Discharge Plan:  Home w Home Health Services  In-House Referral:     Discharge planning Services  CM Consult  Post Acute Care Choice:  Home Health, Durable Medical Equipment Choice offered to:  Patient  DME Arranged:    DME Agency:  [facility]  HH [facility]    [facility]  Status of Service:  In process, will continue to follow  If discussed at Long Length of Stay Meetings, dates discussed:    Additional Comments:  Lawerance SabalDebbie Meilin Brosh, RN 03/24/2018, 11:25 AM

## 2018-03-24 NOTE — Progress Notes (Signed)
Notified Jeri ModenaPam Chandler w [facility] Infusions to follow for home IV needs.

## 2018-03-24 NOTE — Progress Notes (Addendum)
Physical Therapy Treatment Patient Details Name: Ryan PeanCharles Dean Lux MRN: [id number] DOB: [birth year] Today's Date: 03/24/2018    History of Present Illness Patient is a 67 y.o. male with a history of hypertension, dyslipidemia presented with acute gallstone pancreatitis, MRI on 8/14 suggestive of necrotizing pancreatitis.  Improving with supportive care, hospital course complicated by volume overload with mild acute pulmonary edema and fever.  Started on empiric meropenem, postpyloric NG tube feedings, with plans to repeat imaging in the next few days.    PT Comments    Pt admitted with above diagnosis. Pt currently with functional limitations due to balance and endurance deficits. Pt was able to ambulate without device with one LOB with challenge which pt self corrected.  Will continue to follow pt.  Pt will benefit from skilled PT to increase their independence and safety with mobility to allow discharge to the venue listed below.     Follow Up Recommendations  No PT follow up;Supervision - Intermittent     Equipment Recommendations  Other (comment)(TBA)    Recommendations for Other Services       Precautions / Restrictions Precautions Precautions: None Restrictions Weight Bearing Restrictions: No    Mobility  Bed Mobility Overal bed mobility: Independent                Transfers Overall transfer level: Independent                  Ambulation/Gait Ambulation/Gait assistance: Min guard Gait Distance (Feet): 450 Feet Assistive device: None Gait Pattern/deviations: Step-through pattern;Decreased stride length   Gait velocity interpretation: 1.31 - 2.62 ft/sec, indicative of limited community ambulator General Gait Details: Pt ambulated well overall with good cadence.  One  LOB with challenge.  Slow  and guarded gait.    Stairs             Wheelchair Mobility    Modified Rankin (Stroke Patients Only)       Balance Overall balance  assessment: Needs assistance Sitting-balance support: No upper extremity supported;Feet supported Sitting balance-Leahy Scale: Normal     Standing balance support: No upper extremity supported;During functional activity Standing balance-Leahy Scale: Fair Standing balance comment: can stand statically without UE or external support.  Cannot withstand more than min challenge to balance             High level balance activites: Direction changes;Turns;Sudden stops;Head turns High Level Balance Comments: one LOB which pt self corrected wtih head turns.             Cognition Arousal/Alertness: Awake/alert Behavior During Therapy: WFL for tasks assessed/performed Overall Cognitive Status: Within Functional Limits for tasks assessed                                        Exercises General Exercises - Lower Extremity Ankle Circles/Pumps: AROM;Both;10 reps;Seated    General Comments        Pertinent Vitals/Pain Pain Assessment: No/denies pain    Home Living                      Prior Function            PT Goals (current goals can now be found in the care plan section) Acute Rehab PT Goals Patient Stated Goal: to go home Progress towards PT goals: Progressing toward goals    Frequency    Min 3X/week  PT Plan Current plan remains appropriate    Co-evaluation              AM-PAC PT "6 Clicks" Daily Activity  Outcome Measure  Difficulty turning over in bed (including adjusting bedclothes, sheets and blankets)?: None Difficulty moving from lying on back to sitting on the side of the bed? : None Difficulty sitting down on and standing up from a chair with arms (e.g., wheelchair, bedside commode, etc,.)?: None Help needed moving to and from a bed to chair (including a wheelchair)?: None Help needed walking in hospital room?: A Little Help needed climbing 3-5 steps with a railing? : A Little 6 Click Score: 22    End of  Session Equipment Utilized During Treatment: Gait belt Activity Tolerance: Patient tolerated treatment well Patient left: in bed;with call bell/phone within reach Nurse Communication: Mobility status PT Visit Diagnosis: Unsteadiness on feet (R26.81);Muscle weakness (generalized) (M62.81)     Time: [id number] PT Time Calculation (min) (ACUTE ONLY): 12 min  Charges:  $Gait Training: 8-22 mins                     [facility] Carlis Blanchard,PT Acute Rehabilitation [phone] (pager)    Berline Lopesawn F Brenn Gatton 03/24/2018, 10:26 AM

## 2018-03-24 NOTE — Progress Notes (Signed)
          Daily Rounding Note  03/24/2018, 1:49 PM  LOS: 8 days   SUBJECTIVE:   Minor abdominal pain.  Oxycodone works well for this.  No n/v.   MD switched TF to nocturnal, at 80 ml/hour.   Appetite this AM a little better but staff recorded he ate 25% of todays meals.   Loose stools.       OBJECTIVE:         Vital signs in last 24 hours:    Temp:  [98.3 F (36.8 C)-98.7 F (37.1 C)] 98.3 F (36.8 C) (08/21 1325) Pulse Rate:  [81-97] 81 (08/21 1325) Resp:  [11-18] 18 (08/21 1325) BP: (123-157)/(64-70) 143/68 (08/21 1325) SpO2:  [92 %-97 %] 97 % (08/21 1325) Weight:  [77.4 kg] 77.4 kg (08/21 0458) Last BM Date: 03/23/18 Filed Weights   03/21/18 0502 03/22/18 0415 03/24/18 0458  Weight: 86 kg 82.6 kg 77.4 kg   General: looks moderately ill.  Comfortable, sitting up in bed   Heart: RRR Chest: clear bil.  No SOB or cough Abdomen: soft, ND, minor mid/lowe abd tenderness.    Extremities: no CCE Neuro/Psych:  Oriented x 3, calm, fully alert.    Intake/Output from previous day: 08/20 0701 - 08/21 0700 In: 3720.5 [P.O.:480; I.V.:204.4; NG/GT:2415.8; IV Piggyback:620.3] Out: 2560 [Urine:2560]  Intake/Output this shift: Total I/O In: 360 [P.O.:360] Out: 1000 [Urine:1000]  Lab Results: Recent Labs    03/22/18 0357 03/23/18 0355 03/24/18 0312  WBC 11.3* 11.9* 12.7*  HGB 14.2 14.8 13.4  HCT 43.5 45.1 41.0  PLT 180 221 252   BMET Recent Labs    03/22/18 0357 03/23/18 0355 03/24/18 0312  NA 140 138 138  K 3.6 3.3* 4.2  CL 101 97* 97*  CO2 31 35* 35*  GLUCOSE 191* 222* 267*  BUN 16 13 17   CREATININE 0.57* 0.80 0.74  CALCIUM 7.5* 7.9* 8.0*   LFT Recent Labs    03/22/18 0357 03/23/18 0355 03/24/18 0312  PROT 4.9* 5.6* 5.1*  ALBUMIN 2.4* 2.8* 2.5*  AST 31 34 52*  ALT 30 35 44  ALKPHOS 55 67 63  BILITOT 0.9 1.1 1.0   PT/INR Recent Labs    03/23/18 0355  LABPROT 13.4  INR 1.03   Hepatitis  Panel No results for input(s): HEPBSAG, HCVAB, HEPAIGM, HEPBIGM in the last 72 hours.  Studies/Results: No results found.  ASSESMENT:   *   Severe acute gallstone pancreatitis with necrosis.   Mild leukocytosis persists.  No fevers. Day 7/21 Meropenem.   On tube feeds, rate now at 40 ml/hr,  but now tolerating solid, low fat diet   *  Loose stools,  C diff negative.    *   Hypokalemia.    *   DM.     PLAN   *  Continue nocturnal TF, ? Do we reduce rate to see if this will stimulate pts appetite? If unable to meet nutritional goals, will need to discharge home on cortrak tube feeds.    *  Care mgt aware of need [facility] but will need order from MD for this.  Jennye Moccasin.      Ryan Chan  03/24/2018, 1:49 PM Phone [phone]

## 2018-03-25 ENCOUNTER — Inpatient Hospital Stay: Payer: Self-pay

## 2018-03-25 LAB — COMPREHENSIVE METABOLIC PANEL
ALK PHOS: 69 U/L (ref 38–126)
ALT: 52 U/L — ABNORMAL HIGH (ref 0–44)
AST: 45 U/L — ABNORMAL HIGH (ref 15–41)
Albumin: 2.6 g/dL — ABNORMAL LOW (ref 3.5–5.0)
Anion gap: 9 (ref 5–15)
BUN: 17 mg/dL (ref 8–23)
CALCIUM: 8.1 mg/dL — AB (ref 8.9–10.3)
CO2: 33 mmol/L — AB (ref 22–32)
CREATININE: 0.66 mg/dL (ref 0.61–1.24)
Chloride: 94 mmol/L — ABNORMAL LOW (ref 98–111)
GFR calc Af Amer: >60 mL/min (ref >60)
GFR calc non Af Amer: >60 mL/min (ref >60)
Glucose, Bld: 294 mg/dL — ABNORMAL HIGH (ref 70–99)
Potassium: 3.7 mmol/L (ref 3.5–5.1)
SODIUM: 136 mmol/L (ref 135–145)
Total Bilirubin: 0.8 mg/dL (ref 0.3–1.2)
Total Protein: 5.2 g/dL — ABNORMAL LOW (ref 6.5–8.1)

## 2018-03-25 LAB — CBC
HCT: 40.6 % (ref 39.0–52.0)
Hemoglobin: 13.5 g/dL (ref 13.0–17.0)
MCH: 30.7 pg (ref 26.0–34.0)
MCHC: 33.3 g/dL (ref 30.0–36.0)
MCV: 92.3 fL (ref 78.0–100.0)
PLATELETS: 262 10*3/uL (ref 150–400)
RBC: 4.4 MIL/uL (ref 4.22–5.81)
RDW: 12.9 % (ref 11.5–15.5)
WBC: 13 10*3/uL — ABNORMAL HIGH (ref 4.0–10.5)

## 2018-03-25 LAB — GLUCOSE, CAPILLARY
GLUCOSE-CAPILLARY: 196 mg/dL — AB (ref 70–99)
GLUCOSE-CAPILLARY: 279 mg/dL — AB (ref 70–99)
Glucose-Capillary: 257 mg/dL — ABNORMAL HIGH (ref 70–99)
Glucose-Capillary: 319 mg/dL — ABNORMAL HIGH (ref 70–99)
Glucose-Capillary: 390 mg/dL — ABNORMAL HIGH (ref 70–99)
Glucose-Capillary: 212 mg/dL — ABNORMAL HIGH (ref 70–99)

## 2018-03-25 LAB — MAGNESIUM: Magnesium: 2.1 mg/dL (ref 1.7–2.4)

## 2018-03-25 MED ORDER — FUROSEMIDE 10 MG/ML IJ SOLN
20.0000 mg | Freq: Two times a day (BID) | INTRAMUSCULAR | Status: DC
  Administered 2018-03-25: 20 mg via INTRAVENOUS
  Filled 2018-03-25: qty 2

## 2018-03-25 MED ORDER — SODIUM CHLORIDE 0.9% FLUSH: 10.0000 mL | INTRAVENOUS | Status: DC | PRN

## 2018-03-25 MED ORDER — INSULIN GLARGINE 100 UNIT/ML ~~LOC~~ SOLN
10.0000 [IU] | Freq: Every day | SUBCUTANEOUS | Status: DC
  Administered 2018-03-25 – 2018-03-26 (×2): 10 [IU] via SUBCUTANEOUS
  Filled 2018-03-25 (×2): qty 0.1

## 2018-03-25 MED ORDER — SODIUM CHLORIDE 0.9% FLUSH
10.0000 mL | Freq: Two times a day (BID) | INTRAVENOUS | Status: DC
  Administered 2018-03-25: 10 mL

## 2018-03-25 NOTE — Progress Notes (Signed)
Inpatient Diabetes Program Recommendations  AACE/ADA: New Consensus Statement on Inpatient Glycemic Control (2015)  Target Ranges:  Prepandial:   less than 140 mg/dL      Peak postprandial:   less than 180 mg/dL (1-2 hours)      Critically ill patients:  140 - 180 mg/dL   Lab Results  Component Value Date   GLUCAP 319 (H) 03/25/2018    Review of Glycemic Control Results for Ryan PeanRICHARDSON, Jerol DEAN (MRN [id number]) as of 03/25/2018 12:17  Ref. Range 03/24/2018 16:29 03/24/2018 20:26 03/24/2018 23:33 03/25/2018 08:58 03/25/2018 09:05  Glucose-Capillary Latest Ref Range: 70 - 99 mg/dL 409149 (H) 811266 (H) 914257 (H) 390 (H) 319 (H)   Diabetes history:Diet controlled DM Outpatient Diabetes medications: None Current orders for Inpatient glycemic control: Novolog moderate tid with meals and HS  Inpatient Diabetes Program Recommendations:    Note patient is on Nocturnal feeds from 1900-0700. - Increase Novolog correction to q 4 hours - Lantus 10 units daily  Thank you, Darel HongJudy E. Ophie Burrowes, RN, MSN, CDE  Diabetes Coordinator Inpatient Glycemic Control Team Team Pager [phone] (8am-5pm) 03/25/2018 12:18 PM

## 2018-03-25 NOTE — Progress Notes (Signed)
Spoke with Alfredia ClientMarilynn with IV team. Advised not a priority for PICC placement. Will place when team becomes available. Lacy DuverneyJennifer Devine Dant, RN

## 2018-03-25 NOTE — Progress Notes (Addendum)
Nutrition Brief Note  RD spoke with patient and wife. Pt's appetite is good. He consumed 75% of his dinner last night, 100% of breakfast this am and 75% of his lunch this afternoon. Pt is drinking all his Glucerna Shake supplements. He continues with some mild pain but feels it does not hinder him from eating well. Pt is meeting >/= 90% of his estimated nutrition needs. Recommend D/C Cortrak feeding tube at this time. Discussed with Dr. Jomarie LongsJoseph and Verbal with Read Back order received. Notified Victorino DikeJennifer, Charity fundraiserN.  Maureen ChattersKatie Aksel Bencomo, RD, LDN Pager #: [phone] After-Hours Pager #: [phone]

## 2018-03-25 NOTE — Progress Notes (Signed)
PROGRESS NOTE        PATIENT DETAILS Name: Ryan Chan Age: 67 y.o. Sex: male Date of Birth: 08-19-1950 Admit Date: 03/16/2018 Admitting Physician Jonah Blue, MD ZOX:[id number], Pcp Not In  Brief Narrative: Patient is a 67 y.o. male with a history of hypertension, dyslipidemia presented with acute gallstone pancreatitis, MRI on 8/14 suggestive of necrotizing pancreatitis.  Improving with supportive care, hospital course complicated by volume overload with mild acute pulmonary edema and fever.  Started on empiric meropenem, postpyloric NG tube feedings,  General surgery and GI following.  Subjective: feeling better overall, breathing improving, less diarrhea, denies any abdominal pain   Assessment/Plan:  Acute necrotizing gallstone pancreatitis: -continued clinical improvement -CT scan 8/13 acute pancreatitis,mild intraand extrahepatic biliary ductal dilatation with cholelithiasis -MRCP 8/14 no choledocholithiasis, worsening pancreatitis with possible hemorrhagic component or necrotizing pancreatitis -tolerating a low-fat diet and on reduced tube feeds at night -Remains on IV meropenem day 8 for necrotizing pancreatitis with fevers, have ordered PICC line for additional 2 weeks of antibiotics per gastroenterology recommendations - General surgery following, plan for interval laparoscopic cholecystectomy -increase ambulation  Diarrhea -Due to tube feeds, C. Difficile PCR is negative, continue when necessary Imodium  Volume overload with mild pulmonary edema:  -Improving with diuresis, IV fluids discontinued -transthoracic echocardiogram done at [facility] June 2019 showed a preserved EF.  He also had a negative stress test in June 2019 as well. -continue IV Lasix, intake continues to be high, volume status improving, cut down Lasix to 20 mg every 12  Acute kidney injury: Hemodynamically mediated in the setting of necrotizing pancreatitis  resolved  Hypophosphatemia.  Replaced and stable.  Hypokalemia.  Replaced will monitor.    Hypertension: -stable, continue Coreg.   DVT Prophylaxis:Prophylactic Lovenox   Code Status:Full code Family Communication: no family at bedside Disposition Plan: remain inpatient, home in 1-2days  Antimicrobial agents: Anti-infectives (From admission, onward)   Start     Dose/Rate Route Frequency Ordered Stop   03/17/18 1600  meropenem (MERREM) 1 g in sodium chloride 0.9 % 100 mL IVPB     1 g 200 mL/hr over 30 Minutes Intravenous Every 8 hours 03/17/18 1507     03/17/18 1300  ampicillin-sulbactam (UNASYN) 1.5 g in sodium chloride 0.9 % 100 mL IVPB  Status:  Discontinued     1.5 g 200 mL/hr over 30 Minutes Intravenous Every 6 hours 03/17/18 1132 03/17/18 1440      Procedures:  None  CONSULTS:  GI CCS  Time spent:  25 minutes-Greater than 50% of this time was spent in counseling, explanation of diagnosis, planning of further management, and coordination of care.  MEDICATIONS: Scheduled Meds: . carvedilol  6.25 mg Oral BID WC  . enoxaparin (LOVENOX) injection  40 mg Subcutaneous Q24H  . famotidine  20 mg Oral BID  . feeding supplement (GLUCERNA SHAKE)  237 mL Oral TID BM  . furosemide  40 mg Intravenous BID  . insulin aspart  0-15 Units Subcutaneous TID WC  . insulin aspart  0-5 Units Subcutaneous QHS  . insulin glargine  10 Units Subcutaneous Daily  . nicotine  21 mg Transdermal Daily  . polyethylene glycol  17 g Oral Daily  . potassium chloride  20 mEq Oral Daily   Continuous Infusions: . feeding supplement (VITAL AF 1.2 CAL) Stopped (03/25/18 0530)  . meropenem (MERREM) IV  1 g (03/25/18 1016)   PRN Meds:.acetaminophen **OR** acetaminophen, alum & mag hydroxide-simeth, bisacodyl, chlorproMAZINE (THORAZINE) injection, hydrALAZINE, hydrocortisone cream, HYDROmorphone (DILAUDID) injection, loperamide, ondansetron **OR** ondansetron (ZOFRAN) IV, oxyCODONE,  senna-docusate   PHYSICAL EXAM: Vital signs: Vitals:   03/24/18 2338 03/25/18 0355 03/25/18 0438 03/25/18 0849  BP: 127/64 124/70  (!) 144/63  Pulse:  84  81  Resp:  10    Temp: 98.8 F (37.1 C) 99.2 F (37.3 C)    TempSrc: Oral Oral    SpO2:  95%    Weight:   75.3 kg   Height:       Filed Weights   03/22/18 0415 03/24/18 0458 03/25/18 0438  Weight: 82.6 kg 77.4 kg 75.3 kg   Body mass index is 27.61 kg/m.    Exam Gen: Awake, Alert, Oriented X 3, looks much better, no distress HEENT: PERRLA, Neck supple, no JVD Lungs: decreased breath sounds at both bases CVS: RRR,No Gallops,Rubs or new Murmurs Abd: soft, minimal epigastric tenderness and fullness, non distended, BS present Extremities: No Cyanosis, Clubbing or edema Skin: no new rashes I have personally reviewed following labs and imaging studies  LABORATORY DATA: CBC: Recent Labs  Lab 03/21/18 0241 03/22/18 0357 03/23/18 0355 03/24/18 0312 03/25/18 0301  WBC 11.2* 11.3* 11.9* 12.7* 13.0*  HGB 14.1 14.2 14.8 13.4 13.5  HCT 42.4 43.5 45.1 41.0 40.6  MCV 93.8 94.6 93.2 93.0 92.3  PLT 152 180 221 252 262    Basic Metabolic Panel: Recent Labs  Lab 03/20/18 0348 03/21/18 0241 03/22/18 0357 03/23/18 0355 03/24/18 0312 03/25/18 0301  NA 139 140 140 138 138 136  K 3.7 3.9 3.6 3.3* 4.2 3.7  CL 102 100 101 97* 97* 94*  CO2 28 30 31  35* 35* 33*  GLUCOSE 170* 178* 191* 222* 267* 294*  BUN 17 16 16 13 17 17   CREATININE 0.71 0.73 0.57* 0.80 0.74 0.66  CALCIUM 7.7* 7.4* 7.5* 7.9* 8.0* 8.1*  MG 2.3  --  2.2 2.3 2.3 2.1  PHOS 1.2*  --  2.1* 2.6 2.8  --     GFR: Estimated Creatinine Clearance: 84.9 mL/min (by C-G formula based on SCr of 0.66 mg/dL).  Liver Function Tests: Recent Labs  Lab 03/21/18 0241 03/22/18 0357 03/23/18 0355 03/24/18 0312 03/25/18 0301  AST 32 31 34 52* 45*  ALT 33 30 35 44 52*  ALKPHOS 47 55 67 63 69  BILITOT 1.0 0.9 1.1 1.0 0.8  PROT 4.6* 4.9* 5.6* 5.1* 5.2*  ALBUMIN 2.2*  2.4* 2.8* 2.5* 2.6*   Recent Labs  Lab 03/21/18 0241  LIPASE 50   No results for input(s): AMMONIA in the last 168 hours.  Coagulation Profile: Recent Labs  Lab 03/23/18 0355  INR 1.03    Cardiac Enzymes: No results for input(s): CKTOTAL, CKMB, CKMBINDEX, TROPONINI in the last 168 hours.  BNP (last 3 results) No results for input(s): PROBNP in the last 8760 hours.  HbA1C: No results for input(s): HGBA1C in the last 72 hours.  CBG: Recent Labs  Lab 03/24/18 2026 03/24/18 2333 03/25/18 0858 03/25/18 0905 03/25/18 1224  GLUCAP 266* 257* 390* 319* 196*    Lipid Profile: No results for input(s): CHOL, HDL, LDLCALC, TRIG, CHOLHDL, LDLDIRECT in the last 72 hours.  Thyroid Function Tests: No results for input(s): TSH, T4TOTAL, FREET4, T3FREE, THYROIDAB in the last 72 hours.  Anemia Panel: No results for input(s): VITAMINB12, FOLATE, FERRITIN, TIBC, IRON, RETICCTPCT in the last 72 hours.  Urine analysis:  No results found for: COLORURINE, APPEARANCEUR, LABSPEC, PHURINE, GLUCOSEU, HGBUR, BILIRUBINUR, KETONESUR, PROTEINUR, UROBILINOGEN, NITRITE, LEUKOCYTESUR  Sepsis Labs: Lactic Acid, Venous No results found for: LATICACIDVEN  MICROBIOLOGY: Recent Results (from the past 240 hour(s))  Culture, blood (routine x 2)     Status: None   Collection Time: 03/19/18  2:43 PM  Result Value Ref Range Status   Specimen Description BLOOD LEFT ARM  Final   Special Requests   Final    BOTTLES DRAWN AEROBIC AND ANAEROBIC Blood Culture adequate volume   Culture   Final    NO GROWTH 5 DAYS Performed at [facility], [address], [place], [place] [address]    Report Status 03/24/2018 FINAL  Final  Culture, blood (routine x 2)     Status: None   Collection Time: 03/19/18  2:51 PM  Result Value Ref Range Status   Specimen Description BLOOD RIGHT HAND  Final   Special Requests   Final    BOTTLES DRAWN AEROBIC AND ANAEROBIC Blood Culture adequate volume   Culture   Final     NO GROWTH 5 DAYS Performed at [facility] [place] [facility], [address], [place], [place] [address]    Report Status 03/24/2018 FINAL  Final  C difficile quick scan w PCR reflex     Status: None   Collection Time: 03/22/18  4:22 PM  Result Value Ref Range Status   C Diff antigen NEGATIVE NEGATIVE Final   C Diff toxin NEGATIVE NEGATIVE Final   C Diff interpretation No C. difficile detected.  Final    Comment: Performed at [facility], [address] [place], [place] [address]    RADIOLOGY STUDIES/RESULTS: Ct Abdomen Pelvis W Contrast  Result Date: 03/22/2018 CLINICAL DATA:  Inpatient.  Necrotizing gallstone pancreatitis. EXAM: CT ABDOMEN AND PELVIS WITH CONTRAST TECHNIQUE: Multidetector CT imaging of the abdomen and pelvis was performed using the standard protocol following bolus administration of intravenous contrast. CONTRAST:  ISOVUE-300 IOPAMIDOL (ISOVUE-300) INJECTION 61% COMPARISON:  04/02/2018 CT abdomen/pelvis.  03/17/2018 MRI abdomen. FINDINGS: Lower chest: Small dependent bilateral pleural effusions, mildly increased bilaterally. Dependent atelectasis at both lung bases. Hepatobiliary: Normal liver size. No liver mass. Cholelithiasis. No definite gallbladder wall thickening. No biliary ductal dilatation. CBD diameter 5 mm. Pancreas: There is absence of parenchymal enhancement throughout much of the pancreatic body and portions of the pancreatic tail, compatible with necrotizing pancreatitis, significantly worsened since 03/16/2018 CT and similar to slightly worsened since 03/17/2018 MRI. Enhancement is preserved in the pancreatic head. Diffuse peripancreatic fluid and fat stranding is similar. No discrete pancreatic mass. No pancreatic duct dilation. Spleen: Normal size. No mass. Adrenals/Urinary Tract: Normal adrenals. No hydronephrosis. Subcentimeter hypodense renal cortical lesions in the mid to lower right kidney are too small to characterize and unchanged. Normal bladder.  Stomach/Bowel: Enteric tube terminates in the distal stomach. Stomach appears unremarkable. Diffuse moderate small bowel wall thickening without significant small bowel dilatation, worsened from prior CT. Normal appendix. Normal large bowel with no diverticulosis, large bowel wall thickening or pericolonic fat stranding. Vascular/Lymphatic: Atherosclerotic nonaneurysmal abdominal aorta. Patent portal, splenic, hepatic and renal veins. Enlarged 1.9 cm porta hepatis node (series 3/image 34), increased from 1.4 cm. No additional pathologically enlarged lymph nodes in the abdomen or pelvis. Reproductive: Top-normal size prostate with nonspecific internal prostatic calcifications. Other: No pneumoperitoneum. Small to moderate volume ascites, predominantly perihepatic, increased. No focal measurable fluid collection. Mild anasarca, new. Musculoskeletal: No aggressive appearing focal osseous lesions. Moderate thoracolumbar spondylosis.  IMPRESSION: 1. Necrotizing pancreatitis as detailed, similar to slightly worsened since 03/17/2018 MRI, significantly worsened since 03/16/2018 CT. 2. Worsening third-spacing of fluid, with increased small to moderate volume ascites, increased small dependent bilateral pleural effusions, new anasarca and increased diffuse small bowel wall thickening. 3. Mild porta hepatis adenopathy is increased and probably reactive. 4. Cholelithiasis.  No biliary ductal dilatation. Electronically Signed   By: Delbert PhenixJason A Poff M.D.   On: 03/22/2018 09:06   Ct Abdomen Pelvis W Contrast  Result Date: 03/16/2018 CLINICAL DATA:  67 y/o  M; epigastric pain. EXAM: CT ABDOMEN AND PELVIS WITH CONTRAST TECHNIQUE: Multidetector CT imaging of the abdomen and pelvis was performed using the standard protocol following bolus administration of intravenous contrast. CONTRAST:  100 cc Omnipaque 300 COMPARISON:  01/25/2008 CT of the abdomen and pelvis. FINDINGS: Lower chest: No acute abnormality. Hepatobiliary: Mild intra  and extrahepatic biliary ductal dilatation with the common bile duct measuring up to 11 mm. No focal liver lesion. Cholelithiasis. Pancreas: Severe extensive edema surrounding the pancreas compatible with acute pancreatitis. No discrete acute peripancreatic collection. Spleen: Normal in size without focal abnormality. Adrenals/Urinary Tract: Adrenal glands are unremarkable. Kidneys are normal, without renal calculi, focal lesion, or hydronephrosis. Bladder is unremarkable. Stomach/Bowel: Stomach is within normal limits. Mild wall thickening of the duodenum, likely reactive inflammatory changes. Appendix appears normal. No evidence of bowel wall thickening, distention, or inflammatory changes of the jejunum, ileum, or colon. Vascular/Lymphatic: Severe mixed atherosclerosis of the aorta. Bilateral common iliac and internal iliac artery aneurysms. The right common iliac artery aneurysm measures 2 cm and left 1.7 cm. No lymphadenopathy. Reproductive: Prostate is unremarkable. Other: No abdominal wall hernia or abnormality. No abdominopelvic ascites. Musculoskeletal: No fracture is seen. Moderate degenerative changes of the lumbar spine. IMPRESSION: 1. Acute pancreatitis with extensive retroperitoneal edema. No discrete acute peripancreatic collection identified at this time. Mild intra and extrahepatic biliary ductal dilatation with cholelithiasis may represent choledocholithiasis and gallstone pancreatitis. This can be further characterized with MRI/MRCP of the abdomen. 2. Severe aortic atherosclerosis with bilateral common iliac artery and internal iliac artery aneurysms. Electronically Signed   By: Mitzi HansenLance  Furusawa-Stratton M.D.   On: 03/16/2018 05:49   Mr 3d Recon At Scanner  Result Date: 03/17/2018 CLINICAL DATA:  Acute pancreatitis, etiology unknown. Cholelithiasis, diabetes and hepatitis C. EXAM: MRI ABDOMEN WITHOUT AND WITH CONTRAST (INCLUDING MRCP) TECHNIQUE: Multiplanar multisequence MR imaging of the  abdomen was performed both before and after the administration of intravenous contrast. Heavily T2-weighted images of the biliary and pancreatic ducts were obtained, and three-dimensional MRCP images were rendered by post processing. CONTRAST:  15mL MULTIHANCE GADOBENATE DIMEGLUMINE 529 MG/ML IV SOLN COMPARISON:  Abdominal CT 03/16/2018 and 01/25/2008. FINDINGS: Lower chest: New small bilateral pleural effusions with associated subsegmental atelectasis at both lung bases. Hepatobiliary: The liver is normal in appearance without morphologic changes of cirrhosis, focal lesion or abnormal enhancement. Large gallstones are again noted. The common hepatic duct measures 7 mm in diameter. There is no evidence of choledocholithiasis. Pancreas: There is moderate diffuse enlargement of the pancreas with extensive surrounding inflammatory changes. Prior to contrast, there is heterogeneous increased T1 signal throughout the pancreatic body and tail, suspicious for hemorrhage. Following contrast, there is very poor enhancement of the pancreas, especially in the body and tail. Portions of the pancreatic head enhance normally. There is no pancreatic ductal dilatation. Normal pancreatic ductal anatomy. Spleen: Normal in size without focal abnormality. Adrenals/Urinary Tract: Both adrenal glands appear normal. Small bilateral renal cysts. No evidence of  renal mass or hydronephrosis. Stomach/Bowel: No evidence of bowel wall thickening or focal inflammation. Stable mild colonic dilatation, most consistent with an ileus. Vascular/Lymphatic: There are no enlarged abdominal lymph nodes. No significant vascular findings. The portal, superior mesenteric and splenic veins are patent. Other: There is a moderate amount of ascites. There is extensive inflammation and ill-defined fluid throughout the retroperitoneal and mesenteric fat. No well-defined fluid collections are demonstrated. Musculoskeletal: No acute or significant osseous findings.  IMPRESSION: 1. Worsening severe acute pancreatitis with suspicion of hemorrhagic components. There is a very poor enhancement of most of the pancreas, consistent with necrotizing pancreatitis and implying a poor prognosis. 2. Associated increased ascites, inflammation and ill-defined fluid throughout the intra-abdominal fat, small bilateral pleural effusions and bibasilar atelectasis. 3. Cholelithiasis without evidence of significant biliary dilatation or choledocholithiasis. Electronically Signed   By: Carey Bullocks M.D.   On: 03/17/2018 10:41   Dg Chest Port 1 View  Result Date: 03/17/2018 CLINICAL DATA:  Short of breath and abdominal pain.  Pancreatitis. EXAM: PORTABLE CHEST 1 VIEW COMPARISON:  01/20/2018 FINDINGS: Midline trachea. Normal heart size for level of inspiration. Aortic atherosclerosis. Probable layering small left pleural effusion. No pneumothorax. Low lung volumes with resultant pulmonary interstitial prominence. Left greater than right base airspace disease is new. IMPRESSION: Low lung volumes with probable small left pleural effusion and bibasilar airspace disease. Airspace disease is favored to represent atelectasis. Especially at the left lung base, infection or aspiration cannot be excluded Aortic Atherosclerosis (ICD10-I70.0). Electronically Signed   By: Jeronimo Greaves M.D.   On: 03/17/2018 17:26   Dg Abd Portable 1v  Result Date: 03/19/2018 CLINICAL DATA:  Feeding tube placement EXAM: PORTABLE ABDOMEN - 1 VIEW COMPARISON:  03/18/2018 FINDINGS: Feeding tube has been placed with the tip in the distal descending duodenum. Diffuse gaseous distention of bowel, likely ileus, similar to prior study. IMPRESSION: Feeding tube tip in the distal descending duodenum. Electronically Signed   By: Charlett Nose M.D.   On: 03/19/2018 10:36   Dg Abd Portable 2v  Result Date: 03/18/2018 CLINICAL DATA:  History of hypertension. EXAM: PORTABLE ABDOMEN - 2 VIEW COMPARISON:  None. FINDINGS: Small  pleural effusions. Air-filled loops of large and small bowel are identified with air to the level the rectum. There is no definitive dilatation. No free air, portal venous gas, or pneumatosis. No other acute abnormalities identified. IMPRESSION: Mildly prominent air-filled loops of large and small bowel without obstruction. A mild ileus is not excluded. Tiny pleural effusions. No other acute abnormalities. Electronically Signed   By: Gerome Sam III M.D   On: 03/18/2018 09:18   Mr Abdomen Mrcp W Wo Contast  Result Date: 03/17/2018 CLINICAL DATA:  Acute pancreatitis, etiology unknown. Cholelithiasis, diabetes and hepatitis C. EXAM: MRI ABDOMEN WITHOUT AND WITH CONTRAST (INCLUDING MRCP) TECHNIQUE: Multiplanar multisequence MR imaging of the abdomen was performed both before and after the administration of intravenous contrast. Heavily T2-weighted images of the biliary and pancreatic ducts were obtained, and three-dimensional MRCP images were rendered by post processing. CONTRAST:  15mL MULTIHANCE GADOBENATE DIMEGLUMINE 529 MG/ML IV SOLN COMPARISON:  Abdominal CT 03/16/2018 and 01/25/2008. FINDINGS: Lower chest: New small bilateral pleural effusions with associated subsegmental atelectasis at both lung bases. Hepatobiliary: The liver is normal in appearance without morphologic changes of cirrhosis, focal lesion or abnormal enhancement. Large gallstones are again noted. The common hepatic duct measures 7 mm in diameter. There is no evidence of choledocholithiasis. Pancreas: There is moderate diffuse enlargement of the pancreas with extensive  surrounding inflammatory changes. Prior to contrast, there is heterogeneous increased T1 signal throughout the pancreatic body and tail, suspicious for hemorrhage. Following contrast, there is very poor enhancement of the pancreas, especially in the body and tail. Portions of the pancreatic head enhance normally. There is no pancreatic ductal dilatation. Normal pancreatic  ductal anatomy. Spleen: Normal in size without focal abnormality. Adrenals/Urinary Tract: Both adrenal glands appear normal. Small bilateral renal cysts. No evidence of renal mass or hydronephrosis. Stomach/Bowel: No evidence of bowel wall thickening or focal inflammation. Stable mild colonic dilatation, most consistent with an ileus. Vascular/Lymphatic: There are no enlarged abdominal lymph nodes. No significant vascular findings. The portal, superior mesenteric and splenic veins are patent. Other: There is a moderate amount of ascites. There is extensive inflammation and ill-defined fluid throughout the retroperitoneal and mesenteric fat. No well-defined fluid collections are demonstrated. Musculoskeletal: No acute or significant osseous findings. IMPRESSION: 1. Worsening severe acute pancreatitis with suspicion of hemorrhagic components. There is a very poor enhancement of most of the pancreas, consistent with necrotizing pancreatitis and implying a poor prognosis. 2. Associated increased ascites, inflammation and ill-defined fluid throughout the intra-abdominal fat, small bilateral pleural effusions and bibasilar atelectasis. 3. Cholelithiasis without evidence of significant biliary dilatation or choledocholithiasis. Electronically Signed   By: Carey Bullocks M.D.   On: 03/17/2018 10:41   [place] Ekg Site Rite  Result Date: 03/25/2018 If Site Rite image not attached, placement could not be confirmed due to current cardiac rhythm.    LOS: 9 days   Zannie Cove, MD  [facility]  If 7PM-7AM, please contact night-coverage  Please page via [email or web address]  03/25/2018, 1:01 PM

## 2018-03-25 NOTE — Progress Notes (Signed)
Peripherally Inserted Central Catheter/Midline Placement  The IV Nurse has discussed with the patient and/or persons authorized to consent for the patient, the purpose of this procedure and the potential benefits and risks involved with this procedure.  The benefits include less needle sticks, lab draws from the catheter, and the patient may be discharged home with the catheter. Risks include, but not limited to, infection, bleeding, blood clot (thrombus formation), and puncture of an artery; nerve damage and irregular heartbeat and possibility to perform a PICC exchange if needed/ordered by physician.  Alternatives to this procedure were also discussed.  Bard Power PICC patient education guide, fact sheet on infection prevention and patient information card has been provided to patient /or left at bedside.    PICC/Midline Placement Documentation  PICC Single Lumen 03/25/18 PICC Right Basilic 40 cm 0 cm (Active)  Indication for Insertion or Continuance of Line Home intravenous therapies (PICC only) 03/25/2018  9:00 PM  Exposed Catheter (cm) 0 cm 03/25/2018  9:00 PM  Site Assessment Clean;Dry;Intact 03/25/2018  9:00 PM  Line Status Flushed;Saline locked;Blood return noted 03/25/2018  9:00 PM  Dressing Type Transparent 03/25/2018  9:00 PM  Dressing Status Clean;Dry;Intact;Antimicrobial disc in place 03/25/2018  9:00 PM  Dressing Change Due 04/01/18 03/25/2018  9:00 PM       Ethelda ChickCurrie, Yeng Perz Robert 03/25/2018, 9:02 PM

## 2018-03-26 LAB — COMPREHENSIVE METABOLIC PANEL
ALK PHOS: 66 U/L (ref 38–126)
ALT: 50 U/L — AB (ref 0–44)
ANION GAP: 5 (ref 5–15)
AST: 39 U/L (ref 15–41)
Albumin: 2.6 g/dL — ABNORMAL LOW (ref 3.5–5.0)
BILIRUBIN TOTAL: 1 mg/dL (ref 0.3–1.2)
BUN: 19 mg/dL (ref 8–23)
CALCIUM: 8.4 mg/dL — AB (ref 8.9–10.3)
CO2: 35 mmol/L — ABNORMAL HIGH (ref 22–32)
CREATININE: 0.66 mg/dL (ref 0.61–1.24)
Chloride: 97 mmol/L — ABNORMAL LOW (ref 98–111)
Glucose, Bld: 204 mg/dL — ABNORMAL HIGH (ref 70–99)
Potassium: 4.2 mmol/L (ref 3.5–5.1)
Sodium: 137 mmol/L (ref 135–145)
TOTAL PROTEIN: 5.3 g/dL — AB (ref 6.5–8.1)

## 2018-03-26 LAB — GLUCOSE, CAPILLARY: GLUCOSE-CAPILLARY: 259 mg/dL — AB (ref 70–99)

## 2018-03-26 LAB — CBC
HCT: 38.7 % — ABNORMAL LOW (ref 39.0–52.0)
Hemoglobin: 13 g/dL (ref 13.0–17.0)
MCH: 30.9 pg (ref 26.0–34.0)
MCHC: 33.6 g/dL (ref 30.0–36.0)
MCV: 91.9 fL (ref 78.0–100.0)
PLATELETS: 288 10*3/uL (ref 150–400)
RBC: 4.21 MIL/uL — AB (ref 4.22–5.81)
RDW: 12.8 % (ref 11.5–15.5)
WBC: 12 10*3/uL — AB (ref 4.0–10.5)

## 2018-03-26 LAB — MAGNESIUM: MAGNESIUM: 2.2 mg/dL (ref 1.7–2.4)

## 2018-03-26 MED ORDER — MEROPENEM IV (FOR PTA / DISCHARGE USE ONLY): 1.0000 g | Freq: Three times a day (TID) | INTRAVENOUS | 0 refills | Status: DC

## 2018-03-26 NOTE — Discharge Summary (Signed)
Physician Discharge Summary  Ryan Chan YTK:[id number] DOB: 08-Jan-1951 DOA: 03/16/2018  PCP: System, Pcp Not In  Admit date: 03/16/2018 Discharge date: 03/26/2018  Time spent: 45 minutes  Recommendations for Outpatient Follow-up:  1. Dr.Matt Donne Hazel in 3weeks to evaluate for Lap Chole 2. Dr.Gabriel Mansouraty ( GI)  in 2 weeks  3. Continue IV meropenem until 9/5, please discontinue PICC line once antibiotic course completed 4. Home Health RN   Discharge Diagnoses:  Principal Problem:   Acute gallstone pancreatitis   Necrotizing pancreatitis   Elevated LFTs   Hepatitis C   Essential hypertension   Diet-controlled diabetes mellitus ([place])   FLuid Overload  Discharge Condition: stable  Diet recommendation: Diabetic  Filed Weights   03/24/18 0458 03/25/18 0438 03/26/18 0423  Weight: 77.4 kg 75.3 kg 73.3 kg    History of present illness:  Patient is a 67 y.o. male with a history of hypertension, dyslipidemia presented with acute gallstone pancreatitis  Hospital Course:   Acute necrotizing gallstone pancreatitis: -on admission CT scan 8/13 noted acute pancreatitis,mild intraand extrahepatic biliary ductal dilatation with cholelithiasis -MRCP 8/14 no choledocholithiasis, worsening pancreatitis with possible hemorrhagic component or necrotizing pancreatitis -treated with bowel rest, aggressive IV fluid hydration, supportive care -His hospital course was complicated with fevers, given concern for necrotizing pancreatitis he was started on IV ertapenem, nasogastric tube was placed into the jejunum and he was started on tube feeds,  -Followed closely by gastroenterology and general surgery -Clinically improving at this time, cleaned off tube feeds, tolerating soft diet -Continue meropenem until 9/5 to complete a total three-week course of antibiotics per GI recommendations -general surgery recommended follow-up in 2-3 weeks for interval  cholecystectomy  Diarrhea -Due to tube feeds, C. Difficile PCR is negative,  -resolved    Volume overload with mild pulmonary edema:  -Improvedwith diuresis, IV fluids discontinued -transthoracic echocardiogram done at [facility] June 2019 showed a preserved EF.He also had a negative stress test in June 2019 as well. -euvolemic now, doesn't need diuretics any further  Acute kidney injury: Hemodynamically mediated in the setting of necrotizing pancreatitis resolved  Hypophosphatemia.  Replaced and stable.  Hypokalemia.  Replaced will monitor.    Hypertension: -stable, continue Coreg.   Discharge Exam: Vitals:   03/26/18 0423 03/26/18 0738  BP: 135/60 119/66  Pulse: 79 74  Resp: 18 14  Temp: 98.6 F (37 C) 98.5 F (36.9 C)  SpO2: 93% 96%    General: AAOx3 Cardiovascular: S1S2/RRR Respiratory: CTAB  Discharge Instructions   Discharge Instructions    Diet - low sodium heart healthy   Complete by:  As directed    Home infusion instructions [facility] May follow [place] Dosing Protocol; May administer Cathflo as needed to maintain patency of vascular access device.; Flushing of vascular access device: per [facility] Protocol: 0.9% NaCl pre/post medica...   Complete by:  As directed    Instructions:  May follow [place] Dosing Protocol   Instructions:  May administer Cathflo as needed to maintain patency of vascular access device.   Instructions:  Flushing of vascular access device: per [facility] Protocol: 0.9% NaCl pre/post medication administration and prn patency; Heparin 100 u/ml, 47m for implanted ports and Heparin 10u/ml, 524mfor all other central venous catheters.   Instructions:  May follow [facility] Anaphylaxis Protocol for First Dose Administration in the home: 0.9% NaCl at 25-50 ml/hr to maintain IV access for protocol meds. Epinephrine 0.3 ml IV/IM PRN and Benadryl 25-50 IV/IM PRN s/s of anaphylaxis.  Instructions:  [facility]  Coordinator (RN) to assist per patient IV care needs in the home PRN.   Increase activity slowly   Complete by:  As directed      Allergies as of 03/26/2018   No Known Allergies     Medication List    STOP taking these medications   atorvastatin 20 MG tablet Commonly known as:  LIPITOR   lisinopril 5 MG tablet Commonly known as:  PRINIVIL,ZESTRIL     TAKE these medications   meropenem  IVPB Commonly known as:  MERREM Inject 1 g into the vein every 8 (eight) hours. Indication:  acute gallstone pancreatitis with necrosis Last Day of Therapy:  04/08/1028 Labs - Once weekly:  CBC/D and BMP, Labs - Every other week:  ESR and CRP   metoprolol succinate 25 MG 24 hr tablet Commonly known as:  TOPROL-XL Take 25 mg by mouth daily.   nitroGLYCERIN 0.4 MG SL tablet Commonly known as:  NITROSTAT Place 0.4 mg under the tongue every 5 (five) minutes as needed for chest pain.            Home Infusion Instuctions  (From admission, onward)         Start     Ordered   03/26/18 0000  Home infusion instructions [facility] May follow [place] Dosing Protocol; May administer Cathflo as needed to maintain patency of vascular access device.; Flushing of vascular access device: per [facility] Protocol: 0.9% NaCl pre/post medica...    Question Answer Comment  Instructions May follow [place] Dosing Protocol   Instructions May administer Cathflo as needed to maintain patency of vascular access device.   Instructions Flushing of vascular access device: per [facility] Protocol: 0.9% NaCl pre/post medication administration and prn patency; Heparin 100 u/ml, 61m for implanted ports and Heparin 10u/ml, 570mfor all other central venous catheters.   Instructions May follow [facility] Anaphylaxis Protocol for First Dose Administration in the home: 0.9% NaCl at 25-50 ml/hr to maintain IV access for protocol meds. Epinephrine 0.3 ml IV/IM PRN and Benadryl 25-50 IV/IM PRN s/s of anaphylaxis.   Instructions  [facility] Infusion Coordinator (RN) to assist per patient IV care needs in the home PRN.      03/26/18 1112         No Known Allergies Follow-up Information    WaRolm BookbinderMD. Schedule an appointment as soon as possible for a visit in 3 week(s).   Specialty:  General Surgery Why:  to discuss removal of gallbladder Contact information: [address] [phone]        Mansouraty, GaTelford Nab MD. Schedule an appointment as soon as possible for a visit in 2 week(s).   Specialties:  Gastroenterology, Internal Medicine Contact information: [address] [phone]          The results of significant diagnostics from this hospitalization (including imaging, microbiology, ancillary and laboratory) are listed below for reference.    Significant Diagnostic Studies: Ct Abdomen Pelvis W Contrast  Result Date: 03/22/2018 CLINICAL DATA:  Inpatient.  Necrotizing gallstone pancreatitis. EXAM: CT ABDOMEN AND PELVIS WITH CONTRAST TECHNIQUE: Multidetector CT imaging of the abdomen and pelvis was performed using the standard protocol following bolus administration of intravenous contrast. CONTRAST:  10014mSOVUE-300 IOPAMIDOL (ISOVUE-300) INJECTION 61% COMPARISON:  04/02/2018 CT abdomen/pelvis.  03/17/2018 MRI abdomen. FINDINGS: Lower chest: Small dependent bilateral pleural effusions, mildly increased bilaterally. Dependent atelectasis  at both lung bases. Hepatobiliary: Normal liver size. No liver mass. Cholelithiasis. No definite gallbladder wall thickening. No biliary ductal dilatation. CBD diameter 5 mm. Pancreas: There is absence of parenchymal enhancement throughout much of the pancreatic body and portions of the pancreatic tail, compatible with necrotizing pancreatitis, significantly worsened since 03/16/2018 CT and similar to slightly worsened since 03/17/2018 MRI. Enhancement is preserved in the pancreatic head.  Diffuse peripancreatic fluid and fat stranding is similar. No discrete pancreatic mass. No pancreatic duct dilation. Spleen: Normal size. No mass. Adrenals/Urinary Tract: Normal adrenals. No hydronephrosis. Subcentimeter hypodense renal cortical lesions in the mid to lower right kidney are too small to characterize and unchanged. Normal bladder. Stomach/Bowel: Enteric tube terminates in the distal stomach. Stomach appears unremarkable. Diffuse moderate small bowel wall thickening without significant small bowel dilatation, worsened from prior CT. Normal appendix. Normal large bowel with no diverticulosis, large bowel wall thickening or pericolonic fat stranding. Vascular/Lymphatic: Atherosclerotic nonaneurysmal abdominal aorta. Patent portal, splenic, hepatic and renal veins. Enlarged 1.9 cm porta hepatis node (series 3/image 34), increased from 1.4 cm. No additional pathologically enlarged lymph nodes in the abdomen or pelvis. Reproductive: Top-normal size prostate with nonspecific internal prostatic calcifications. Other: No pneumoperitoneum. Small to moderate volume ascites, predominantly perihepatic, increased. No focal measurable fluid collection. Mild anasarca, new. Musculoskeletal: No aggressive appearing focal osseous lesions. Moderate thoracolumbar spondylosis. IMPRESSION: 1. Necrotizing pancreatitis as detailed, similar to slightly worsened since 03/17/2018 MRI, significantly worsened since 03/16/2018 CT. 2. Worsening third-spacing of fluid, with increased small to moderate volume ascites, increased small dependent bilateral pleural effusions, new anasarca and increased diffuse small bowel wall thickening. 3. Mild porta hepatis adenopathy is increased and probably reactive. 4. Cholelithiasis.  No biliary ductal dilatation. Electronically Signed   By: Ilona Sorrel M.D.   On: 03/22/2018 09:06   Ct Abdomen Pelvis W Contrast  Result Date: 03/16/2018 CLINICAL DATA:  67 y/o  M; epigastric pain. EXAM: CT  ABDOMEN AND PELVIS WITH CONTRAST TECHNIQUE: Multidetector CT imaging of the abdomen and pelvis was performed using the standard protocol following bolus administration of intravenous contrast. CONTRAST:  100 cc Omnipaque 300 COMPARISON:  01/25/2008 CT of the abdomen and pelvis. FINDINGS: Lower chest: No acute abnormality. Hepatobiliary: Mild intra and extrahepatic biliary ductal dilatation with the common bile duct measuring up to 11 mm. No focal liver lesion. Cholelithiasis. Pancreas: Severe extensive edema surrounding the pancreas compatible with acute pancreatitis. No discrete acute peripancreatic collection. Spleen: Normal in size without focal abnormality. Adrenals/Urinary Tract: Adrenal glands are unremarkable. Kidneys are normal, without renal calculi, focal lesion, or hydronephrosis. Bladder is unremarkable. Stomach/Bowel: Stomach is within normal limits. Mild wall thickening of the duodenum, likely reactive inflammatory changes. Appendix appears normal. No evidence of bowel wall thickening, distention, or inflammatory changes of the jejunum, ileum, or colon. Vascular/Lymphatic: Severe mixed atherosclerosis of the aorta. Bilateral common iliac and internal iliac artery aneurysms. The right common iliac artery aneurysm measures 2 cm and left 1.7 cm. No lymphadenopathy. Reproductive: Prostate is unremarkable. Other: No abdominal wall hernia or abnormality. No abdominopelvic ascites. Musculoskeletal: No fracture is seen. Moderate degenerative changes of the lumbar spine. IMPRESSION: 1. Acute pancreatitis with extensive retroperitoneal edema. No discrete acute peripancreatic collection identified at this time. Mild intra and extrahepatic biliary ductal dilatation with cholelithiasis may represent choledocholithiasis and gallstone pancreatitis. This can be further characterized with MRI/MRCP of the abdomen. 2. Severe aortic atherosclerosis with bilateral common iliac artery and internal iliac artery aneurysms.  Electronically Signed   By: Kristine Garbe  M.D.   On: 03/16/2018 05:49   Mr 3d Recon At Scanner  Result Date: 03/17/2018 CLINICAL DATA:  Acute pancreatitis, etiology unknown. Cholelithiasis, diabetes and hepatitis C. EXAM: MRI ABDOMEN WITHOUT AND WITH CONTRAST (INCLUDING MRCP) TECHNIQUE: Multiplanar multisequence MR imaging of the abdomen was performed both before and after the administration of intravenous contrast. Heavily T2-weighted images of the biliary and pancreatic ducts were obtained, and three-dimensional MRCP images were rendered by post processing. CONTRAST:  36m MULTIHANCE GADOBENATE DIMEGLUMINE 529 MG/ML IV SOLN COMPARISON:  Abdominal CT 03/16/2018 and 01/25/2008. FINDINGS: Lower chest: New small bilateral pleural effusions with associated subsegmental atelectasis at both lung bases. Hepatobiliary: The liver is normal in appearance without morphologic changes of cirrhosis, focal lesion or abnormal enhancement. Large gallstones are again noted. The common hepatic duct measures 7 mm in diameter. There is no evidence of choledocholithiasis. Pancreas: There is moderate diffuse enlargement of the pancreas with extensive surrounding inflammatory changes. Prior to contrast, there is heterogeneous increased T1 signal throughout the pancreatic body and tail, suspicious for hemorrhage. Following contrast, there is very poor enhancement of the pancreas, especially in the body and tail. Portions of the pancreatic head enhance normally. There is no pancreatic ductal dilatation. Normal pancreatic ductal anatomy. Spleen: Normal in size without focal abnormality. Adrenals/Urinary Tract: Both adrenal glands appear normal. Small bilateral renal cysts. No evidence of renal mass or hydronephrosis. Stomach/Bowel: No evidence of bowel wall thickening or focal inflammation. Stable mild colonic dilatation, most consistent with an ileus. Vascular/Lymphatic: There are no enlarged abdominal lymph nodes. No  significant vascular findings. The portal, superior mesenteric and splenic veins are patent. Other: There is a moderate amount of ascites. There is extensive inflammation and ill-defined fluid throughout the retroperitoneal and mesenteric fat. No well-defined fluid collections are demonstrated. Musculoskeletal: No acute or significant osseous findings. IMPRESSION: 1. Worsening severe acute pancreatitis with suspicion of hemorrhagic components. There is a very poor enhancement of most of the pancreas, consistent with necrotizing pancreatitis and implying a poor prognosis. 2. Associated increased ascites, inflammation and ill-defined fluid throughout the intra-abdominal fat, small bilateral pleural effusions and bibasilar atelectasis. 3. Cholelithiasis without evidence of significant biliary dilatation or choledocholithiasis. Electronically Signed   By: WRichardean SaleM.D.   On: 03/17/2018 10:41   Dg Chest Port 1 View  Result Date: 03/17/2018 CLINICAL DATA:  Short of breath and abdominal pain.  Pancreatitis. EXAM: PORTABLE CHEST 1 VIEW COMPARISON:  01/20/2018 FINDINGS: Midline trachea. Normal heart size for level of inspiration. Aortic atherosclerosis. Probable layering small left pleural effusion. No pneumothorax. Low lung volumes with resultant pulmonary interstitial prominence. Left greater than right base airspace disease is new. IMPRESSION: Low lung volumes with probable small left pleural effusion and bibasilar airspace disease. Airspace disease is favored to represent atelectasis. Especially at the left lung base, infection or aspiration cannot be excluded Aortic Atherosclerosis (ICD10-I70.0). Electronically Signed   By: KAbigail MiyamotoM.D.   On: 03/17/2018 17:26   Dg Abd Portable 1v  Result Date: 03/19/2018 CLINICAL DATA:  Feeding tube placement EXAM: PORTABLE ABDOMEN - 1 VIEW COMPARISON:  03/18/2018 FINDINGS: Feeding tube has been placed with the tip in the distal descending duodenum. Diffuse gaseous  distention of bowel, likely ileus, similar to prior study. IMPRESSION: Feeding tube tip in the distal descending duodenum. Electronically Signed   By: KRolm BaptiseM.D.   On: 03/19/2018 10:36   Dg Abd Portable 2v  Result Date: 03/18/2018 CLINICAL DATA:  History of hypertension. EXAM: PORTABLE ABDOMEN - 2 VIEW COMPARISON:  None. FINDINGS: Small pleural effusions. Air-filled loops of large and small bowel are identified with air to the level the rectum. There is no definitive dilatation. No free air, portal venous gas, or pneumatosis. No other acute abnormalities identified. IMPRESSION: Mildly prominent air-filled loops of large and small bowel without obstruction. A mild ileus is not excluded. Tiny pleural effusions. No other acute abnormalities. Electronically Signed   By: Dorise Bullion III M.D   On: 03/18/2018 09:18   Mr Abdomen Mrcp W Wo Contast  Result Date: 03/17/2018 CLINICAL DATA:  Acute pancreatitis, etiology unknown. Cholelithiasis, diabetes and hepatitis C. EXAM: MRI ABDOMEN WITHOUT AND WITH CONTRAST (INCLUDING MRCP) TECHNIQUE: Multiplanar multisequence MR imaging of the abdomen was performed both before and after the administration of intravenous contrast. Heavily T2-weighted images of the biliary and pancreatic ducts were obtained, and three-dimensional MRCP images were rendered by post processing. CONTRAST:  21m MULTIHANCE GADOBENATE DIMEGLUMINE 529 MG/ML IV SOLN COMPARISON:  Abdominal CT 03/16/2018 and 01/25/2008. FINDINGS: Lower chest: New small bilateral pleural effusions with associated subsegmental atelectasis at both lung bases. Hepatobiliary: The liver is normal in appearance without morphologic changes of cirrhosis, focal lesion or abnormal enhancement. Large gallstones are again noted. The common hepatic duct measures 7 mm in diameter. There is no evidence of choledocholithiasis. Pancreas: There is moderate diffuse enlargement of the pancreas with extensive surrounding inflammatory  changes. Prior to contrast, there is heterogeneous increased T1 signal throughout the pancreatic body and tail, suspicious for hemorrhage. Following contrast, there is very poor enhancement of the pancreas, especially in the body and tail. Portions of the pancreatic head enhance normally. There is no pancreatic ductal dilatation. Normal pancreatic ductal anatomy. Spleen: Normal in size without focal abnormality. Adrenals/Urinary Tract: Both adrenal glands appear normal. Small bilateral renal cysts. No evidence of renal mass or hydronephrosis. Stomach/Bowel: No evidence of bowel wall thickening or focal inflammation. Stable mild colonic dilatation, most consistent with an ileus. Vascular/Lymphatic: There are no enlarged abdominal lymph nodes. No significant vascular findings. The portal, superior mesenteric and splenic veins are patent. Other: There is a moderate amount of ascites. There is extensive inflammation and ill-defined fluid throughout the retroperitoneal and mesenteric fat. No well-defined fluid collections are demonstrated. Musculoskeletal: No acute or significant osseous findings. IMPRESSION: 1. Worsening severe acute pancreatitis with suspicion of hemorrhagic components. There is a very poor enhancement of most of the pancreas, consistent with necrotizing pancreatitis and implying a poor prognosis. 2. Associated increased ascites, inflammation and ill-defined fluid throughout the intra-abdominal fat, small bilateral pleural effusions and bibasilar atelectasis. 3. Cholelithiasis without evidence of significant biliary dilatation or choledocholithiasis. Electronically Signed   By: WRichardean SaleM.D.   On: 03/17/2018 10:41   UKoreaEkg Site Rite  Result Date: 03/25/2018 If Site Rite image not attached, placement could not be confirmed due to current cardiac rhythm.   Microbiology: Recent Results (from the past 240 hour(s))  Culture, blood (routine x 2)     Status: None   Collection Time: 03/19/18   2:43 PM  Result Value Ref Range Status   Specimen Description BLOOD LEFT ARM  Final   Special Requests   Final    BOTTLES DRAWN AEROBIC AND ANAEROBIC Blood Culture adequate volume   Culture   Final    NO GROWTH 5 DAYS Performed at [facility] [address], [place]  [address]   Report Status 03/24/2018 FINAL  Final  Culture, blood (routine x 2)     Status: None  Collection Time: 03/19/18  2:51 PM  Result Value Ref Range Status   Specimen Description BLOOD RIGHT HAND  Final   Special Requests   Final    BOTTLES DRAWN AEROBIC AND ANAEROBIC Blood Culture adequate volume   Culture   Final    NO GROWTH 5 DAYS Performed at [facility], [address], [place], [place] [address]    Report Status 03/24/2018 FINAL  Final  C difficile quick scan w PCR reflex     Status: None   Collection Time: 03/22/18  4:22 PM  Result Value Ref Range Status   C Diff antigen NEGATIVE NEGATIVE Final   C Diff toxin NEGATIVE NEGATIVE Final   C Diff interpretation No C. difficile detected.  Final    Comment: Performed at [facility], [place] [address], [place], [place] [address]     Labs: Basic Metabolic Panel: Recent Labs  Lab 03/20/18 0348  03/22/18 0357 03/23/18 0355 03/24/18 0312 03/25/18 0301 03/26/18 0323  NA 139   < > 140 138 138 136 137  K 3.7   < > 3.6 3.3* 4.2 3.7 4.2  CL 102   < > 101 97* 97* 94* 97*  CO2 28   < > 31 35* 35* 33* 35*  GLUCOSE 170*   < > 191* 222* 267* 294* 204*  BUN 17   < > '16 13 17 17 19  ' CREATININE 0.71   < > 0.57* 0.80 0.74 0.66 0.66  CALCIUM 7.7*   < > 7.5* 7.9* 8.0* 8.1* 8.4*  MG 2.3  --  2.2 2.3 2.3 2.1 2.2  PHOS 1.2*  --  2.1* 2.6 2.8  --   --    < > = values in this interval not displayed.   Liver Function Tests: Recent Labs  Lab 03/22/18 0357 03/23/18 0355 03/24/18 0312 03/25/18 0301 03/26/18 0323  AST 31 34 52* 45* 39  ALT 30 35 44 52* 50*  ALKPHOS 55 67 63 69 66  BILITOT 0.9 1.1 1.0 0.8 1.0  PROT 4.9* 5.6* 5.1* 5.2*  5.3*  ALBUMIN 2.4* 2.8* 2.5* 2.6* 2.6*   Recent Labs  Lab 03/21/18 0241  LIPASE 50   No results for input(s): AMMONIA in the last 168 hours. CBC: Recent Labs  Lab 03/22/18 0357 03/23/18 0355 03/24/18 0312 03/25/18 0301 03/26/18 0323  WBC 11.3* 11.9* 12.7* 13.0* 12.0*  HGB 14.2 14.8 13.4 13.5 13.0  HCT 43.5 45.1 41.0 40.6 38.7*  MCV 94.6 93.2 93.0 92.3 91.9  PLT 180 221 252 262 288   Cardiac Enzymes: No results for input(s): CKTOTAL, CKMB, CKMBINDEX, TROPONINI in the last 168 hours. BNP: BNP (last 3 results) No results for input(s): BNP in the last 8760 hours.  ProBNP (last 3 results) No results for input(s): PROBNP in the last 8760 hours.  CBG: Recent Labs  Lab 03/25/18 0905 03/25/18 1224 03/25/18 1627 03/25/18 2138 03/26/18 0650  GLUCAP 319* 196* 279* 212* 259*       Signed:  Domenic Polite MD.  [facility] 03/26/2018, 11:13 AM

## 2018-03-26 NOTE — Final Consult Note (Signed)
Consultant Final Sign-Off Note    Assessment/Final recommendations  Ryan Chan is a 67 y.o. male followed by me for gallstone pancreatitis   Wound care (if applicable):    Diet at discharge: per primary team   Activity at discharge: per primary team   Follow-up appointment:  Pt knows to call our office to see Dr. Dwain SarnaWakefield in roughly 3 weeks    Pending results:  Unresulted Labs (From admission, onward)    Start     Ordered   03/25/18 0403  Glucose, capillary  Once,   R     03/25/18 0403   03/23/18 0500  HCV Ab Reflex to Quant PCR  Tomorrow morning,   R    Question:  Specimen collection method  Answer:  Lab=Lab collect   03/22/18 2157           Medication recommendations:   Other recommendations:    Thank you for allowing us to participate in the care of your patient!  Please consult us again if you have further needs for your patient.  Jerre SimonJessica L Garey Alleva 03/26/2018 8:45 AM    Subjective   CC: gallstone pancreatitis   Intermittent abdominal pain but overall greatly improved. Tolerating diet and having bowel function. Copious flatus. No issues overnight. No family at bedside.   Objective  Vital signs in last 24 hours: Temp:  [98.5 F (36.9 C)-98.9 F (37.2 C)] 98.5 F (36.9 C) (08/23 0738) Pulse Rate:  [54-83] 74 (08/23 0738) Resp:  [14-20] 14 (08/23 0738) BP: (111-144)/(55-70) 119/66 (08/23 0738) SpO2:  [90 %-96 %] 96 % (08/23 0738) Weight:  [73.3 kg] 73.3 kg (08/23 0423)  PE: Gen: Alert, NAD, pleasant, cooperative Pulm:Rate andeffort normal Abd: Soft,distended,+BS,no TTP, noguarding, no peritonitis Skin: no rashes noted, warm and dry  Pertinent labs and Studies: Recent Labs    03/24/18 0312 03/25/18 0301 03/26/18 0323  WBC 12.7* 13.0* 12.0*  HGB 13.4 13.5 13.0  HCT 41.0 40.6 38.7*   BMET Recent Labs    03/25/18 0301 03/26/18 0323  NA 136 137  K 3.7 4.2  CL 94* 97*  CO2 33* 35*  GLUCOSE 294* 204*  BUN 17 19   CREATININE 0.66 0.66  CALCIUM 8.1* 8.4*   No results for input(s): LABURIN in the last 72 hours. Results for orders placed or performed during the hospital encounter of 03/16/18  Culture, blood (routine x 2)     Status: None   Collection Time: 03/19/18  2:43 PM  Result Value Ref Range Status   Specimen Description BLOOD LEFT ARM  Final   Special Requests   Final    BOTTLES DRAWN AEROBIC AND ANAEROBIC Blood Culture adequate volume   Culture   Final    NO GROWTH 5 DAYS Performed at [facility], [address] [place], [place] [address]    Report Status 03/24/2018 FINAL  Final  Culture, blood (routine x 2)     Status: None   Collection Time: 03/19/18  2:51 PM  Result Value Ref Range Status   Specimen Description BLOOD RIGHT HAND  Final   Special Requests   Final    BOTTLES DRAWN AEROBIC AND ANAEROBIC Blood Culture adequate volume   Culture   Final    NO GROWTH 5 DAYS Performed at [facility], [address] [place], [place] [address]    Report Status 03/24/2018 FINAL  Final  C difficile quick scan w PCR reflex     Status: None  Collection Time: 03/22/18  4:22 PM  Result Value Ref Range Status   C Diff antigen NEGATIVE NEGATIVE Final   C Diff toxin NEGATIVE NEGATIVE Final   C Diff interpretation No C. difficile detected.  Final    Comment: Performed at [facility], [address] [place], [place] [address]    Imaging: [place] Ekg Site Rite  Result Date: 03/25/2018 If [facility] image not attached, placement could not be confirmed due to current cardiac rhythm.

## 2018-03-26 NOTE — Progress Notes (Signed)
PT Cancellation Note  Patient Details Name: Waynetta PeanCharles Dean Linthicum MRN: [id number] DOB: 10/27/1950   Cancelled Treatment:    Reason Eval/Treat Not Completed: Other (comment)(Pt going home, refused PT. )   Conchetta Lamia F Rickiya Picariello 03/26/2018, 1:16 PM  [facility] Erdman,PT Acute Rehabilitation [phone] (pager)

## 2018-03-26 NOTE — Progress Notes (Signed)
PHARMACY CONSULT NOTE FOR:  OUTPATIENT  PARENTERAL ANTIBIOTIC THERAPY (OPAT)  Indication: acute gallstone pancreatitis with necrosis Regimen: meropenem 1gm IV q8h End date: 04/08/2018  IV antibiotic discharge orders are pended. To discharging provider:  please sign these orders via discharge navigator,  Select New Orders & click on the button choice - Manage This Unsigned Work.     Thank you for allowing pharmacy to be a part of this patient's care.  Harland GermanAndrew Jaskirat Schwieger, PharmD Clinical Pharmacist Please check Amion for pharmacy contact number

## 2018-03-26 NOTE — Care Management Important Message (Signed)
Important Message  Patient Details  Name: Ryan PeanCharles Dean Likins MRN: [id number] Date of Birth: 1950-09-26   Medicare Important Message Given:  Yes    Arlyn Bumpus P Claribel Sachs 03/26/2018, 2:04 PM

## 2018-03-26 NOTE — Progress Notes (Signed)
Gastroenterology Inpatient Follow-up Note   PATIENT IDENTIFICATION  Ryan PeanCharles Dean Chan is a 67 y.o. male with a pmh significant for DM, HTN, reported HCV, and recent gallstone pancreatitis. Hospital Day: 11  SUBJECTIVE  Patient NG tube is out. He is tolerating a full diet at this point in time. Plan for PICC tomorrow and hopeful discharge soon.   OBJECTIVE  Scheduled Inpatient Medications:  . carvedilol  6.25 mg Oral BID WC  . enoxaparin (LOVENOX) injection  40 mg Subcutaneous Q24H  . famotidine  20 mg Oral BID  . feeding supplement (GLUCERNA SHAKE)  237 mL Oral TID BM  . furosemide  20 mg Intravenous BID  . insulin aspart  0-15 Units Subcutaneous TID WC  . insulin aspart  0-5 Units Subcutaneous QHS  . insulin glargine  10 Units Subcutaneous Daily  . nicotine  21 mg Transdermal Daily  . polyethylene glycol  17 g Oral Daily  . potassium chloride  20 mEq Oral Daily  . sodium chloride flush  10-40 mL Intracatheter Q12H   Continuous Inpatient Infusions:  . meropenem (MERREM) IV 1 g (03/26/18 0223)   PRN Inpatient Medications: acetaminophen **OR** acetaminophen, alum & mag hydroxide-simeth, bisacodyl, chlorproMAZINE (THORAZINE) injection, hydrALAZINE, hydrocortisone cream, HYDROmorphone (DILAUDID) injection, loperamide, ondansetron **OR** ondansetron (ZOFRAN) IV, oxyCODONE, senna-docusate, sodium chloride flush   Physical Examination  Temp:  [98.5 F (36.9 C)-98.9 F (37.2 C)] 98.5 F (36.9 C) (08/23 0738) Pulse Rate:  [54-83] 74 (08/23 0738) Resp:  [14-20] 14 (08/23 0738) BP: (111-144)/(55-70) 119/66 (08/23 0738) SpO2:  [90 %-96 %] 96 % (08/23 0738) Weight:  [73.3 kg] 73.3 kg (08/23 0423) Temp (24hrs), Avg:98.7 F (37.1 C), Min:98.5 F (36.9 C), Max:98.9 F (37.2 C)  Weight: 73.3 kg GEN: NAD EYE: Sclerae anicteric ENT: MMM CV: RR without R/Gs  RESP: No adventitious sounds GI: NABS, soft, NT, protuberant MSK/EXT: BLE edema present NEURO:  Alert & Oriented x  3, no focal deficits   Review of Data   Laboratory Studies   Recent Labs  Lab 03/22/18 0357 03/23/18 0355 03/24/18 0312  03/26/18 0323  NA 140 138 138   < > 137  K 3.6 3.3* 4.2   < > 4.2  CL 101 97* 97*   < > 97*  CO2 31 35* 35*   < > 35*  BUN 16 13 17    < > 19  CREATININE 0.57* 0.80 0.74   < > 0.66  GLUCOSE 191* 222* 267*   < > 204*  CALCIUM 7.5* 7.9* 8.0*   < > 8.4*  MG 2.2 2.3 2.3   < > 2.2  PHOS 2.1* 2.6 2.8  --   --    < > = values in this interval not displayed.   Recent Labs  Lab 03/26/18 0323  AST 39  ALT 50*  ALKPHOS 66    Recent Labs  Lab 03/24/18 0312 03/25/18 0301 03/26/18 0323  WBC 12.7* 13.0* 12.0*  HGB 13.4 13.5 13.0  HCT 41.0 40.6 38.7*  PLT 252 262 288   Recent Labs  Lab 03/23/18 0355  INR 1.03    GI Studies  No new studies  Imaging Studies  [place] Ekg Site Rite  Result Date: 03/25/2018 If Site Rite image not attached, placement could not be confirmed due to current cardiac rhythm.   ASSESSMENT  Mr. Ryan Chan is a 67 y.o. male with a pmh significant for DM, HTN, reported HCV, and recent gallstone pancreatitis.  This patient seems to be clinically  stabilizing.  We will have to continue to monitor him closely.  At some point in the future I suspect he will need some sort of endoscopic or percutaneous drainage.  I am hopeful that we can hold off on that for now.  I began to start mobilizing this patient further to see if we can get him to a rehabilitation facility versus home with home health services.  I would begin to ask that our care management team to see how he can get a carbapenem for at least 3 weeks at home or at rehab.  He describes the need to get back to work to finish to boot jobs which are pending and that he really needs to finish so he is hoping for discharge sooner rather than later.  He will need an interval cholecystectomy at some point in time.  I will advance his diet tomorrow morning to try and see if he can begin to  initiate some solid foods into his diet as well as continue supplemental NG tube feedings.  I would like a C. difficile to be sent because of his increased bowel movements which I suspect is more result of his ileus improving as well as the tube feeds that he is receiving.  All questions were answered to the best of my ability.   PLAN/RECOMMENDATIONS  Work towards formalizing plan for discharge with ABx for at least a 3-week course Hold on drainage unless persistent recurrent fevers while on appropriate antibiotics CTAP in 3-weeks before clinic visit when discharged  Discharge Planning Diet: Full now Anticoagulation and antiplatelets: Per medicine Discharge Medications: Unknown Follow up: F/U with GI to be scheduled in 2-3 weeks   Please page/call with questions or concerns.   Corliss Parish, MD [facility] Office # [phone]    LOS: 10 days  Lemar Lofty  03/26/2018, 7:40 AM

## 2018-03-26 NOTE — Care Management Note (Addendum)
Case Management Note Donn PieriniKristi Alec Mcphee RN, BSN Unit 4E- RN Care Coordinator  [phone]  Patient Details  Name: Waynetta PeanCharles Dean Dorado MRN: [id number] Date of Birth: [birth year]  Subjective/Objective:    Pt admitted with gallstone pancreatitis, plan for cortrak placement, and possible lap chole- surgery following.               Action/Plan: PTA pt lived at home, CM to follow for transition of care needs.   Additional CM follow up notes:  Lawerance SabalDebbie Swist, RN 03/24/2018, 11:25 AM-- SPoke w patient at bedside. He is from home w wife. Independent PTA and walking well inpatient. Will need 3 weeks IV abx, per ID notes. Jeri ModenaPam Chandler w [facility] infusions notified and following case. PICC not in place at this time. Discussed home needs.  [facility].  NEEDS orders for IV meds and HH. [facility].   Expected Discharge Date:  03/26/18               Expected Discharge Plan:  Home w Home Health Services  In-House Referral:     Discharge planning Services  CM Consult  Post Acute Care Choice:  Home Health, Durable Medical Equipment Choice offered to:  Patient  DME Arranged:    DME Agency:  [facility]  HH Arranged:  RN, IV Antibiotics HH Agency:  [facility]  Status of Service:  Completed, signed off  If discussed at Long Length of Stay Meetings, dates discussed:    Discharge Disposition: home/home health   Additional Comments:  03/26/18- 1130- Donn PieriniKristi Graciella Arment RN, CM- pt for transition to home today- [facility] order pending for home IV abx- per previous CM note this week [facility]- referral called to Lupita [facility]ashDonna [facility] and also [facility] for home IV abx needs. Pt has PICC in place. Per [facility]onna [facility] not in service area for [facility] needs- [facility] will find agency to work with for nursing needs to work with for home IV abx needs.  Update- received word [facility] that [facility] needs to provide needed services for home IV abx.   Darrold SpanWebster, Theda Payer Hall, RN 03/26/2018, 11:36 AM

## 2018-03-29 DIAGNOSIS — K851 Biliary acute pancreatitis without necrosis or infection: Secondary | ICD-10-CM | POA: Diagnosis not present

## 2018-03-31 LAB — HCV RT-PCR, QUANT (NON-GRAPH)
HCV log10: 6.407 log10 IU/mL
Hepatitis C Quantitation: 2550000 IU/mL

## 2018-03-31 LAB — HCV AB W REFLEX TO QUANT PCR: HCV Ab: >11 s/co ratio — ABNORMAL HIGH (ref 0.0–0.9)

## 2018-04-01 ENCOUNTER — Telehealth: Payer: Self-pay | Admitting: Nurse Practitioner

## 2018-04-01 ENCOUNTER — Other Ambulatory Visit: Payer: Self-pay

## 2018-04-01 ENCOUNTER — Encounter: Payer: Self-pay | Admitting: Gastroenterology

## 2018-04-01 DIAGNOSIS — Z794 Long term (current) use of insulin: Secondary | ICD-10-CM | POA: Diagnosis not present

## 2018-04-01 DIAGNOSIS — B3789 Other sites of candidiasis: Secondary | ICD-10-CM | POA: Diagnosis not present

## 2018-04-01 DIAGNOSIS — E1165 Type 2 diabetes mellitus with hyperglycemia: Secondary | ICD-10-CM | POA: Diagnosis not present

## 2018-04-01 DIAGNOSIS — K8591 Acute pancreatitis with uninfected necrosis, unspecified: Secondary | ICD-10-CM

## 2018-04-01 NOTE — Telephone Encounter (Signed)
This patient is scheduled with Willette ClusterPaula Guenther 04/16/18. He was seen in the hospital in consult. Had a CT in the hospital. Is there anything he needs scheduled or done before the office appointment?

## 2018-04-01 NOTE — Telephone Encounter (Signed)
Thank you for reaching out [place]. I would like for him to have a CT-Abdomen Pancreas protocol done at least 1-day prior so that the results are present/available at time of his clinic visit. The CT scan should be 3-weeks from his last CT-scan. Patty can you help with arranging this for the patient. I'd like a clinic appointment at that 3-week mark. Is that possible? Ideally, I would like to see him in clinic in case we need to consider an advanced procedure and we can try and work him in but if it doesn't work with schedules, then I know Gunnar Fusiaula will do great job. Thank you.  Liz BeachGabe

## 2018-04-01 NOTE — Telephone Encounter (Signed)
I called the patient. [facility] Instructions and contrast at the front desk for him to pick up. Thanks me for my call.

## 2018-04-05 DIAGNOSIS — K851 Biliary acute pancreatitis without necrosis or infection: Secondary | ICD-10-CM | POA: Diagnosis not present

## 2018-04-05 DIAGNOSIS — B1920 Unspecified viral hepatitis C without hepatic coma: Secondary | ICD-10-CM | POA: Diagnosis not present

## 2018-04-08 ENCOUNTER — Ambulatory Visit: Payer: PPO | Admitting: Gastroenterology

## 2018-04-09 ENCOUNTER — Telehealth: Payer: Self-pay | Admitting: Gastroenterology

## 2018-04-09 DIAGNOSIS — Z792 Long term (current) use of antibiotics: Secondary | ICD-10-CM

## 2018-04-09 NOTE — Telephone Encounter (Signed)
We have been called to ask for PICC line removal. As patient is no longer on antibiotics per report, it is reasonable to have PICC removed. Order placed. He is planned for follow up in clinic in the coming weeks with CT imaging.  Corliss Parish, MD  Gastroenterology Advanced Endoscopy Office # [phone]

## 2018-04-09 NOTE — Telephone Encounter (Signed)
Dr Mansouraty please advise  

## 2018-04-13 ENCOUNTER — Ambulatory Visit (INDEPENDENT_AMBULATORY_CARE_PROVIDER_SITE_OTHER)
Admission: RE | Admit: 2018-04-13 | Discharge: 2018-04-13 | Disposition: A | Discharge status: Home / Self Care | Payer: PPO | Source: Ambulatory Visit | Attending: Gastroenterology | Admitting: Gastroenterology

## 2018-04-13 DIAGNOSIS — K8020 Calculus of gallbladder without cholecystitis without obstruction: Secondary | ICD-10-CM | POA: Diagnosis not present

## 2018-04-13 DIAGNOSIS — K8591 Acute pancreatitis with uninfected necrosis, unspecified: Secondary | ICD-10-CM

## 2018-04-13 DIAGNOSIS — K8592 Acute pancreatitis with infected necrosis, unspecified: Secondary | ICD-10-CM | POA: Diagnosis not present

## 2018-04-13 MED ORDER — IOPAMIDOL (ISOVUE-300) INJECTION 61%
100.0000 mL | Freq: Once | INTRAVENOUS | Status: AC | PRN
  Administered 2018-04-13: 100 mL via INTRAVENOUS

## 2018-04-14 ENCOUNTER — Encounter: Payer: Self-pay | Admitting: Gastroenterology

## 2018-04-16 ENCOUNTER — Ambulatory Visit (INDEPENDENT_AMBULATORY_CARE_PROVIDER_SITE_OTHER): Payer: PPO | Admitting: Nurse Practitioner

## 2018-04-16 ENCOUNTER — Encounter: Payer: Self-pay | Admitting: Nurse Practitioner

## 2018-04-16 VITALS — BP 110/60 | HR 84 | Ht 65.0 in | Wt 163.0 lb

## 2018-04-16 DIAGNOSIS — K8591 Acute pancreatitis with uninfected necrosis, unspecified: Secondary | ICD-10-CM | POA: Diagnosis not present

## 2018-04-16 DIAGNOSIS — K863 Pseudocyst of pancreas: Secondary | ICD-10-CM | POA: Diagnosis not present

## 2018-04-16 DIAGNOSIS — K862 Cyst of pancreas: Secondary | ICD-10-CM | POA: Diagnosis not present

## 2018-04-16 NOTE — Patient Instructions (Signed)
If you are age 67 or older, your body mass index should be between 23-30. Your Body mass index is 27.12 kg/m. If this is out of the aforementioned range listed, please consider follow up with your Primary Care Provider.  If you are age 67 or younger, your body mass index should be between 19-25. Your Body mass index is 27.12 kg/m. If this is out of the aformentioned range listed, please consider follow up with your Primary Care Provider.   We will call you.  Thank you for choosing me and [facility].   Willette ClusterPaula Guenther, NP

## 2018-04-16 NOTE — Progress Notes (Signed)
GI Provider:  Justice Britain, MD   Chief Complaint:    Hospital follow up for pancreatitis  IMPRESSION and PLAN:     67 yo male hospitalized several days in August with severe necrotizing pancreatitis (probable gallstone). Mild biliary duct dilation, cholelithiasis but no choledocholithiasis on MRCP.  Follow-up CT scan yesterday c/w necrotizing pancreatitis, developing pseudocysts.  -Completed outpatient antibiotics.  Patient feels well, no abdominal pain, nausea vomiting.  No fevers.  He is tolerating solid foods. -Patient will see surgery on Monday to discuss cholecystectomy.  -Will need close follow up CT scan with pancreatic protocol in a month. Plan is to follow pseudocysts with serial imaging. Would not drain at this point. Will review imaging with Dr. Rush Landmark  HPI:     Patient is a 67 year old male with a history of diabetes, hypertension, reported HCV.  He was hospitalized for several days in mid August with severe gallstone pancreatitis with necrosis.  Mild biliary duct dilation / cholelithiasis but no CBD stone on MRCP. Compared with CT scan MRCP did show worsening pancreatitis with possible hemorrhagic component / necrotizing pancreatitis. It was felt that patient probably passed a CBD stone. He was treated with jejunal feedings, IV antibiotics. Surgery followed along. Patient was discharged on 03/26/2018 with a 3-week course of Meropenem. Plan was for follow up CTAP in 3 weeks and outpatient surgery follow up to arrange for cholecystectomy.  He is here for follow-up,  had CT scan done yesterday.  He has completed IV antibiotics.  Mr. Hart feels well.  He has no abdominal pain, no nausea / vomiting.  No fevers.  Appetite is fine, taking solids. Weight is stable. He sees Surgery on Monday.   Review of systems:     No chest pain, no SOB, no fevers, no urinary sx   Past Medical History:  Diagnosis Date  . Diabetes mellitus without complication ([place])   . Essential  hypertension   . Hepatitis C     Patient's surgical history, family medical history, social history, medications and allergies were all reviewed in Epic   Creatinine clearance cannot be calculated (Patient's most recent lab result is older than the maximum 21 days allowed.)  Current Outpatient Medications  Medication Sig Dispense Refill  . meropenem (MERREM) IVPB Inject 1 g into the vein every 8 (eight) hours. Indication:  acute gallstone pancreatitis with necrosis Last Day of Therapy:  04/08/1028 Labs - Once weekly:  CBC/D and BMP, Labs - Every other week:  ESR and CRP 30 Units 0  . metoprolol succinate (TOPROL-XL) 25 MG 24 hr tablet Take 25 mg by mouth daily.  3  . nitroGLYCERIN (NITROSTAT) 0.4 MG SL tablet Place 0.4 mg under the tongue every 5 (five) minutes as needed for chest pain.   0   No current facility-administered medications for this visit.     Physical Exam:     Ht '5\' 5"'  (1.651 m)   Wt 163 lb (73.9 kg)   BMI 27.12 kg/m   GENERAL:  Pleasant male in NAD PSYCH: : Cooperative, normal affect EENT:  conjunctiva pink, mucous membranes moist, neck supple without masses CARDIAC:  RRR,  no peripheral edema PULM: Normal respiratory effort, lungs CTA bilaterally, no wheezing ABDOMEN:  Nondistended, soft, nontender. No obvious masses, no hepatomegaly,  normal bowel sounds SKIN:  turgor, no lesions seen Musculoskeletal:  Normal muscle tone, normal strength NEURO: Alert and oriented x 3, no focal neurologic deficits   Tye Savoy , NP 04/16/2018, 11:48 AM

## 2018-04-19 ENCOUNTER — Other Ambulatory Visit: Payer: Self-pay | Admitting: General Surgery

## 2018-04-19 DIAGNOSIS — K851 Biliary acute pancreatitis without necrosis or infection: Secondary | ICD-10-CM | POA: Diagnosis not present

## 2018-04-21 ENCOUNTER — Other Ambulatory Visit: Payer: Self-pay | Admitting: General Surgery

## 2018-04-21 ENCOUNTER — Encounter: Payer: Self-pay | Admitting: Nurse Practitioner

## 2018-04-21 DIAGNOSIS — K851 Biliary acute pancreatitis without necrosis or infection: Secondary | ICD-10-CM

## 2018-04-21 NOTE — Progress Notes (Signed)
Agree with assessment and plan. I have personally reviewed the cross-sectional imaging, and do agree that there is evidence of what suggestive of necrosis but due to his clinical stability without any evidence of being symptomatic and no evidence of fevers or chills I would try and monitor/support this patient as much as we can prior to considering a endoscopic or percutaneous drainage of this region. It is entirely possible that he could have new onset fevers or worsened pain that may make us feel need for more urgent intervention but I think going after this when he is asymptomatic would not be reasonable for now. Agree with need for cholecystectomy in the next few months (he is seeing surgery to discuss this in the coming week) as this will help decrease his risk of recurrent pancreatitis. I also would understand surgeries hesitance if they were to want to wait to see further resolution of this cavity. Would repeat cross-sectional CT scan with contrast pancreas protocoled first proceeding with a MRI/MRCP with contrast at an interval of 3 to 4 weeks. If continued resolution or improvement and patient remains asymptomatic we would plan to continue to monitor patient closely without need for endoscopic or percutaneous intervention.  Ryan ParishGabriel Mansouraty, MD  Gastroenterology Advanced Endoscopy Office # [phone]

## 2018-05-05 ENCOUNTER — Ambulatory Visit
Admission: RE | Admit: 2018-05-05 | Discharge: 2018-05-05 | Disposition: A | Discharge status: Home / Self Care | Payer: PPO | Source: Ambulatory Visit | Attending: General Surgery | Admitting: General Surgery

## 2018-05-05 DIAGNOSIS — K851 Biliary acute pancreatitis without necrosis or infection: Secondary | ICD-10-CM

## 2018-05-05 DIAGNOSIS — K8020 Calculus of gallbladder without cholecystitis without obstruction: Secondary | ICD-10-CM | POA: Diagnosis not present

## 2018-05-05 MED ORDER — IOPAMIDOL (ISOVUE-300) INJECTION 61%
100.0000 mL | Freq: Once | INTRAVENOUS | Status: AC | PRN
  Administered 2018-05-05: 100 mL via INTRAVENOUS

## 2018-05-07 ENCOUNTER — Emergency Department (HOSPITAL_COMMUNITY): Payer: PPO

## 2018-05-07 ENCOUNTER — Emergency Department (HOSPITAL_COMMUNITY)
Admission: EM | Admit: 2018-05-07 | Discharge: 2018-05-07 | Disposition: A | Discharge status: Home / Self Care | Payer: PPO | Attending: Emergency Medicine | Admitting: Emergency Medicine

## 2018-05-07 ENCOUNTER — Encounter (HOSPITAL_COMMUNITY): Payer: Self-pay

## 2018-05-07 DIAGNOSIS — Z794 Long term (current) use of insulin: Secondary | ICD-10-CM | POA: Insufficient documentation

## 2018-05-07 DIAGNOSIS — I700 Atherosclerosis of aorta: Secondary | ICD-10-CM

## 2018-05-07 DIAGNOSIS — E119 Type 2 diabetes mellitus without complications: Secondary | ICD-10-CM | POA: Insufficient documentation

## 2018-05-07 DIAGNOSIS — I10 Essential (primary) hypertension: Secondary | ICD-10-CM | POA: Insufficient documentation

## 2018-05-07 DIAGNOSIS — Z79899 Other long term (current) drug therapy: Secondary | ICD-10-CM | POA: Insufficient documentation

## 2018-05-07 DIAGNOSIS — K8591 Acute pancreatitis with uninfected necrosis, unspecified: Secondary | ICD-10-CM

## 2018-05-07 DIAGNOSIS — K5730 Diverticulosis of large intestine without perforation or abscess without bleeding: Secondary | ICD-10-CM | POA: Diagnosis not present

## 2018-05-07 DIAGNOSIS — F17210 Nicotine dependence, cigarettes, uncomplicated: Secondary | ICD-10-CM | POA: Insufficient documentation

## 2018-05-07 DIAGNOSIS — R0789 Other chest pain: Secondary | ICD-10-CM | POA: Diagnosis not present

## 2018-05-07 DIAGNOSIS — K8592 Acute pancreatitis with infected necrosis, unspecified: Secondary | ICD-10-CM | POA: Insufficient documentation

## 2018-05-07 DIAGNOSIS — K8020 Calculus of gallbladder without cholecystitis without obstruction: Secondary | ICD-10-CM | POA: Diagnosis not present

## 2018-05-07 DIAGNOSIS — R079 Chest pain, unspecified: Secondary | ICD-10-CM | POA: Diagnosis present

## 2018-05-07 DIAGNOSIS — K5792 Diverticulitis of intestine, part unspecified, without perforation or abscess without bleeding: Secondary | ICD-10-CM | POA: Diagnosis not present

## 2018-05-07 DIAGNOSIS — K5790 Diverticulosis of intestine, part unspecified, without perforation or abscess without bleeding: Secondary | ICD-10-CM

## 2018-05-07 LAB — HEPATIC FUNCTION PANEL
ALT: 32 U/L (ref 0–44)
AST: 29 U/L (ref 15–41)
Albumin: 3.9 g/dL (ref 3.5–5.0)
Alkaline Phosphatase: 58 U/L (ref 38–126)
BILIRUBIN TOTAL: 0.7 mg/dL (ref 0.3–1.2)
Bilirubin, Direct: 0.2 mg/dL (ref 0.0–0.2)
Indirect Bilirubin: 0.5 mg/dL (ref 0.3–0.9)
TOTAL PROTEIN: 6.3 g/dL — AB (ref 6.5–8.1)

## 2018-05-07 LAB — I-STAT TROPONIN, ED
TROPONIN I, POC: 0 ng/mL (ref 0.00–0.08)
TROPONIN I, POC: 0 ng/mL (ref 0.00–0.08)

## 2018-05-07 LAB — LIPASE, BLOOD: LIPASE: 104 U/L — AB (ref 11–51)

## 2018-05-07 LAB — BASIC METABOLIC PANEL
ANION GAP: 7 (ref 5–15)
BUN: 8 mg/dL (ref 8–23)
CO2: 28 mmol/L (ref 22–32)
Calcium: 9.3 mg/dL (ref 8.9–10.3)
Chloride: 105 mmol/L (ref 98–111)
Creatinine, Ser: 0.64 mg/dL (ref 0.61–1.24)
GFR calc Af Amer: >60 mL/min (ref >60)
GFR calc non Af Amer: >60 mL/min (ref >60)
GLUCOSE: 215 mg/dL — AB (ref 70–99)
Potassium: 4.2 mmol/L (ref 3.5–5.1)
Sodium: 140 mmol/L (ref 135–145)

## 2018-05-07 LAB — CBC
HCT: 46.5 % (ref 39.0–52.0)
HEMOGLOBIN: 15.5 g/dL (ref 13.0–17.0)
MCH: 30.9 pg (ref 26.0–34.0)
MCHC: 33.3 g/dL (ref 30.0–36.0)
MCV: 92.6 fL (ref 78.0–100.0)
Platelets: 206 10*3/uL (ref 150–400)
RBC: 5.02 MIL/uL (ref 4.22–5.81)
RDW: 13.2 % (ref 11.5–15.5)
WBC: 7.9 10*3/uL (ref 4.0–10.5)

## 2018-05-07 MED ORDER — MORPHINE SULFATE (PF) 2 MG/ML IV SOLN
2.0000 mg | Freq: Once | INTRAVENOUS | Status: AC
  Administered 2018-05-07: 2 mg via INTRAVENOUS
  Filled 2018-05-07: qty 1

## 2018-05-07 MED ORDER — IOHEXOL 300 MG/ML  SOLN
100.0000 mL | Freq: Once | INTRAMUSCULAR | Status: AC | PRN
  Administered 2018-05-07: 100 mL via INTRAVENOUS

## 2018-05-07 MED ORDER — SODIUM CHLORIDE 0.9 % IV BOLUS
1000.0000 mL | Freq: Once | INTRAVENOUS | Status: AC
  Administered 2018-05-07: 1000 mL via INTRAVENOUS

## 2018-05-07 NOTE — ED Notes (Signed)
Unable to obtain e-sign d/t equipment malfunction.  Pt verbalized understanding of d/c instructions, follow up and return precautions.  

## 2018-05-07 NOTE — ED Provider Notes (Signed)
[facility] EMERGENCY DEPARTMENT Provider Note   CSN: [id number] Arrival date & time: 05/07/18  1220     History   Chief Complaint Chief Complaint  Patient presents with  . Chest Pain    HPI Ryan Chan is a 67 y.o. male with history of cholelithiasis and necrotizing pancreatitis presenting today for abdominal pain and chest pain.  Patient states that he began having epigastric pain upon awakening this morning as well as left-sided upper chest pain.  Patient states that the pain is sharp mild intensity and constant.  Patient denies aggravating or alleviating factors for his pain.  Patient states that he has had this pain before when he was diagnosed with pancreatitis, he states that his symptoms today are less severe than his previous experience of pancreatitis.  Also endorsing lower abdominal pain that began this morning describes it as a mild throbbing pain that began shortly after the epigastric/chest pain.  Patient states that this is secondary compared to his other pain and 1/10 in intensity.  Of note patient recently diagnosed with necrotizing pancreatitis, is followed by Dr. Dwain Sarna and Dr. Rosana Fret, scheduled cholecystectomy on 05/19/2018. Patient with recent CT scan on 05/05/2018, shows stable findings and possible pseudocyst.  HPI  Past Medical History:  Diagnosis Date  . Diabetes mellitus without complication (HCC)   . Essential hypertension   . Hepatitis C   . Necrotizing pancreatitis     Patient Active Problem List   Diagnosis Date Noted  . Acute gallstone pancreatitis 03/16/2018  . Elevated LFTs 03/16/2018  . Hepatitis C 03/16/2018  . Essential hypertension 03/16/2018  . Diet-controlled diabetes mellitus (HCC) 03/16/2018    Past Surgical History:  Procedure Laterality Date  . BACK SURGERY          Home Medications    Prior to Admission medications   Medication Sig Start Date End Date Taking? Authorizing Provider    insulin glargine (LANTUS) 100 unit/mL SOPN Inject 20 Units into the skin at bedtime as needed (only if BGL is 200 or greater).  04/01/18  Yes [provider]  metoprolol succinate (TOPROL-XL) 25 MG 24 hr tablet Take 25 mg by mouth daily. 02/02/18  Yes [provider]  nitroGLYCERIN (NITROSTAT) 0.4 MG SL tablet Place 0.4 mg under the tongue every 5 (five) minutes as needed for chest pain.  01/21/18  Yes [provider]    Family History Family History  Problem Relation Age of Onset  . GI Disease Neg Hx   . Colon cancer Neg Hx   . Esophageal cancer Neg Hx   . Pancreatic cancer Neg Hx   . Stomach cancer Neg Hx   . Liver disease Neg Hx     Social History Social History   Tobacco Use  . Smoking status: Current Every Day Smoker    Packs/day: 2.00    Years: 54.00    Pack years: 108.00    Types: Cigarettes  . Smokeless tobacco: Never Used  Substance Use Topics  . Alcohol use: Not Currently  . Drug use: Not Currently     Allergies   Patient has no known allergies.   Review of Systems Review of Systems  Constitutional: Negative.  Negative for chills and fever.  HENT: Negative.  Negative for rhinorrhea and sore throat.   Eyes: Negative.  Negative for visual disturbance.  Respiratory: Negative.  Negative for cough and shortness of breath.   Cardiovascular: Positive for chest pain. Negative for leg swelling.  Gastrointestinal: Positive  for abdominal pain. Negative for blood in stool, diarrhea, nausea and vomiting.  Genitourinary: Negative.  Negative for dysuria and hematuria.  Musculoskeletal: Negative.  Negative for arthralgias and myalgias.  Skin: Negative.  Negative for rash.  Neurological: Negative.  Negative for dizziness, syncope, weakness and headaches.   Physical Exam Updated Vital Signs BP (!) 149/78   Pulse 64   Temp 98.2 F (36.8 C) (Oral)   Resp 13   SpO2 97%   Physical Exam  Constitutional: He is oriented to person, place, and time.  He appears well-developed and well-nourished. He does not appear ill. No distress.  HENT:  Head: Normocephalic and atraumatic.  Right Ear: External ear normal.  Left Ear: External ear normal.  Nose: Nose normal.  Eyes: Pupils are equal, round, and reactive to light. EOM are normal.  Neck: Trachea normal, normal range of motion, full passive range of motion without pain and phonation normal. Neck supple. No tracheal tenderness present. No tracheal deviation present.  Cardiovascular: Normal rate, regular rhythm, normal heart sounds and intact distal pulses.  Pulses:      Radial pulses are 2+ on the right side, and 2+ on the left side.       Dorsalis pedis pulses are 2+ on the right side, and 2+ on the left side.       Posterior tibial pulses are 2+ on the right side, and 2+ on the left side.  Pulmonary/Chest: Effort normal and breath sounds normal. No respiratory distress. He has no decreased breath sounds. He has no wheezes. He exhibits tenderness. He exhibits no crepitus and no deformity.    Abdominal: Soft. There is tenderness in the epigastric area. There is no rebound and no guarding.    Musculoskeletal: Normal range of motion.  Feet:  Right Foot:  Protective Sensation: 3 sites tested. 3 sites sensed.  Left Foot:  Protective Sensation: 3 sites tested. 3 sites sensed.  Neurological: He is alert and oriented to person, place, and time. GCS eye subscore is 4. GCS verbal subscore is 5. GCS motor subscore is 6.  Speech is clear and goal oriented, follows commands Major Cranial nerves without deficit, no facial droop Normal strength in upper and lower extremities bilaterally including dorsiflexion and plantar flexion, strong and equal grip strength Sensation normal to light and sharp touch Moves extremities without ataxia, coordination intact  Skin: Skin is warm and dry. Capillary refill takes less than 2 seconds.  Psychiatric: He has a normal mood and affect. His behavior is normal.    ED Treatments / Results  Labs (all labs ordered are listed, but only abnormal results are displayed) Labs Reviewed  BASIC METABOLIC PANEL - Abnormal; Notable for the following components:      Result Value   Glucose, Bld 215 (*)    All other components within normal limits  LIPASE, BLOOD - Abnormal; Notable for the following components:   Lipase 104 (*)    All other components within normal limits  HEPATIC FUNCTION PANEL - Abnormal; Notable for the following components:   Total Protein 6.3 (*)    All other components within normal limits  CBC  I-STAT TROPONIN, ED  I-STAT TROPONIN, ED    EKG EKG Interpretation  Date/Time:  Friday May 07 2018 12:31:12 EDT Ventricular Rate:  78 PR Interval:  176 QRS Duration: 94 QT Interval:  360 QTC Calculation: 410 R Axis:   -13 Text Interpretation:  Normal sinus rhythm Septal infarct , age undetermined Inferior infarct , age undetermined  Abnormal ECG tachycardia resolved compared to Aug 2019 Confirmed by Pricilla Loveless [phone]) on 05/07/2018 4:16:34 PM   Radiology Dg Chest 2 View  Result Date: 05/07/2018 CLINICAL DATA:  LEFT chest pain since this morning radiating to abdomen. History of necrotizing pancreatitis. EXAM: CHEST - 2 VIEW COMPARISON:  Chest radiograph March 17, 2018 FINDINGS: Cardiomediastinal silhouette is normal. Calcified aortic arch. No pleural effusions or focal consolidations. Punctate LEFT lung base granuloma scarring. Trachea projects midline and there is no pneumothorax. Soft tissue planes and included osseous structures are non-suspicious. Chronic fragmentation LEFT AC joint.  Retained colonic contrast. IMPRESSION: 1. No acute cardiopulmonary process. 2.  Aortic Atherosclerosis (ICD10-I70.0). Electronically Signed   By: Awilda Metro M.D.   On: 05/07/2018 17:07   Ct Abdomen Pelvis W Contrast  Result Date: 05/07/2018 CLINICAL DATA:  Right upper quadrant pain, history of recurrent gallstone pancreatitis EXAM: CT  ABDOMEN AND PELVIS WITH CONTRAST TECHNIQUE: Multidetector CT imaging of the abdomen and pelvis was performed using the standard protocol following bolus administration of intravenous contrast. CONTRAST:  OMNIPAQUE IOHEXOL 300 MG/ML  SOLN COMPARISON:  05/05/2018 FINDINGS: Lower chest: Minor basilar atelectasis versus scarring. Normal heart size. No pericardial pleural effusion. Hepatobiliary: Cholelithiasis as before. Stable mild biliary prominence without obstruction. No focal hepatic abnormality. Hepatic and portal veins are patent Pancreas: Pancreatic head continues to enhance normally. Complex thick wall peripherally enhancing fluid collection of the pancreas body and tail again noted, dominant component measures 11.3 x 4.3 cm, previously 10.4 x 4.8 cm. There is a second more inferior collection with irregularity and enhancing walls measuring 10.2 x 4.0 cm, unchanged. These are compatible with chronic pseudocyst formation. No air within the collections or hemorrhage. Residual strandy edema about the pancreas extending into splenic hilum as before. No significant change in the complicated pancreatitis. No associated vascular complication. Splenic vein remains patent. No visualized pseudoaneurysm or active bleeding. Spleen: Normal in size. Calcified granulomas noted. No definite focal abnormality. Adrenals/Urinary Tract: Normal adrenal glands. No renal obstruction or hydronephrosis. Stable 14 mm left renal cyst anteriorly in the lower pole. Ureters are symmetric and decompressed. No obstructing ureteral calculus or bladder abnormality. Stomach/Bowel: Negative for bowel obstruction, significant dilatation, ileus, or free air. Appendix is unremarkable. Scattered colonic diverticulosis. Trace pelvic free fluid as before. No other fluid collections, hemorrhage or hematoma. Vascular/Lymphatic: Atherosclerosis of the abdominal aorta and iliac vessels without occlusive disease, aneurysm or dissection. No  retroperitoneal hemorrhage or hematoma. Mesenteric and renal vasculature remain grossly patent. No adenopathy. Reproductive: Unremarkable Other: Intact abdominal wall.  No hernia. Musculoskeletal: Degenerative changes noted spine. No acute osseous finding IMPRESSION: No significant change in acute on chronic necrotizing pancreatitis with stable appearing peripancreatic fluid collections compatible with pseudocyst formation. Chronic cholelithiasis, unchanged Aortoiliac atherosclerosis Diverticulosis. Electronically Signed   By: Judie Petit.  Shick M.D.   On: 05/07/2018 18:35    Procedures Procedures (including critical care time)  Medications Ordered in ED Medications  sodium chloride 0.9 % bolus 1,000 mL (0 mLs Intravenous Stopped 05/07/18 1917)  morphine 2 MG/ML injection 2 mg (2 mg Intravenous Given 05/07/18 1709)  iohexol (OMNIPAQUE) 300 MG/ML solution 100 mL (100 mLs Intravenous Contrast Given 05/07/18 1810)     Initial Impression / Assessment and Plan / ED Course  I have reviewed the triage vital signs and the nursing notes.  Pertinent labs & imaging results that were available during my care of the patient were reviewed by me and considered in my medical decision making (see chart for details).  Troponin negative x2 LFTs nonacute CBC within normal limits BMP with elevated glucose, nonacute Lipase elevated 104 Chest x-ray nonacute EKG without acute abnormality reviewed by Dr. Criss Alvine CT abdomen pelvis without significant change, stable from previous study.  Pain controlled and fluids given in emergency department. Patient afebrile, not tachycardic, not hypotensive, well-appearing and in no acute distress. Most likely pain related to patient's known necrotic pancreatitis.  Patient is followed by Dr. Dwain Sarna with general surgery and Dr. Meridee Score with gastroenterology.  Patient with scheduled cholecystectomy on 10/19.  Patient has been encouraged to reach out to their offices for  reevaluation regarding his visit today.  Patient denying pain on reevaluation prior to discharge.  States understanding of care plan and is agreeable to discharge at this time with outpatient follow-up.  Patient does not wish to receive additional pain medicine at this time stating that he feels well.  Patient has been seen and evaluated by Dr. Criss Alvine who agrees with discharge and outpatient follow-up at this time.  At this time there does not appear to be any evidence of an acute emergency medical condition and the patient appears stable for discharge with appropriate outpatient follow up. Diagnosis was discussed with patient who verbalizes understanding of care plan and is agreeable to discharge. I have discussed return precautions with patient and wife at bedside who verbalize understanding of return precautions. Patient strongly encouraged to follow-up with their PCP and GI/Gen Surgery. All questions answered.  Note: Portions of this report may have been transcribed using voice recognition software. Every effort was made to ensure accuracy; however, inadvertent computerized transcription errors may still be present.  Final Clinical Impressions(s) / ED Diagnoses   Final diagnoses:  Necrotizing pancreatitis  Calculus of gallbladder without cholecystitis without obstruction  Atherosclerosis of aorta [facility])  Diverticulosis    ED Discharge Orders    None       Elizabeth Palau 05/07/18 2120    Pricilla Loveless, MD 05/08/18 1221

## 2018-05-07 NOTE — Discharge Instructions (Addendum)
Your work-up today was reassuring however be sure to discuss your results with your ylease return to the Emergency Department for any new or worsening symptoms or if your symptoms do not improve. Please be sure to follow up with your Primary Care Physician as soon as possible regarding your visit today. If you do not have a Primary Doctor please use the resources below to establish one. Please follow-up with your surgeon Dr. Dwain Sarna as soon as possible for evaluation regarding your visit today. Please follow-up with your GI specialist Dr. Meridee Score as soon as possible regarding your visit today. Your work-up today was reassuring.  Please be sure to follow-up with your GI and surgical specialists and return for any new or worsening symptoms or if your symptoms return.  RESOURCE GUIDE  Chronic Pain Problems: Contact [facility]  [phone] Patients need to be referred by their primary care doctor.  Insufficient Money for Medicine: Contact United Way:  call "211" or [facility] [phone].  No Primary Care Doctor: Call Health Connect  [phone] - can help you locate a primary care doctor that  accepts your insurance, provides certain services, etc. Physician Referral Service- [phone]  Agencies that provide inexpensive medical care: [facility]  [phone] [facility]  [phone] [facility]  [phone] [facility]  [phone] Planned Parenthood  [phone] [facility]  [phone]  Medicaid-accepting [facility] Providers: [facility]- [address]  [phone], Mon-Fri 9am-7pm, Sat 9am-1pm [facility]- [address] [place], [address] [place]  [phone] [facility]- [address] [place]  [phone] [facility]- [address]  [phone] Renaye Rakers- [address] [place], [address], [phone]  Only accepts  [place] patients after they have their name  applied to their card  Self Pay (no insurance) in [facility]: Sickle Cell Patients: Dr Willey Blade, [facility]  [address] [place], [phone] [facility]- [address] [place]  [phone]       [facility]- [address]       -     [facility]- see information above (Speak to [employer] if you do not have insurance)       -  [facility]- [address] [place], [phone]       -  [facility]- [address],  [phone]       -  [facility]- [address], [phone]       -  Dr Julio Sicks-  [address], [place], [phone]       -  [facility]- [address], [phone]       -  [facility]- [address] [phone], also [address], [phone]       -    [facility]- [address] [place], [phone], 1st & 3rd Saturday   every month, 10am-1pm  1) Find a Doctor and Pay Out of Pocket Although you won't have to find out who is covered by your insurance plan, it is a good idea to ask around and get recommendations. You will then need to call the office and see if the doctor you have chosen will accept you as a new patient  and what types of options they offer for patients who are self-pay. Some doctors offer discounts or will set up payment plans for their patients who do not have insurance, but you will need to ask so you aren't surprised when you get to your appointment.  2) Contact Your Local Health Department Not all health departments have doctors that can see patients for sick visits, but many do, so it is worth a call to see if yours does. If you don't know where your local health department is, you can check in your phone book. The CDC also has a tool to help you locate your state's health department, and many state websites also have listings of all of their local  health departments.  3) Find a Walk-in Clinic If your illness is not likely to be very severe or complicated, you may want to try a walk in clinic. These are popping up all over the country in pharmacies, drugstores, and shopping centers. They're usually staffed by nurse practitioners or physician assistants that have been trained to treat common illnesses and complaints. They're usually fairly quick and inexpensive. However, if you have serious medical issues or chronic medical problems, these are probably not your best option  STD Testing [facility] Department of [facility], STD Clinic, [address] [place], phone [phone] or [phone].  Monday - Friday, call for an appointment. [facility] Department of [employer], STD Clinic, [address], [place], phone [phone] or [phone].  Monday - Friday, call for an appointment.  Abuse/Neglect: [facility] Child Abuse Hotline [phone] [facility] Child Abuse Hotline 352-442-3754 (After Hours)  Emergency Shelter:  [facility] 949-012-7862  Maternity Homes: Room at the [place] [facility] [phone] [facility] 8504692309  MRSA Hotline #:   (774)316-4941  [facility] Resources  [facility]  United Way [facility] Dept. [address]                 [address]         [address] [place] [address]  Blondell Reveal Phone:  [phone]                                  Phone:  [phone]                   Phone:  [phone]  [facility], [phone] [facility] [place]- [phone]       -     [facility] in [place], [address]                                  [phone], [facility] Child Abuse Hotline 331-347-8152 or 709-725-9222 (After Hours)   Behavioral Health Services  Substance Abuse  Resources: Alcohol and Drug Services  (936) 442-4011 [facility] [phone] [facility] [place] [phone] Floydene Flock [phone] Residential & Outpatient Substance Abuse Program  8137767123  Psychological Services: [facility]  [phone] [facility]  ([phone] [facility], [phone] [place]. [address] [place], ACCESS LINE: 224-032-6791 or ([phone], [employer]  Dental Assistance  If unable to pay or uninsured, contact:  [facility] or [facility]. to become qualified for the adult dental clinic.  Patients with Medicaid: [facility] [phone] [address], [phone] [address] [phone]  If unable to pay, or uninsured, contact [facility] 9724081726) or [facility] Department [phone] in [place], [phone] in [facility]) to become qualified for the adult dental clinic   Other Low-Cost Community Dental Services: [facility]- [address] [place], [place], [place], [address], [phone], Ext. [address], 2nd and 4th Thursday of the month at 6:30am.  10 clients each day by appointment, can sometimes see walk-in patients if someone does not show for an appointment. [facility]- [address] [place], [place], [address], [phone] [facility] [address], [place], [place], [address], [phone] [facility] Health Department- [phone] [facility] Health Department- [phone] [facility] [phone]

## 2018-05-07 NOTE — ED Triage Notes (Signed)
Pt presents with onset of L upper chest pain that began after awakening this morning.  Pt reports pain has been constant, radiates into L chest with sore area to epigastric region.  Pt reports R sided abdominal pain that began later that he reports is similar to his pancreatitis.  Pt is scheduled 10/16 for gall bladder removal.

## 2018-05-07 NOTE — ED Notes (Signed)
Results reviewed.  No changes in acuity at this time 

## 2018-05-10 ENCOUNTER — Encounter: Payer: Self-pay | Admitting: Gastroenterology

## 2018-05-10 ENCOUNTER — Ambulatory Visit (INDEPENDENT_AMBULATORY_CARE_PROVIDER_SITE_OTHER): Payer: PPO | Admitting: Gastroenterology

## 2018-05-10 VITALS — BP 138/78 | HR 68 | Ht 65.0 in | Wt 165.4 lb

## 2018-05-10 DIAGNOSIS — K8689 Other specified diseases of pancreas: Secondary | ICD-10-CM | POA: Diagnosis not present

## 2018-05-10 DIAGNOSIS — Z8719 Personal history of other diseases of the digestive system: Secondary | ICD-10-CM | POA: Diagnosis not present

## 2018-05-10 DIAGNOSIS — K851 Biliary acute pancreatitis without necrosis or infection: Secondary | ICD-10-CM

## 2018-05-10 DIAGNOSIS — K862 Cyst of pancreas: Secondary | ICD-10-CM | POA: Diagnosis not present

## 2018-05-10 NOTE — Patient Instructions (Signed)
We will call to schedule your EUS with Dr. Meridee Score

## 2018-05-10 NOTE — Pre-Procedure Instructions (Signed)
Ryan Chan  05/10/2018      [id number] Barbaraann Cao, [place] - [address] AT [facility] [address] [place] [address] Phone: [phone] Fax: [phone]    Your procedure is scheduled on Wednesday October 16.  Report to [facility] Admitting at 6:30 A.M.  Call this number if you have problems the morning of surgery:  [phone]   Remember:  Do not eat or drink after midnight.  You may drink clear liquids until 5:30 AM .  Clear liquids allowed are: Water, Carbonated beverages, Black Coffee only and Gatorade    Take these medicines the morning of surgery with A SIP OF WATER: Metoprolol (Toprol-XL)  TAKE HALF DOSE of lantus insulin the night before surgery (10 units)  7 days prior to surgery STOP taking any Aspirin(unless otherwise instructed by your surgeon), Aleve, Naproxen, Ibuprofen, Motrin, Advil, Goody's, BC's, all herbal medications, fish oil, and all vitamins     How to Manage Your Diabetes Before and After Surgery  Why is it important to control my blood sugar before and after surgery? . Improving blood sugar levels before and after surgery helps healing and can limit problems. . A way of improving blood sugar control is eating a healthy diet by: o  Eating less sugar and carbohydrates o  Increasing activity/exercise o  Talking with your doctor about reaching your blood sugar goals . High blood sugars (greater than 180 mg/dL) can raise your risk of infections and slow your recovery, so you will need to focus on controlling your diabetes during the weeks before surgery. . Make sure that the doctor who takes care of your diabetes knows about your planned surgery including the date and location.  How do I manage my blood sugar before surgery? . Check your blood sugar at least 4 times a day, starting 2 days before surgery, to make sure that the level is not too high or low. o Check your blood sugar the  morning of your surgery when you wake up and every 2 hours until you get to the Short Stay unit. . If your blood sugar is less than 70 mg/dL, you will need to treat for low blood sugar: o Do not take insulin. o Treat a low blood sugar (less than 70 mg/dL) with  cup of clear juice (cranberry or apple), 4 glucose tablets, OR glucose gel. Recheck blood sugar in 15 minutes after treatment (to make sure it is greater than 70 mg/dL). If your blood sugar is not greater than 70 mg/dL on recheck, call [phone] o  for further instructions. . Report your blood sugar to the short stay nurse when you get to Short Stay.  . If you are admitted to the hospital after surgery: o Your blood sugar will be checked by the staff and you will probably be given insulin after surgery (instead of oral diabetes medicines) to make sure you have good blood sugar levels. o The goal for blood sugar control after surgery is 80-180 mg/dL.                Do not wear jewelry, make-up or nail polish.  Do not wear lotions, powders, or perfumes, or deodorant.  Do not shave 48 hours prior to surgery.  Men may shave face and neck.  Do not bring valuables to the hospital.  [facility] is not responsible for any belongings or valuables.  Contacts, dentures or bridgework may  not be worn into surgery.  Leave your suitcase in the car.  After surgery it may be brought to your room.  For patients admitted to the hospital, discharge time will be determined by your treatment team.  Patients discharged the day of surgery will not be allowed to drive home.   Special instructions:    [facility]- Preparing For Surgery  Before surgery, you can play an important role. Because skin is not sterile, your skin needs to be as free of germs as possible. You can reduce the number of germs on your skin by washing with CHG (chlorahexidine gluconate) Soap before surgery.  CHG is an antiseptic cleaner which kills germs and bonds with the  skin to continue killing germs even after washing.    Oral Hygiene is also important to reduce your risk of infection.  Remember - BRUSH YOUR TEETH THE MORNING OF SURGERY WITH YOUR REGULAR TOOTHPASTE  Please do not use if you have an allergy to CHG or antibacterial soaps. If your skin becomes reddened/irritated stop using the CHG.  Do not shave (including legs and underarms) for at least 48 hours prior to first CHG shower. It is OK to shave your face.  Please follow these instructions carefully.   1. Shower the NIGHT BEFORE SURGERY and the MORNING OF SURGERY with CHG.   2. If you chose to wash your hair, wash your hair first as usual with your normal shampoo.  3. After you shampoo, rinse your hair and body thoroughly to remove the shampoo.  4. Use CHG as you would any other liquid soap. You can apply CHG directly to the skin and wash gently with a scrungie or a clean washcloth.   5. Apply the CHG Soap to your body ONLY FROM THE NECK DOWN.  Do not use on open wounds or open sores. Avoid contact with your eyes, ears, mouth and genitals (private parts). Wash Face and genitals (private parts)  with your normal soap.  6. Wash thoroughly, paying special attention to the area where your surgery will be performed.  7. Thoroughly rinse your body with warm water from the neck down.  8. DO NOT shower/wash with your normal soap after using and rinsing off the CHG Soap.  9. Pat yourself dry with a CLEAN TOWEL.  10. Wear CLEAN PAJAMAS to bed the night before surgery, wear comfortable clothes the morning of surgery  11. Place CLEAN SHEETS on your bed the night of your first shower and DO NOT SLEEP WITH PETS.    Day of Surgery:  Do not apply any deodorants/lotions.  Please wear clean clothes to the hospital/surgery center.   Remember to brush your teeth WITH YOUR REGULAR TOOTHPASTE.    Please read over the following fact sheets that you were given. Coughing and Deep Breathing and Surgical  Site Infection Prevention

## 2018-05-10 NOTE — Progress Notes (Signed)
[facility] VISIT   Primary Care Provider Myrlene Broker, MD [place] [address] [phone]  Referring Provider Myrlene Broker, MD [place],  [address] [phone]  Patient Profile: Ryan Chan is a 67 y.o. male with a pmh significant for recent necrotizing gallstone pancreatitis complicated by formation of walled off necrosis/pseudocysts, hypertension, HCV infection, diabetes.  The patient presents to the [facility] for an evaluation and management of problem(s) noted below:  Problem List 1. History of acute pancreatitis   2. Gallstone pancreatitis   3. Sterile pancreatic necrosis   4. Pancreatic cyst     History of Present Illness: This is a patient whom I met back in August in the hospital in the setting of severe necrotizing gallstone pancreatitis.  He initially presented with concern for biliary obstruction based on abnormal liver biochemical testing however liver tests improved an MRI/MRCP showed no evidence of stone burden.  He remained in the hospital the course the next 2 weeks and efforts of nutritional optimization and required antibiotics to be initiated because of concern for necrosis.  He completed 3 weeks worth of IV antibiotics and eventually was able to be discharged home and completed his antibiotics.  In the middle of September he followed up in clinic with [place] seen by my colleague PA Chester Holstein where he was noting that he was feeling extremely well and actually had an upcoming surgical appointment to discuss cholecystectomy.  He had undergone a CT abdomen pelvis with contrast was performed before his clinic visit in September which identified necrotizing pancreatitis and portions of the pancreatic tail with some parenchymal enhancement.  The head and uncinate process were enhancing normally.  There was a finding peripancreatic fluid collection that was  approximately 4.3 x 11.5 cm with another ill-defined extension of fluid inferiorly near the pancreatic uncinate 3.5 x 2.3 cm in size.  After being seen in clinic by the surgeons in early October a follow-up CT abdomen pelvis was formed.  At this CT abdomen pelvis on 2 September 2 October showed a 10.4 x 4.8 cm fluid collection as well as a communication to a more inferior fluid collection that was 10.0 x 4.0 cm.  Message was sent by between Dr. Donne Hazel and myself to consider the role of evaluating the patient prior to a planned cholecystectomy in the next couple of weeks.  On Friday the patient developed a change in his discomfort such that he was having left shoulder pain with associated lower abdominal pressure (he has had this pressure in the setting of needing to have a bowel movement).  However as the discomfort lasted and he was concerned about the potential of having recurrent pancreatitis came in for further evaluation.  Upon his evaluation here he had a slight elevation in his lipase to the 100 range with liver test not showing significant abnormalities and he subsequently received a single dose of pain medication and was able to be discharged.  He underwent a CT which showed no significant changes from that which was done just 2 days prior and there was no evidence of a pseudoaneurysm or active bleeding.  The patient describes no significant recurrence of discomfort and he is having weight gain.  Overall, he does have abdominal discomfort in his lower abdomen which is relieved when he has a bowel movement.  He has very minimal nausea and does not vomit.  He gets full slightly faster than normal  but feels this continues to improve since his discharge.  GI Review of Systems Positive as above slight change in appetite Negative for dysphasia, odynophagia, melena, hematochezia  Review of Systems General: Denies fevers/chills HEENT: Denies oral lesions Cardiovascular: Had left-sided chest pain and  shoulder pain as noted above but not currently Pulmonary: Denies shortness of breath Gastroenterological: See HPI Genitourinary: Denies darkened urine Hematological: Denies easy bruising Dermatological: Denies jaundice Psychological: Mood is stable though anxious about wanting to get his gallbladder removed as soon as he can Musculoskeletal: Denies new arthralgias   Medications Current Outpatient Medications  Medication Sig Dispense Refill  . insulin glargine (LANTUS) 100 unit/mL SOPN Inject 20 Units into the skin at bedtime as needed (only if BGL is 200 or greater).     . metoprolol succinate (TOPROL-XL) 25 MG 24 hr tablet Take 25 mg by mouth daily.  3  . nitroGLYCERIN (NITROSTAT) 0.4 MG SL tablet Place 0.4 mg under the tongue every 5 (five) minutes as needed for chest pain.   0   No current facility-administered medications for this visit.     Allergies No Known Allergies  Histories Past Medical History:  Diagnosis Date  . Diabetes mellitus without complication ([place])   . Essential hypertension   . Hepatitis C   . Necrotizing pancreatitis    Past Surgical History:  Procedure Laterality Date  . BACK SURGERY     Social History   Socioeconomic History  . Marital status: Married    Spouse name: Not on file  . Number of children: Not on file  . Years of education: Not on file  . Highest education level: Not on file  Occupational History  . Occupation: Armed forces operational officer work  Social Needs  . Financial resource strain: Not on file  . Food insecurity:    Worry: Not on file    Inability: Not on file  . Transportation needs:    Medical: Not on file    Non-medical: Not on file  Tobacco Use  . Smoking status: Current Every Day Smoker    Packs/day: 2.00    Years: 54.00    Pack years: 108.00    Types: Cigarettes  . Smokeless tobacco: Never Used  Substance and Sexual Activity  . Alcohol use: Not Currently  . Drug use: Not Currently  . Sexual activity: Not on file    Lifestyle  . Physical activity:    Days per week: Not on file    Minutes per session: Not on file  . Stress: Not on file  Relationships  . Social connections:    Talks on phone: Not on file    Gets together: Not on file    Attends religious service: Not on file    Active member of club or organization: Not on file    Attends meetings of clubs or organizations: Not on file    Relationship status: Not on file  . Intimate partner violence:    Fear of current or ex partner: Not on file    Emotionally abused: Not on file    Physically abused: Not on file    Forced sexual activity: Not on file  Other Topics Concern  . Not on file  Social History Narrative  . Not on file   Family History  Problem Relation Age of Onset  . GI Disease Neg Hx   . Colon cancer Neg Hx   . Esophageal cancer Neg Hx   . Pancreatic cancer Neg Hx   . Stomach  cancer Neg Hx   . Liver disease Neg Hx   . Inflammatory bowel disease Neg Hx   . Rectal cancer Neg Hx    I have reviewed his medical, social, and family history in detail and updated the electronic medical record as necessary.    PHYSICAL EXAMINATION  BP 138/78   Pulse 68   Ht _0  (1.651 m)   Wt 165 lb 6.4 oz (75 kg)   SpO2 96%   BMI 27.52 kg/m   Wt Readings from Last 3 Encounters:  05/10/18 165 lb 6.4 oz (75 kg)  04/16/18 163 lb (73.9 kg)  03/26/18 161 lb 9.6 oz (73.3 kg)  GEN: NAD, appears stated age, doesn't appear chronically ill, accompanied by wife PSYCH: Cooperative, without pressured speech EYE: Conjunctivae pink, sclerae anicteric ENT: MMM, without oral ulcers NECK: Supple CV: RR without R/Gs  RESP: CTAB posteriorly, without wheezing GI: NABS, soft, protuberant lower abdomen, tenderness to palpation (deeply) and midepigastrium and subxiphoid region, without rebound or guarding MSK/EXT: Trace bilateral lower extremity edema SKIN: No jaundice NEURO:  Alert & Oriented x 3, no focal deficits   REVIEW OF DATA  I reviewed the  following data at the time of this encounter:  GI Procedures and Studies  None to review  Laboratory Studies  Reviewed in epic  Imaging Studies  05/07/18 CTAP with contrast IMPRESSION: No significant change in acute on chronic necrotizing pancreatitis with stable appearing peripancreatic fluid collections compatible with pseudocyst formation.   ASSESSMENT  Mr. Mano is a 67 y.o. male with a pmh significant for recent necrotizing gallstone pancreatitis complicated by formation of walled off necrosis/pseudocysts, hypertension, HCV infection, diabetes.  The patient is seen today for evaluation and management of:  1. History of acute pancreatitis   2. Gallstone pancreatitis   3. Sterile pancreatic necrosis   4. Pancreatic cyst    Patient is hemodynamically stable upon evaluation today.  However he has 2 very large fluid collections surrounding his body tail region of his pancreas.  At this point he is seeming to be largely asymptomatic however there does seem to be some slight early satiety as well as some nausea that occurs and although he describes abdominal pain that he thinks is associated when he has a bowel movement with improvement this could be signs of some clinical significance before the patient.  He is been seen by surgery and it is felt that cholecystectomy should be approved for which we agree with however the timing of this is the current issue.  I have personally reviewed all his imaging since his hospital stay and I do not overtly see pseudoaneurysm as has also not been noted on any of his CT imaging.  I think that he has the possibility for a window for cyst gastrostomy creation.  The question is how aggressive we should be and with the patient's recent bout of discomfort although lipase only was minimally elevated not to 3 times the upper limit of normal but with need for him to have a surgery sooner rather than later query of trying to move forward with cyst gastrostomy  creation comes about.  To undergo a laparoscopic cholecystectomy I suspect may be difficult with how significant his inflammation is and the possibility of needing an open cholecystectomy and thus potentially disturbing pseudocyst exists.  I will discuss case with Dr. Donne Hazel from surgery and we will determine the potential need for moving forward with cyst gastrostomy creation versus continued monitoring.  I am favoring due  to the size of the 2 fluid collections and there size is not really grossly changing over the course of over a month time that I think drainage is going to be necessary.  We will determine the potential timing of procedures after our discussion.  The risks of EUS including bleeding, infection, aspiration pneumonia and intestinal perforation were discussed.  When we perform a cyst gastrostomy there is an additional risk of pancreatitis at the rate of about 1-2%.  it was explained that procedure related pancreatitis is typically mild, although can be severe and even life threatening.  We discussed the use of AXIOS LAMS stenting and that it remains in place for anywhere between 2 and 8 weeks and is removed thereafter.  If the patient were to be found to have significant pancreatic necrosis that we may need to consider necrosectomy.  All patient questions were answered, to the best of my ability, and the patient agrees to the aforementioned plan of action with follow-up as indicated.   PLAN  1. History of acute pancreatitis  2. Gallstone pancreatitis -Will eventually need cholecystectomy  3. Sterile pancreatic necrosis -We will discuss with Dr. Donne Hazel from surgery about consideration of cyst gastrostomy creation in effort to try and optimize timing for patient's cholecystectomy and decrease risk of recurrent pancreatitis as well as to treat patient's symptoms - Patient and wife are aware of the risks of our procedure but are willing to proceed if we feel that this is the best next  step  4. Pancreatic cyst   No orders of the defined types were placed in this encounter.   New Prescriptions   No medications on file   Modified Medications   No medications on file    Planned Follow Up: No follow-ups on file.   Justice Britain, MD [facility] Office # [phone]

## 2018-05-11 ENCOUNTER — Encounter (HOSPITAL_COMMUNITY)
Admission: RE | Admit: 2018-05-11 | Discharge: 2018-05-11 | Disposition: A | Discharge status: Home / Self Care | Payer: PPO | Source: Ambulatory Visit | Attending: General Surgery | Admitting: General Surgery

## 2018-05-11 ENCOUNTER — Encounter (HOSPITAL_COMMUNITY): Payer: Self-pay

## 2018-05-11 ENCOUNTER — Other Ambulatory Visit: Payer: Self-pay

## 2018-05-11 ENCOUNTER — Encounter: Payer: Self-pay | Admitting: Gastroenterology

## 2018-05-11 DIAGNOSIS — K8689 Other specified diseases of pancreas: Secondary | ICD-10-CM | POA: Insufficient documentation

## 2018-05-11 DIAGNOSIS — K862 Cyst of pancreas: Secondary | ICD-10-CM | POA: Insufficient documentation

## 2018-05-11 DIAGNOSIS — Z01812 Encounter for preprocedural laboratory examination: Secondary | ICD-10-CM | POA: Insufficient documentation

## 2018-05-11 DIAGNOSIS — Z8719 Personal history of other diseases of the digestive system: Secondary | ICD-10-CM | POA: Insufficient documentation

## 2018-05-11 DIAGNOSIS — E119 Type 2 diabetes mellitus without complications: Secondary | ICD-10-CM | POA: Diagnosis not present

## 2018-05-11 DIAGNOSIS — K851 Biliary acute pancreatitis without necrosis or infection: Secondary | ICD-10-CM | POA: Insufficient documentation

## 2018-05-11 DIAGNOSIS — I10 Essential (primary) hypertension: Secondary | ICD-10-CM | POA: Diagnosis not present

## 2018-05-11 DIAGNOSIS — K863 Pseudocyst of pancreas: Secondary | ICD-10-CM | POA: Insufficient documentation

## 2018-05-11 HISTORY — DX: Gastro-esophageal reflux disease without esophagitis: K21.9

## 2018-05-11 HISTORY — DX: Personal history of urinary calculi: Z87.442

## 2018-05-11 LAB — CBC WITH DIFFERENTIAL/PLATELET
Abs Immature Granulocytes: 0.02 10*3/uL (ref 0.00–0.07)
Basophils Absolute: 0.1 10*3/uL (ref 0.0–0.1)
Basophils Relative: 1 %
EOS PCT: 6 %
Eosinophils Absolute: 0.3 10*3/uL (ref 0.0–0.5)
HCT: 46 % (ref 39.0–52.0)
HEMOGLOBIN: 15 g/dL (ref 13.0–17.0)
Immature Granulocytes: 0 %
Lymphocytes Relative: 22 %
Lymphs Abs: 1.3 10*3/uL (ref 0.7–4.0)
MCH: 30.4 pg (ref 26.0–34.0)
MCHC: 32.6 g/dL (ref 30.0–36.0)
MCV: 93.1 fL (ref 80.0–100.0)
MONO ABS: 0.4 10*3/uL (ref 0.1–1.0)
Monocytes Relative: 7 %
Neutro Abs: 3.7 10*3/uL (ref 1.7–7.7)
Neutrophils Relative %: 64 %
Platelets: 188 10*3/uL (ref 150–400)
RBC: 4.94 MIL/uL (ref 4.22–5.81)
RDW: 13.2 % (ref 11.5–15.5)
WBC: 5.7 10*3/uL (ref 4.0–10.5)
nRBC: 0 % (ref 0.0–0.2)

## 2018-05-11 LAB — PROTIME-INR
INR: 1.13
PROTHROMBIN TIME: 14.4 s (ref 11.4–15.2)

## 2018-05-11 LAB — GLUCOSE, CAPILLARY: Glucose-Capillary: 205 mg/dL — ABNORMAL HIGH (ref 70–99)

## 2018-05-11 LAB — HEMOGLOBIN A1C
Hgb A1c MFr Bld: 7 % — ABNORMAL HIGH (ref 4.8–5.6)
MEAN PLASMA GLUCOSE: 154.2 mg/dL

## 2018-05-11 NOTE — Progress Notes (Signed)
PCP - Keturah Barre (care everywhere) Cardiologist - Dr. Almyra Deforest (care everywhere)  Chest x-ray - 05/07/18 EKG - 05/08/18 Stress Test - requested (see Dr. McGurkin's note in care everywhere) ECHO - denies Cardiac Cath - denies  Fasting Blood Sugar - usually around 190 per patient right now d/t pancreatitis A1c checked today  Anesthesia review: pt with recent ED visits for chest pain. Per pt this is d/t his pancreatitis. Follow up stress test records.   Patient denies shortness of breath, fever, cough and chest pain at PAT appointment   Patient verbalized understanding of instructions that were given to them at the PAT appointment. Patient was also instructed that they will need to review over the PAT instructions again at home before surgery.

## 2018-05-12 NOTE — Progress Notes (Signed)
Anesthesia Chart Review:  Case:  [id number] Date/Time:  05/19/18 0815   Procedure:  LAPAROSCOPIC CHOLECYSTECTOMY WITH INTRAOPERATIVE CHOLANGIOGRAM (N/A )   Anesthesia type:  General   Pre-op diagnosis:  GALLSTONES   Location:  [facility] OR ROOM 02 / [facility] OR   Surgeon:  Emelia Loron, MD      DISCUSSION: 66 yo male current smoker. Pertinent hx includes DMII, HTN, Hep C, Necrotizing pancreatitis, GERD.  Pt recently admitted 8/13-8/23/2019 for acute necrotizing gallstone pancreatitis. He has pancreatic fluid collections that are being followed by Dr. Meridee Score and per his notes he has been in discussion with Dr. Marolyn Haller regarding management of these.  Per notes in care everywhere pt was seen by Cardiology, Dr. Ginger Carne, 02/02/2018 for followup of recent Admission to [facility] on 01/21/18 for chest pain. While hospitalized he had nuclear stress test and echo. Per Dr. Georgiann Cocker notes:  "The nuclear stress test was read as "no reversible defect identified however there are 2 small fixed defects/scar at the apex involving the anterior septal wall and the inferior wall/inferior septal wall. EF was 56%". He also had an echocardiogram which shows his EF is 65% there was mild aortic regurgitation and mild mitral regurgitation and mild tricuspid regurgitation. The RVSP was measured at only 20 mmHg."  Given pt's risk factors, cardiac cath was discussed but pt wanted to pursue medical therapy first with the understanding that if pain returned he would like require cath. Dr. Desma Maxim recommended continue ASA and lisinopril with additional of Toprol-XL and f/u in 1 month. It does not appear pt has yet followed up, likely due at least in part to attention being turned to pancreatitis and need for cholecystectomy.  Recent admission for chest pain, however echo and stress test were mostly reassuring. He does have significant risk factors but is on appropriate medical therapy by Dr. Desma Maxim. Anticipate he can  proceed as planned barring acute status change.  VS: BP (!) 149/81   Pulse 76   Temp 36.6 C (Oral)   Resp 16   Ht 5\' 5"  (1.651 m)   Wt 75.1 kg   SpO2 99%   BMI 27.56 kg/m   PROVIDERS: Hadley Pen, MD is PCP  Ginger Carne, MD is Cardiologist  Corliss Parish, MD is Gastroenterologst  LABS: Labs reviewed: Acceptable for surgery. (all labs ordered are listed, but only abnormal results are displayed)  Labs Reviewed  GLUCOSE, CAPILLARY - Abnormal; Notable for the following components:      Result Value   Glucose-Capillary 205 (*)    All other components within normal limits  HEMOGLOBIN A1C - Abnormal; Notable for the following components:   Hgb A1c MFr Bld 7.0 (*)    All other components within normal limits  CBC WITH DIFFERENTIAL/PLATELET  PROTIME-INR     IMAGES: CT Abd/pelvis 05/07/2018: IMPRESSION: No significant change in acute on chronic necrotizing pancreatitis with stable appearing peripancreatic fluid collections compatible with pseudocyst formation.  Chronic cholelithiasis, unchanged  Aortoiliac atherosclerosis  Diverticulosis.  CHEST - 2 VIEW 05/07/2018  COMPARISON:  Chest radiograph March 17, 2018  FINDINGS: Cardiomediastinal silhouette is normal. Calcified aortic arch. No pleural effusions or focal consolidations. Punctate LEFT lung base granuloma scarring. Trachea projects midline and there is no pneumothorax. Soft tissue planes and included osseous structures are non-suspicious.  Chronic fragmentation LEFT AC joint.  Retained colonic contrast.  IMPRESSION: 1. No acute cardiopulmonary process. 2.  Aortic Atherosclerosis (ICD10-I70.0).   EKG: 05/07/2018: Normal sinus rhythm. Septal infarct , age undetermined. Inferior  infarct , age undetermined  CV: TTE 01/20/18 (outside record, copy on pt chart): Conclusions: 1.  Sinus rhythm. 2.  Left ventricular wall thickness is normal. 3.  Overall left ventricular systolic  function is normal with an EF between 60-65%. 4.  The diastolic filling pattern indicates impaired relaxation. 5.  There is mild aortic valve sclerosis. 6.  There is trace to mild aortic regurgitation. 7.  There is no evidence of aortic stenosis. 8.  Normal-appearing mitral valve. 9.  There is trace to mild mitral regurgitation. 10.  Mild tricuspid regurgitation present.  Exercise Cardiolite 01/21/18 (outside record, copy on pt chart): Summary and conclusions: Intermediate risk due to baseline ST-T downsloping changes diffuse which are nonspecific but due to persisting changes in baseline changes intermediate risk equivocal findings await radiology to assess SPECT study nuclear study for comprehensive risk further stratification, good functional capacity, no significant arrhythmia, up to 9 METS of functional activity.  Myocardial imaging with SPECT imaging 01/21/18 (outside record, copy on pt chart): Findings: Perfusion: No reversible perfusion defect identified.  2 small fixed defect/scar, including the anterior septal wall of the apex and the inferior septal wall/inferior wall of the apex.  Wall motion: Normal left ventricular wall motion.  No left ventricular dilation.  Left ventricular ejection fraction: 56%.  End-diastolic volume 104 mL  End-systolic volume 45 mL  Impression: 1.  No reversible defect identified, however, there are 2 small fixed defect/scar in the apex, involving the anterior septal wall and the inferior wall/inferior septal wall. 2.  Normal left ventricular wall motion. 3.  Left ventricular ejection fraction 56%. 4.  Noninvasive risk stratification: Low  Past Medical History:  Diagnosis Date  . Diabetes mellitus without complication (HCC)   . Essential hypertension   . GERD (gastroesophageal reflux disease)   . Hepatitis C   . History of kidney stones   . Necrotizing pancreatitis     Past Surgical History:  Procedure Laterality Date  . BACK SURGERY       MEDICATIONS: . insulin glargine (LANTUS) 100 unit/mL SOPN  . metoprolol succinate (TOPROL-XL) 25 MG 24 hr tablet  . nitroGLYCERIN (NITROSTAT) 0.4 MG SL tablet   No current facility-administered medications for this encounter.     Zannie Cove [facility] Short Stay Center/Anesthesiology Phone [phone] 05/12/2018 1:10 PM

## 2018-05-13 ENCOUNTER — Telehealth: Payer: Self-pay

## 2018-05-13 NOTE — Telephone Encounter (Signed)
-----   Message from Lemar Lofty., MD sent at 05/13/2018 10:59 AM EDT ----- Ryan Chan, We are going to wait for now. He is going to get gallbladder out first from my last discussion with surgery. I'd like him to be back to see me in 4-6 weeks with CT scan being done after to follow up the necrosis/cystic region.  If still present will then definitely need EUS. Thanks. Gabe ----- Message ----- From: Loretha Stapler, RN Sent: 05/13/2018  10:37 AM EDT To: Lemar Lofty., MD  Dr Meridee Score do you want this pt scheduled for EUS? If so, can you send me the reason and how soon you will need it.  Thanks  ----- Message ----- From: Jeanine Luz, CMA Sent: 05/13/2018  10:24 AM EDT To: Loretha Stapler, RN  I meant to send you this earlier and completely forgot.  I was with Mansouraty Monday and he saw this patient regarding an EUS but wanted to wait and have you schedule it when you got back. Marland Kitchen

## 2018-05-14 ENCOUNTER — Ambulatory Visit: Payer: PPO | Admitting: Gastroenterology

## 2018-05-14 NOTE — Telephone Encounter (Signed)
Appt made with Dr Meridee Score for 11/15 at 9 am.  Letter mailed

## 2018-05-19 ENCOUNTER — Ambulatory Visit (HOSPITAL_COMMUNITY): Payer: PPO | Admitting: Certified Registered"

## 2018-05-19 ENCOUNTER — Ambulatory Visit (HOSPITAL_COMMUNITY)
Admission: RE | Admit: 2018-05-19 | Discharge: 2018-05-19 | Disposition: A | Discharge status: Home / Self Care | Payer: PPO | Source: Ambulatory Visit | Attending: General Surgery | Admitting: General Surgery

## 2018-05-19 ENCOUNTER — Encounter (HOSPITAL_COMMUNITY)
Admission: RE | Disposition: A | Discharge status: Home / Self Care | Payer: Self-pay | Source: Ambulatory Visit | Attending: General Surgery

## 2018-05-19 ENCOUNTER — Ambulatory Visit (HOSPITAL_COMMUNITY): Payer: PPO | Admitting: Physician Assistant

## 2018-05-19 ENCOUNTER — Encounter (HOSPITAL_COMMUNITY): Payer: Self-pay | Admitting: *Deleted

## 2018-05-19 ENCOUNTER — Telehealth: Payer: Self-pay

## 2018-05-19 DIAGNOSIS — K851 Biliary acute pancreatitis without necrosis or infection: Secondary | ICD-10-CM | POA: Diagnosis not present

## 2018-05-19 DIAGNOSIS — F17200 Nicotine dependence, unspecified, uncomplicated: Secondary | ICD-10-CM | POA: Diagnosis not present

## 2018-05-19 DIAGNOSIS — I10 Essential (primary) hypertension: Secondary | ICD-10-CM | POA: Insufficient documentation

## 2018-05-19 DIAGNOSIS — K859 Acute pancreatitis without necrosis or infection, unspecified: Secondary | ICD-10-CM

## 2018-05-19 DIAGNOSIS — K8010 Calculus of gallbladder with chronic cholecystitis without obstruction: Secondary | ICD-10-CM | POA: Insufficient documentation

## 2018-05-19 DIAGNOSIS — E109 Type 1 diabetes mellitus without complications: Secondary | ICD-10-CM | POA: Insufficient documentation

## 2018-05-19 DIAGNOSIS — K8080 Other cholelithiasis without obstruction: Secondary | ICD-10-CM | POA: Diagnosis present

## 2018-05-19 DIAGNOSIS — Z794 Long term (current) use of insulin: Secondary | ICD-10-CM | POA: Diagnosis not present

## 2018-05-19 DIAGNOSIS — K429 Umbilical hernia without obstruction or gangrene: Secondary | ICD-10-CM | POA: Diagnosis not present

## 2018-05-19 HISTORY — PX: CHOLECYSTECTOMY: SHX55

## 2018-05-19 LAB — GLUCOSE, CAPILLARY
Glucose-Capillary: 314 mg/dL — ABNORMAL HIGH (ref 70–99)
Glucose-Capillary: 145 mg/dL — ABNORMAL HIGH (ref 70–99)

## 2018-05-19 SURGERY — LAPAROSCOPIC CHOLECYSTECTOMY WITH INTRAOPERATIVE CHOLANGIOGRAM
Anesthesia: General | Site: Abdomen

## 2018-05-19 MED ORDER — LACTATED RINGERS IV SOLN
INTRAVENOUS | Status: DC | PRN
  Administered 2018-05-19 (×2): via INTRAVENOUS

## 2018-05-19 MED ORDER — SUFENTANIL CITRATE 50 MCG/ML IV SOLN
INTRAVENOUS | Status: DC | PRN
  Administered 2018-05-19 (×3): 10 ug via INTRAVENOUS

## 2018-05-19 MED ORDER — SODIUM CHLORIDE 0.9% FLUSH: 3.0000 mL | Freq: Two times a day (BID) | INTRAVENOUS | Status: DC

## 2018-05-19 MED ORDER — GABAPENTIN 100 MG PO CAPS
100.0000 mg | ORAL_CAPSULE | ORAL | Status: AC
  Administered 2018-05-19: 100 mg via ORAL
  Filled 2018-05-19: qty 1

## 2018-05-19 MED ORDER — METOCLOPRAMIDE HCL 5 MG/ML IJ SOLN
INTRAMUSCULAR | Status: AC
  Filled 2018-05-19: qty 2

## 2018-05-19 MED ORDER — ONDANSETRON HCL 4 MG/2ML IJ SOLN
INTRAMUSCULAR | Status: DC | PRN
  Administered 2018-05-19: 4 mg via INTRAVENOUS

## 2018-05-19 MED ORDER — ACETAMINOPHEN 650 MG RE SUPP: 650.0000 mg | RECTAL | Status: DC | PRN

## 2018-05-19 MED ORDER — BUPIVACAINE-EPINEPHRINE 0.25% -1:200000 IJ SOLN
INTRAMUSCULAR | Status: DC | PRN
  Administered 2018-05-19: 10 mL

## 2018-05-19 MED ORDER — CIPROFLOXACIN IN D5W 400 MG/200ML IV SOLN
400.0000 mg | INTRAVENOUS | Status: AC
  Administered 2018-05-19: 400 mg via INTRAVENOUS
  Filled 2018-05-19: qty 200

## 2018-05-19 MED ORDER — ROCURONIUM 10MG/ML (10ML) SYRINGE FOR MEDFUSION PUMP - OPTIME
INTRAVENOUS | Status: DC | PRN
  Administered 2018-05-19: 50 mg via INTRAVENOUS

## 2018-05-19 MED ORDER — SUGAMMADEX SODIUM 200 MG/2ML IV SOLN
INTRAVENOUS | Status: DC | PRN
  Administered 2018-05-19: 200 mg via INTRAVENOUS

## 2018-05-19 MED ORDER — FENTANYL CITRATE (PF) 100 MCG/2ML IJ SOLN
25.0000 ug | INTRAMUSCULAR | Status: DC | PRN
  Administered 2018-05-19 (×2): 50 ug via INTRAVENOUS

## 2018-05-19 MED ORDER — OXYCODONE HCL 5 MG PO TABS: 5.0000 mg | ORAL_TABLET | Freq: Four times a day (QID) | ORAL | 0 refills | Status: DC | PRN

## 2018-05-19 MED ORDER — LIDOCAINE HCL (CARDIAC) PF 100 MG/5ML IV SOSY
PREFILLED_SYRINGE | INTRAVENOUS | Status: DC | PRN
  Administered 2018-05-19: 100 mg via INTRAVENOUS

## 2018-05-19 MED ORDER — LABETALOL HCL 5 MG/ML IV SOLN
INTRAVENOUS | Status: AC
  Filled 2018-05-19: qty 4

## 2018-05-19 MED ORDER — FENTANYL CITRATE (PF) 100 MCG/2ML IJ SOLN
INTRAMUSCULAR | Status: AC
  Filled 2018-05-19: qty 2

## 2018-05-19 MED ORDER — MORPHINE SULFATE (PF) 2 MG/ML IV SOLN: 2.0000 mg | INTRAVENOUS | Status: DC | PRN

## 2018-05-19 MED ORDER — BUPIVACAINE-EPINEPHRINE (PF) 0.25% -1:200000 IJ SOLN
INTRAMUSCULAR | Status: AC
  Filled 2018-05-19: qty 30

## 2018-05-19 MED ORDER — LABETALOL HCL 5 MG/ML IV SOLN
INTRAVENOUS | Status: DC | PRN
  Administered 2018-05-19 (×3): 5 mg via INTRAVENOUS

## 2018-05-19 MED ORDER — LIDOCAINE 2% (20 MG/ML) 5 ML SYRINGE
INTRAMUSCULAR | Status: AC
  Filled 2018-05-19: qty 5

## 2018-05-19 MED ORDER — 0.9 % SODIUM CHLORIDE (POUR BTL) OPTIME
TOPICAL | Status: DC | PRN
  Administered 2018-05-19: 1000 mL

## 2018-05-19 MED ORDER — SUFENTANIL CITRATE 50 MCG/ML IV SOLN
INTRAVENOUS | Status: AC
  Filled 2018-05-19: qty 1

## 2018-05-19 MED ORDER — ENSURE PRE-SURGERY PO LIQD: 296.0000 mL | Freq: Once | ORAL | Status: DC

## 2018-05-19 MED ORDER — METOCLOPRAMIDE HCL 5 MG/ML IJ SOLN
10.0000 mg | Freq: Once | INTRAMUSCULAR | Status: AC | PRN
  Administered 2018-05-19: 10 mg via INTRAVENOUS

## 2018-05-19 MED ORDER — INSULIN ASPART 100 UNIT/ML ~~LOC~~ SOLN
5.0000 [IU] | Freq: Once | SUBCUTANEOUS | Status: AC
  Administered 2018-05-19: 5 [IU] via SUBCUTANEOUS

## 2018-05-19 MED ORDER — SODIUM CHLORIDE 0.9% FLUSH: 3.0000 mL | INTRAVENOUS | Status: DC | PRN

## 2018-05-19 MED ORDER — MIDAZOLAM HCL 5 MG/5ML IJ SOLN
INTRAMUSCULAR | Status: DC | PRN
  Administered 2018-05-19: 2 mg via INTRAVENOUS

## 2018-05-19 MED ORDER — SODIUM CHLORIDE 0.9 % IJ SOLN
INTRAMUSCULAR | Status: AC
  Filled 2018-05-19: qty 10

## 2018-05-19 MED ORDER — SODIUM CHLORIDE 0.9 % IV SOLN: INTRAVENOUS | Status: DC

## 2018-05-19 MED ORDER — PROPOFOL 10 MG/ML IV BOLUS
INTRAVENOUS | Status: DC | PRN
  Administered 2018-05-19: 160 mg via INTRAVENOUS

## 2018-05-19 MED ORDER — INSULIN ASPART 100 UNIT/ML ~~LOC~~ SOLN
SUBCUTANEOUS | Status: AC
  Filled 2018-05-19: qty 1

## 2018-05-19 MED ORDER — PROPOFOL 10 MG/ML IV BOLUS
INTRAVENOUS | Status: AC
  Filled 2018-05-19: qty 20

## 2018-05-19 MED ORDER — SODIUM CHLORIDE 0.9 % IR SOLN
Status: DC | PRN
  Administered 2018-05-19: 1

## 2018-05-19 MED ORDER — OXYCODONE HCL 5 MG PO TABS
ORAL_TABLET | ORAL | Status: AC
  Filled 2018-05-19: qty 2

## 2018-05-19 MED ORDER — OXYCODONE HCL 5 MG PO TABS
5.0000 mg | ORAL_TABLET | ORAL | Status: DC | PRN
  Administered 2018-05-19: 10 mg via ORAL

## 2018-05-19 MED ORDER — MEPERIDINE HCL 50 MG/ML IJ SOLN: 6.2500 mg | INTRAMUSCULAR | Status: DC | PRN

## 2018-05-19 MED ORDER — HEMOSTATIC AGENTS (NO CHARGE) OPTIME
TOPICAL | Status: DC | PRN
  Administered 2018-05-19: 1 via TOPICAL

## 2018-05-19 MED ORDER — MIDAZOLAM HCL 2 MG/2ML IJ SOLN
INTRAMUSCULAR | Status: AC
  Filled 2018-05-19: qty 2

## 2018-05-19 MED ORDER — ACETAMINOPHEN 325 MG PO TABS: 650.0000 mg | ORAL_TABLET | ORAL | Status: DC | PRN

## 2018-05-19 MED ORDER — ACETAMINOPHEN 500 MG PO TABS
1000.0000 mg | ORAL_TABLET | ORAL | Status: AC
  Administered 2018-05-19: 1000 mg via ORAL
  Filled 2018-05-19: qty 2

## 2018-05-19 MED ORDER — SODIUM CHLORIDE 0.9 % IV SOLN: 250.0000 mL | INTRAVENOUS | Status: DC | PRN

## 2018-05-19 MED ORDER — ROCURONIUM BROMIDE 50 MG/5ML IV SOSY
PREFILLED_SYRINGE | INTRAVENOUS | Status: AC
  Filled 2018-05-19: qty 5

## 2018-05-19 MED ORDER — ONDANSETRON HCL 4 MG/2ML IJ SOLN
INTRAMUSCULAR | Status: AC
  Filled 2018-05-19: qty 2

## 2018-05-19 MED ORDER — IOPAMIDOL (ISOVUE-300) INJECTION 61%
INTRAVENOUS | Status: AC
  Filled 2018-05-19: qty 50

## 2018-05-19 MED ORDER — HEPARIN SODIUM (PORCINE) 5000 UNIT/ML IJ SOLN
5000.0000 [IU] | Freq: Once | INTRAMUSCULAR | Status: AC
  Administered 2018-05-19: 5000 [IU] via SUBCUTANEOUS
  Filled 2018-05-19: qty 1

## 2018-05-19 SURGICAL SUPPLY — 57 items
APPLIER CLIP 5 13 M/L LIGAMAX5 (MISCELLANEOUS) ×2
BLADE CLIPPER SURG (BLADE) ×2 IMPLANT
CANISTER SUCT 3000ML PPV (MISCELLANEOUS) ×2 IMPLANT
CHLORAPREP W/TINT 26ML (MISCELLANEOUS) ×2 IMPLANT
CLIP APPLIE 5 13 M/L LIGAMAX5 (MISCELLANEOUS) ×1 IMPLANT
COVER SURGICAL LIGHT HANDLE (MISCELLANEOUS) ×2 IMPLANT
COVER WAND RF STERILE (DRAPES) ×2 IMPLANT
DERMABOND ADVANCED (GAUZE/BANDAGES/DRESSINGS) ×1
DERMABOND ADVANCED .7 DNX12 (GAUZE/BANDAGES/DRESSINGS) ×1 IMPLANT
ELECT CAUTERY BLADE 6.4 (BLADE) ×2 IMPLANT
ELECT REM PT RETURN 9FT ADLT (ELECTROSURGICAL) ×2
ELECTRODE REM PT RTRN 9FT ADLT (ELECTROSURGICAL) ×1 IMPLANT
ENDOLOOP SUT PDS II  0 18 (SUTURE) ×1
ENDOLOOP SUT PDS II 0 18 (SUTURE) ×1 IMPLANT
GLOVE BIO SURGEON STRL SZ 6.5 (GLOVE) ×2 IMPLANT
GLOVE BIO SURGEON STRL SZ7 (GLOVE) ×2 IMPLANT
GLOVE BIOGEL PI IND STRL 6.5 (GLOVE) ×1 IMPLANT
GLOVE BIOGEL PI IND STRL 7.0 (GLOVE) ×1 IMPLANT
GLOVE BIOGEL PI IND STRL 7.5 (GLOVE) ×1 IMPLANT
GLOVE BIOGEL PI IND STRL 8 (GLOVE) ×1 IMPLANT
GLOVE BIOGEL PI INDICATOR 6.5 (GLOVE) ×1
GLOVE BIOGEL PI INDICATOR 7.0 (GLOVE) ×1
GLOVE BIOGEL PI INDICATOR 7.5 (GLOVE) ×1
GLOVE BIOGEL PI INDICATOR 8 (GLOVE) ×1
GLOVE SS BIOGEL STRL SZ 7 (GLOVE) ×1 IMPLANT
GLOVE SUPERSENSE BIOGEL SZ 7 (GLOVE) ×1
GLOVE SURG SS PI 6.0 STRL IVOR (GLOVE) ×2 IMPLANT
GLOVE SURG SS PI 6.5 STRL IVOR (GLOVE) ×2 IMPLANT
GLOVE SURG SS PI 8.0 STRL IVOR (GLOVE) ×2 IMPLANT
GOWN STRL REUS W/ TWL LRG LVL3 (GOWN DISPOSABLE) ×4 IMPLANT
GOWN STRL REUS W/TWL LRG LVL3 (GOWN DISPOSABLE) ×4
GRASPER SUT TROCAR 14GX15 (MISCELLANEOUS) ×2 IMPLANT
HEMOSTAT SNOW SURGICEL 2X4 (HEMOSTASIS) ×2 IMPLANT
KIT BASIN OR (CUSTOM PROCEDURE TRAY) ×2 IMPLANT
KIT TURNOVER KIT B (KITS) ×2 IMPLANT
NS IRRIG 1000ML POUR BTL (IV SOLUTION) ×2 IMPLANT
PAD ARMBOARD 7.5X6 YLW CONV (MISCELLANEOUS) ×2 IMPLANT
PENCIL BUTTON HOLSTER BLD 10FT (ELECTRODE) ×2 IMPLANT
POUCH RETRIEVAL ECOSAC 10 (ENDOMECHANICALS) ×1 IMPLANT
POUCH RETRIEVAL ECOSAC 10MM (ENDOMECHANICALS) ×1
SCISSORS LAP 5X35 DISP (ENDOMECHANICALS) ×2 IMPLANT
SET CHOLANGIOGRAPH 5 50 .035 (SET/KITS/TRAYS/PACK) ×2 IMPLANT
SET IRRIG TUBING LAPAROSCOPIC (IRRIGATION / IRRIGATOR) ×2 IMPLANT
SLEEVE ENDOPATH XCEL 5M (ENDOMECHANICALS) ×4 IMPLANT
SPECIMEN JAR SMALL (MISCELLANEOUS) ×2 IMPLANT
STRIP CLOSURE SKIN 1/2X4 (GAUZE/BANDAGES/DRESSINGS) ×2 IMPLANT
SUT MNCRL AB 4-0 PS2 18 (SUTURE) ×4 IMPLANT
SUT NOVA NAB DX-16 0-1 5-0 T12 (SUTURE) ×2 IMPLANT
SUT VIC AB 3-0 SH 8-18 (SUTURE) ×2 IMPLANT
SUT VICRYL 0 UR6 27IN ABS (SUTURE) ×2 IMPLANT
TOWEL OR 17X24 6PK STRL BLUE (TOWEL DISPOSABLE) ×2 IMPLANT
TOWEL OR 17X26 10 PK STRL BLUE (TOWEL DISPOSABLE) ×2 IMPLANT
TRAY LAPAROSCOPIC MC (CUSTOM PROCEDURE TRAY) ×2 IMPLANT
TROCAR XCEL BLUNT TIP 100MML (ENDOMECHANICALS) ×2 IMPLANT
TROCAR XCEL NON-BLD 5MMX100MML (ENDOMECHANICALS) ×2 IMPLANT
TUBING INSUFFLATION (TUBING) ×2 IMPLANT
WATER STERILE IRR 1000ML POUR (IV SOLUTION) ×2 IMPLANT

## 2018-05-19 NOTE — Op Note (Addendum)
Preoperative diagnosis gallstone pancreatitis Postoperative diagnosis: saa, chronic cholecystitis Procedure: Laparoscopic cholecystectomy, primary umbilical hernia repair Surgeon: Dr. Harden Mo Anesthesia: General Estimated blood loss: <25 cc Complications: None Drains: none Specimens gallbladder to pathology Sponge needle count was correct at completion Decision to recovery in stable condition  Indications: This is 88 yom with recent necrotizing pancreatitis from gallstones.  He has resolved this except for an asymptomatic pseudocyst and I discussed proceeding with a  Laparoscopic cholecystectomy.   Procedure: After informed consent was obtained the patient was taken to the operating room.  He was given Zosyn.  SCDs were in place.  He was then placed under general anesthesia without complication.  He was prepped and draped in the standard sterile surgical fashion.  Surgical timeout was then performed.  I infiltrated marcaine below the umbilicus and then made a curvilinear incision.  I then divided the umbilical stalk. He had an 8 mm umbilical hernia.   I then placed a 0 Vicryl pursestring suture through the fascia.  I inserted a Hassan trocar and insufflated the abdomen to 15 mmHg pressure. I then placed 3 additional 5 mm trocars in the ruq and epigastrium under direct vision without complication.   I was then able to retract the gallbladder cephalad and lateral. His gallbladder had evidence of chronic cholecystitis with a thick wall and a very large stone.  I was able eventually to obtain the critical view of safety. The cystic duct was large indicative of prior stone passage.  I elected not to do a cholangiogram. I clipped the duct and divided it. I did place an endoloop on the duct as well. I then treated the artery in a similar fashion.  The gallbladder was then removed from the liver bed and placed in a retrieval bag.  I removed this from the umbilicus. I had to make this incision  bigger just to remove the specimen. I then irrigated and obtained hemostasis.  I did place a piece of surgicel snow in the liver bed.  I then removed the hasson trocar and tied down the pursestring.  I then repaired the hernia with 4 #1 novafil sutures. I then removed the remaining trocars and desufflated the abdomen.  I sutured the umbilicus down with 3-0 vicryl. All incisions were then closed with 4-0 monocryl and glue. He tolerated well and was transferred to pacu stable.

## 2018-05-19 NOTE — H&P (Signed)
67 yom admitted recently for gallstone related necrotizing pancreatitis. CT on 8/19 showed cholelithiasis and continued necrotizing pancreatitis. he was on iv abx for several weeks. he was recently seen in follow with gi and had repeat ct scan on 9/10 that showed evolving pancreatitis with collections and resolution of ascites. he has gallstones. he feels some upper abdominal pain occasionally he thinks more likely result of not having nl bms. otherwise he is mostly back to normal  Past Surgical History  No pertinent past surgical history   Diagnostic Studies History Colonoscopy  never  Allergies  No Known Drug Allergies  Allergies Reconciled   Medication History Metoprolol Succinate ER (25MG  Tablet ER 24HR, Oral) Active. Nitroglycerin (0.4MG  Tab Sublingual, Sublingual) Active. Medications Reconciled  Social History  Alcohol use  Remotely quit alcohol use. Tobacco use  Current every day smoker.  Family History Colon Polyps  Brother. Heart Disease  Father. Heart disease in male family member before age 22   Other Problems  Back Pain  Cholelithiasis  Diabetes Mellitus  Gastroesophageal Reflux Disease  Hepatitis  High blood pressure  Kidney Stone  Pancreatitis    Review of Systems  General Not Present- Appetite Loss, Chills, Fatigue, Fever, Night Sweats, Weight Gain and Weight Loss. Skin Not Present- Change in Wart/Mole, Dryness, Hives, Jaundice, New Lesions, Non-Healing Wounds, Rash and Ulcer. HEENT Present- Wears glasses/contact lenses. Not Present- Earache, Hearing Loss, Hoarseness, Nose Bleed, Oral Ulcers, Ringing in the Ears, Seasonal Allergies, Sinus Pain, Sore Throat, Visual Disturbances and Yellow Eyes. Gastrointestinal Not Present- Abdominal Pain, Bloating, Bloody Stool, Change in Bowel Habits, Chronic diarrhea, Constipation, Difficulty Swallowing, Excessive gas, Gets full quickly at meals, Hemorrhoids, Indigestion, Nausea, Rectal Pain and  Vomiting. Male Genitourinary Not Present- Blood in Urine, Change in Urinary Stream, Frequency, Impotence, Nocturia, Painful Urination, Urgency and Urine Leakage. Musculoskeletal Present- Back Pain. Not Present- Joint Pain, Joint Stiffness, Muscle Pain, Muscle Weakness and Swelling of Extremities. Neurological Not Present- Decreased Memory, Fainting, Headaches, Numbness, Seizures, Tingling, Tremor, Trouble walking and Weakness. Psychiatric Not Present- Anxiety, Bipolar, Change in Sleep Pattern, Depression, Fearful and Frequent crying. Endocrine Not Present- Cold Intolerance, Excessive Hunger, Hair Changes, Heat Intolerance, Hot flashes and New Diabetes. Hematology Not Present- Blood Thinners, Easy Bruising, Excessive bleeding, Gland problems, HIV and Persistent Infections.  Vitals  Weight: 162.6 lb Height: 65in Body Surface Area: 1.81 m Body Mass Index: 27.06 kg/m  Pain Level: 0/10 Temp.: 97.75F(Temporal)  Pulse: 101 (Regular)  P.OX: 97% (Room air) BP: 130/76 (Sitting, Left Arm, Standard) Physical Exam  General Mental Status-Alert. Eye Sclera/Conjunctiva - Bilateral-No scleral icterus. Chest and Lung Exam Chest and lung exam reveals -quiet, even and easy respiratory effort with no use of accessory muscles. Cardiovascular Cardiovascular examination reveals -normal heart sounds, regular rate and rhythm with no murmurs. Abdomen Note: soft nontender nondistended uh present and reducible Neurologic Neurologic evaluation reveals -alert and oriented x 3 with no impairment of recent or remote memory.   Assessment & Plan  GALLSTONE PANCREATITIS (K85.10) Story: lap chole  I discussed the procedure in detail. The patient was given Agricultural engineer. We discussed the risks and benefits of a laparoscopic cholecystectomy and possible cholangiogram including, but not limited to bleeding, infection, injury to surrounding structures such as the intestine or liver, bile  leak, retained gallstones, need to convert to an open procedure, prolonged diarrhea, blood clots such as DVT, common bile duct injury, anesthesia risks, and possible need for additional procedures. The likelihood of improvement in symptoms and return to the patient's normal  status is good. We discussed the typical post-operative recovery course.

## 2018-05-19 NOTE — Anesthesia Preprocedure Evaluation (Signed)
Anesthesia Evaluation  Patient identified by MRN, date of birth, ID band Patient awake    Reviewed: Allergy & Precautions, NPO status , Patient's Chart, lab work & pertinent test results  Airway Mallampati: II  TM Distance: >3 FB Neck ROM: Full    Dental no notable dental hx.    Pulmonary Current Smoker,    Pulmonary exam normal breath sounds clear to auscultation       Cardiovascular hypertension, Pt. on medications Normal cardiovascular exam Rhythm:Regular Rate:Normal     Neuro/Psych negative neurological ROS  negative psych ROS   GI/Hepatic negative GI ROS, (+) Hepatitis -, C  Endo/Other  diabetes, Type 1, Insulin Dependent  Renal/GU negative Renal ROS  negative genitourinary   Musculoskeletal negative musculoskeletal ROS (+)   Abdominal   Peds negative pediatric ROS (+)  Hematology negative hematology ROS (+)   Anesthesia Other Findings   Reproductive/Obstetrics negative OB ROS                             Anesthesia Physical Anesthesia Plan  ASA: II  Anesthesia Plan: General   Post-op Pain Management:    Induction: Intravenous  PONV Risk Score and Plan: 1 and Ondansetron and Treatment may vary due to age or medical condition  Airway Management Planned: Oral ETT  Additional Equipment:   Intra-op Plan:   Post-operative Plan: Extubation in OR  Informed Consent: I have reviewed the patients History and Physical, chart, labs and discussed the procedure including the risks, benefits and alternatives for the proposed anesthesia with the patient or authorized representative who has indicated his/her understanding and acceptance.   Dental advisory given  Plan Discussed with: CRNA  Anesthesia Plan Comments:         Anesthesia Quick Evaluation

## 2018-05-19 NOTE — Progress Notes (Signed)
Patient states that his blood sugar was in the 130's prior to drinking Ensure pre-surgery.  Patient drank ensure and on arrival to short stay, blood sugar was 314.  Dr. Acey Lav notified and received verbal order to give 5 units of Novolog.

## 2018-05-19 NOTE — Interval H&P Note (Signed)
History and Physical Interval Note:  05/19/2018 8:00 AM  Ryan Chan  has presented today for surgery, with the diagnosis of GALLSTONES  The various methods of treatment have been discussed with the patient and family. After consideration of risks, benefits and other options for treatment, the patient has consented to  Procedure(s): LAPAROSCOPIC CHOLECYSTECTOMY WITH INTRAOPERATIVE CHOLANGIOGRAM (N/A) as a surgical intervention .  The patient's history has been reviewed, patient examined, no change in status, stable for surgery.  I have reviewed the patient's chart and labs.  Questions were answered to the patient's satisfaction.     Emelia Loron

## 2018-05-19 NOTE — Progress Notes (Signed)
Notified Dr. Chilton Si of pt's blood sugar,deferred to Dr. Tiffany Kocher. Dr. Tiffany Kocher paged, phone busy.

## 2018-05-19 NOTE — Anesthesia Procedure Notes (Signed)
Procedure Name: Intubation Date/Time: 05/19/2018 8:38 AM Performed by: Claris Che, CRNA Pre-anesthesia Checklist: Patient identified, Emergency Drugs available, Suction available, Patient being monitored and Timeout performed Patient Re-evaluated:Patient Re-evaluated prior to induction Oxygen Delivery Method: Circle system utilized Preoxygenation: Pre-oxygenation with 100% oxygen Induction Type: IV induction Ventilation: Mask ventilation without difficulty Laryngoscope Size: Mac and 3 Grade View: Grade I Tube type: Oral Tube size: 7.5 mm Number of attempts: 1 Airway Equipment and Method: Stylet Placement Confirmation: ETT inserted through vocal cords under direct vision,  positive ETCO2 and breath sounds checked- equal and bilateral Secured at: 24 cm Tube secured with: Tape Dental Injury: Teeth and Oropharynx as per pre-operative assessment

## 2018-05-19 NOTE — Transfer of Care (Signed)
Immediate Anesthesia Transfer of Care Note  Patient: Ryan Chan  Procedure(s) Performed: LAPAROSCOPIC CHOLECYSTECTOMY WITH INTRAOPERATIVE CHOLANGIOGRAM (N/A Abdomen)  Patient Location: PACU  Anesthesia Type:General  Level of Consciousness: oriented, sedated, drowsy, patient cooperative and responds to stimulation  Airway & Oxygen Therapy: Patient Spontanous Breathing and Patient connected to nasal cannula oxygen  Post-op Assessment: Report given to RN, Post -op Vital signs reviewed and stable and Patient moving all extremities X 4  Post vital signs: Reviewed and stable  Last Vitals:  Vitals Value Taken Time  BP 192/93 05/19/2018 10:13 AM  Temp    Pulse 73 05/19/2018 10:14 AM  Resp 13 05/19/2018 10:14 AM  SpO2 99 % 05/19/2018 10:14 AM  Vitals shown include unvalidated device data.  Last Pain:  Vitals:   05/19/18 0731  TempSrc:   PainSc: 0-No pain         Complications: No apparent anesthesia complications

## 2018-05-19 NOTE — Telephone Encounter (Signed)
Ryan Chan,  I'd like to have a CT Abdomen with contrast done before my clinic visit so that we can review how things look at that point in time in November.  Thanks.  [name]

## 2018-05-19 NOTE — Anesthesia Postprocedure Evaluation (Signed)
Anesthesia Post Note  Patient: Ryan Chan  Procedure(s) Performed: LAPAROSCOPIC CHOLECYSTECTOMY WITH INTRAOPERATIVE CHOLANGIOGRAM (N/A Abdomen)     Patient location during evaluation: PACU Anesthesia Type: General Level of consciousness: awake and alert Pain management: pain level controlled Vital Signs Assessment: post-procedure vital signs reviewed and stable Respiratory status: spontaneous breathing, nonlabored ventilation, respiratory function stable and patient connected to nasal cannula oxygen Cardiovascular status: blood pressure returned to baseline and stable Postop Assessment: no apparent nausea or vomiting Anesthetic complications: no    Last Vitals:  Vitals:   05/19/18 1044 05/19/18 1055  BP: (!) 177/97 (!) 186/99  Pulse: 70 71  Resp: 11 15  Temp:    SpO2: 97% 97%    Last Pain:  Vitals:   05/19/18 1055  TempSrc:   PainSc: 4                  Phillips Grout

## 2018-05-19 NOTE — Discharge Instructions (Signed)
[facility] LAPAROSCOP[facility] SURG[facility]: POST OP INSTRUCTIONS  Always review your discharge instruction sheet given to you by the facility where your surgery was performed. IF YOU HAVE DISABILITY OR FAMILY LEAVE FORMS, YOU MUST BRING THEM TO THE OFFICE FOR PROCESSING.   DO NOT GIVE THEM TO YOUR DOCTOR.  1. A prescription for pain medication may be given to you upon discharge.  Take your pain medication as prescribed, if needed.  If narcotic pain medicine is not needed, then you may take acetaminophen (Tylenol), naprosyn (Alleve), or ibuprofen (Advil) as needed. 2. Take your usually prescribed medications unless otherwise directed. 3. If you need a refill on your pain medication, please contact your pharmacy.  They will contact our office to request authorization. Prescriptions will not be filled after 5pm or on week-ends. 4. You should follow a light diet the first few days after arrival home, such as soup and crackers, etc.  Be sure to include lots of fluids daily. 5. Most patients will experience some swelling and bruising in the area of the incisions.  Ice packs will help.  Swelling and bruising can take several days to resolve.  6. It is common to experience some constipation if taking pain medication after surgery.  Increasing fluid intake and taking a stool softener (such as Colace) will usually help or prevent this problem from occurring.  A mild laxative (Milk of Magnesia or Miralax) should be taken according to package instructions if there are no bowel movements after 48 hours. 7. Unless discharge instructions indicate otherwise, you may remove your bandages 48 hours after surgery, and you may shower at that time.  You may have steri-strips (small skin tapes) in place directly over the incision.  These strips should be left on the skin for 7-10 days.  If your surgeon used skin glue on the incision, you may shower in 24 hours.  The glue will flake  off over the next 2-3 weeks.  Any sutures or staples will be removed at the office during your follow-up visit. 8. ACTIVITIES:  You may resume regular (light) daily activities beginning the next day--such as daily self-care, walking, climbing stairs--gradually increasing activities as tolerated.  You may have sexual intercourse when it is comfortable.  Refrain from any heavy lifting or straining until approved by your doctor. a. You may drive when you are no longer taking prescription pain medication, you can comfortably wear a seatbelt, and you can safely maneuver your car and apply brakes. b. RETURN TO WORK:  __________________________________________________________ 9. You should see your doctor in the office for a follow-up appointment approximately 2-3 weeks after your surgery.  Make sure that you call for this appointment within a day or two after you arrive home to insure a convenient appointment time. 10. OTHER INSTRUCTIONS: __________________________________________________________________________________________________________________________ __________________________________________________________________________________________________________________________ WHEN TO CALL YOUR DOCTOR: 1. Fever over 101.0 2. Inability to urinate 3. Continued bleeding from incision. 4. Increased pain, redness, or drainage from the incision. 5. Increasing abdominal pain  The clinic staff is available to answer your questions during regular business hours.  Please don't hesitate to call and ask to speak to one of the nurses for clinical concerns.  If you have a medical emergency, go to the nearest emergency room or call 911.  A surgeon from [facility] is always on call at the hospital. [address], [address], [place], [place]  [address] ? [address] [address], [place], [place]   [address] [phone] ? [phone] ? [phone] Web site: [email or web address]  

## 2018-05-20 ENCOUNTER — Encounter (HOSPITAL_COMMUNITY): Payer: Self-pay | Admitting: General Surgery

## 2018-05-20 NOTE — Telephone Encounter (Signed)
You have been scheduled for a CT scan of the abdomen and pelvis at [facility] ([address]---this is in the same building as Press [facility]).   You are scheduled on 06/03/18 at 130. You should arrive 15 minutes prior to your appointment time for registration. Please follow the written instructions below on the day of your exam:  WARNING: IF YOU ARE ALLERGIC TO IODINE/X-RAY DYE, PLEASE NOTIFY RADIOLOGY IMMEDIATELY AT [phone]! YOU WILL BE GIVEN A 13 HOUR PREMEDICATION PREP.  1) Do not eat or drink anything after 930 am (4 hours prior to your test) 2) You have been given 2 bottles of oral contrast to drink. The solution may taste better if refrigerated, but do NOT add ice or any other liquid to this solution. Shake well before drinking.    Drink 1 bottle of contrast @ 1230 pm (1 hours prior to your exam)   You may take any medications as prescribed with a small amount of water, if necessary. If you take any of the following medications: METFORMIN, GLUCOPHAGE, GLUCOVANCE, AVANDAMET, RIOMET, FORTAMET, [place] MET, JANUMET, GLUMETZA or METAGLIP, you MAY be asked to HOLD this medication 48 hours AFTER the exam.  The purpose of you drinking the oral contrast is to aid in the visualization of your intestinal tract. The contrast solution may cause some diarrhea. Depending on your individual set of symptoms, you may also receive an intravenous injection of x-ray contrast/dye. Plan on being at [facility] for 30 minutes or longer, depending on the type of exam you are having performed.  This test typically takes 30-45 minutes to complete.  If you have any questions regarding your exam or if you need to reschedule, you may call the CT department at [phone] between the hours of 8:00 am and 5:00 pm, Monday-Friday.  ____________________________________________

## 2018-05-20 NOTE — Telephone Encounter (Signed)
The patient has been notified of this information and all questions answered. The pt has been advised of the information and verbalized understanding.    

## 2018-06-03 ENCOUNTER — Ambulatory Visit (INDEPENDENT_AMBULATORY_CARE_PROVIDER_SITE_OTHER)
Admission: RE | Admit: 2018-06-03 | Discharge: 2018-06-03 | Disposition: A | Discharge status: Home / Self Care | Payer: PPO | Source: Ambulatory Visit | Attending: Gastroenterology | Admitting: Gastroenterology

## 2018-06-03 DIAGNOSIS — K859 Acute pancreatitis without necrosis or infection, unspecified: Secondary | ICD-10-CM

## 2018-06-03 MED ORDER — IOPAMIDOL (ISOVUE-300) INJECTION 61%
100.0000 mL | Freq: Once | INTRAVENOUS | Status: AC | PRN
  Administered 2018-06-03: 100 mL via INTRAVENOUS

## 2018-06-18 ENCOUNTER — Encounter: Payer: Self-pay | Admitting: Gastroenterology

## 2018-06-18 ENCOUNTER — Ambulatory Visit (INDEPENDENT_AMBULATORY_CARE_PROVIDER_SITE_OTHER): Payer: PPO | Admitting: Gastroenterology

## 2018-06-18 VITALS — BP 142/72 | HR 80 | Ht 65.0 in | Wt 165.0 lb

## 2018-06-18 DIAGNOSIS — K8591 Acute pancreatitis with uninfected necrosis, unspecified: Secondary | ICD-10-CM | POA: Diagnosis not present

## 2018-06-18 DIAGNOSIS — Z9049 Acquired absence of other specified parts of digestive tract: Secondary | ICD-10-CM | POA: Diagnosis not present

## 2018-06-18 DIAGNOSIS — K863 Pseudocyst of pancreas: Secondary | ICD-10-CM

## 2018-06-18 DIAGNOSIS — Z8719 Personal history of other diseases of the digestive system: Secondary | ICD-10-CM | POA: Diagnosis not present

## 2018-06-18 DIAGNOSIS — B182 Chronic viral hepatitis C: Secondary | ICD-10-CM | POA: Diagnosis not present

## 2018-06-18 DIAGNOSIS — K862 Cyst of pancreas: Secondary | ICD-10-CM

## 2018-06-18 NOTE — Patient Instructions (Signed)
We will contact you in 3 months to schedule your follow up CT scan of the abdomen and also a follow up visit with Dr. Meridee ScoreMansouraty.

## 2018-06-18 NOTE — Progress Notes (Signed)
GASTROENTEROLOGY OUTPATIENT CLINIC VISIT   Primary Care Provider Hadley Penobbins, Robert A, MD [address] [place] [id number] [phone]  Patient Profile: Ryan Chan is a 67 y.o. male with a pmh significant for recent necrotizing gallstone pancreatitis complicated by formation of walled off necrosis/pseudocyst, hypertension, HCV infection, diabetes.  The patient presents to the [facility] for an evaluation and management of problem(s) noted below:  Problem List 1. History of acute pancreatitis   2. Necrotizing pancreatitis   3. Pancreatic pseudocyst/cyst   4. History of cholecystectomy   5. Chronic hepatitis C without hepatic coma (HCC)     History of Present Illness: Please see prior consultation note for full details of HPI.  Interval History The patient returns for planned follow-up.  He recently underwent a laparoscopic cholecystectomy without complication in October for definitive therapy the etiology of his pancreatitis.  He has done well since that procedure.  Subsequently underwent a follow-up CT abdomen pelvis with results as below that suggested persistent fluid collection surrounding the pancreas.  Overt necrosis is not seen on this cross-sectional CT.  Overall, the patient states he is doing exceedingly well.  He is weight has been stable to slightly increased and he has no evidence of early satiety or anorexia.  The patient denies any fevers or chills.  He has had some mild abdominal discomfort in the region of his incisions but this improved after his cholecystectomy in time.  He has known diabetes and is on insulin and has been on insulin since his hospital stay.  His blood sugars have been slightly up over the course the last few weeks but he has not been watching his diet as his weight has also increased.  He is having formed bowel movements on a daily basis without any diarrhea.  He denies any hematochezia or melena.  He denies any  nausea or vomiting.  He has no abdominal pain currently.  The patient describes being diagnosed with hepatitis C years ago.  He is never sought treatment because in the past he was told the treatments were very difficult and not guaranteed of cure.  He does not have a significant alcohol consumption history.  He is not sure if his wife is ever been tested for hepatitis C.  The patient denies any issues with jaundice, scleral icterus, pruritus, darkened/amber urine, clay-colored stools, LE edema, hematemesis, coffee-ground emesis, abdominal distention, confusion, new generalized pruritus.  GI Review of Systems Positive as above Negative for dysphagia, odynophagia, nausea, vomiting, jaundice  Review of Systems General: Denies fevers/chills Cardiovascular: Denies chest pain or palpitations Pulmonary: Denies shortness of breath Gastroenterological: See HPI Genitourinary: Denies darkened urine Dermatological: Denies jaundice Psychological: Mood is stable   Medications Current Outpatient Medications  Medication Sig Dispense Refill  . insulin glargine (LANTUS) 100 unit/mL SOPN Inject 20 Units into the skin at bedtime as needed (only if BGL is 200 or greater).     . metoprolol succinate (TOPROL-XL) 25 MG 24 hr tablet Take 25 mg by mouth daily.  3  . nitroGLYCERIN (NITROSTAT) 0.4 MG SL tablet Place 0.4 mg under the tongue every 5 (five) minutes as needed for chest pain.   0   No current facility-administered medications for this visit.     Allergies No Known Allergies  Histories Past Medical History:  Diagnosis Date  . Diabetes mellitus without complication (HCC)   . Essential hypertension   . GERD (gastroesophageal reflux disease)   . Hepatitis C   .  History of kidney stones   . Necrotizing pancreatitis    Past Surgical History:  Procedure Laterality Date  . BACK SURGERY    . CHOLECYSTECTOMY N/A 05/19/2018   Procedure: LAPAROSCOPIC CHOLECYSTECTOMY WITH INTRAOPERATIVE CHOLANGIOGRAM;   Surgeon: Emelia Loron, MD;  Location: [facility] OR;  Service: General;  Laterality: N/A;   Social History   Socioeconomic History  . Marital status: Married    Spouse name: Not on file  . Number of children: Not on file  . Years of education: Not on file  . Highest education level: Not on file  Occupational History  . Occupation: Ecologist work  Social Needs  . Financial resource strain: Not on file  . Food insecurity:    Worry: Not on file    Inability: Not on file  . Transportation needs:    Medical: Not on file    Non-medical: Not on file  Tobacco Use  . Smoking status: Current Every Day Smoker    Packs/day: 2.00    Years: 54.00    Pack years: 108.00    Types: Cigarettes  . Smokeless tobacco: Never Used  Substance and Sexual Activity  . Alcohol use: Not Currently  . Drug use: Not Currently  . Sexual activity: Not on file  Lifestyle  . Physical activity:    Days per week: Not on file    Minutes per session: Not on file  . Stress: Not on file  Relationships  . Social connections:    Talks on phone: Not on file    Gets together: Not on file    Attends religious service: Not on file    Active member of club or organization: Not on file    Attends meetings of clubs or organizations: Not on file    Relationship status: Not on file  . Intimate partner violence:    Fear of current or ex partner: Not on file    Emotionally abused: Not on file    Physically abused: Not on file    Forced sexual activity: Not on file  Other Topics Concern  . Not on file  Social History Narrative  . Not on file   Family History  Problem Relation Age of Onset  . GI Disease Neg Hx   . Colon cancer Neg Hx   . Esophageal cancer Neg Hx   . Pancreatic cancer Neg Hx   . Stomach cancer Neg Hx   . Liver disease Neg Hx   . Inflammatory bowel disease Neg Hx   . Rectal cancer Neg Hx    I have reviewed his medical, social, and family history in detail and updated the electronic  medical record as necessary.    PHYSICAL EXAMINATION  BP (!) 142/72   Pulse 80   Ht 5\' 5"  (1.651 m)   Wt 165 lb (74.8 kg)   BMI 27.46 kg/m   Wt Readings from Last 3 Encounters:  06/18/18 165 lb (74.8 kg)  05/11/18 165 lb 9.6 oz (75.1 kg)  05/10/18 165 lb 6.4 oz (75 kg)  GEN: NAD, appears stated age, doesn't appear chronically ill PSYCH: Cooperative, without pressured speech EYE: Conjunctivae pink, sclerae anicteric ENT: MMM CV: RR without R/Gs  RESP: CTAB posteriorly, without wheezing GI: NABS, soft, nontender, nondistended, no hepatosplenomegaly appreciated MSK/EXT: Trace bilateral lower extremity edema SKIN: No jaundice, no spider angiomata present on upper thorax NEURO:  Alert & Oriented x 3, no focal deficits   REVIEW OF DATA  I reviewed the following data at  the time of this encounter:  GI Procedures and Studies  None to review  Laboratory Studies  Reviewed in epic  Imaging Studies  05/07/18 CTAP with contrast IMPRESSION: No significant change in acute on chronic necrotizing pancreatitis with stable appearing peripancreatic fluid collections compatible with pseudocyst formation.  06/03/18 CTAP with contrast IMPRESSION: 1. By my measurements the pancreatic pseudocyst is roughly similar to prior, measuring 11.3 by 4.2 by 5.9 cm. Morphologically unchanged, there is a thin band of residual pancreatic tail tissue running between upper and lower components of the pseudocyst; the severity of parenchymal loss in this vicinity is similar to the prior exam in indicates remote prior pancreatic necrosis. 2. Faint residual stranding along the margins of the pancreatic tail and pseudocyst, along with a mildly enlarged peripancreatic lymph node, suggests some residual low-grade inflammation. 3. Interval cholecystectomy. 4. Patent portal vein and splenic vein. 5.  Aortic Atherosclerosis (ICD10-I70.0). 6. Suspected central narrowing of the thecal sac at L4-5 due to  a central disc protrusion.   ASSESSMENT  Mr. Fowles is a 67 y.o. male with a pmh significant for recent necrotizing gallstone pancreatitis complicated by formation of walled off necrosis/pseudocysts, hypertension, HCV infection, diabetes.  The patient is seen today for evaluation and management of:  1. History of acute pancreatitis   2. Necrotizing pancreatitis   3. Pancreatic pseudocyst/cyst   4. History of cholecystectomy   5. Chronic hepatitis C without hepatic coma (HCC)    The patient has done exceedingly well over the course the last few months.  He is been able to tolerate a cholecystectomy for definitive therapy of his etiology of pancreatitis.  He still has a large fluid collection however.  He remains asymptomatic at this point in time.  We discussed the role of possible cyst gastrostomy for clinical purposes but have decided that based on his completely asymptomatic nature of things that we will hold on that currently.  We will plan an interval imaging study in approximately 3 months to evaluate and see whether the cyst has continued to slowly decrease in size and resorb.  This may take months to resorb over time however if he remains asymptomatic I feel that we can try to hold on putting him through more invasive studies.  If the patient has any evidence of abdominal pain, fevers, nausea or vomiting, early satiety, weight loss then we will consider the role of earlier imaging and potential drainage at that point the patient agrees with this thought process and desire of holding on more invasive testing. Regards to his chronic hepatitis C which he has known about for years we will add on laboratories to be drawn at the patient's convenience to evaluate his hepatitis A and hepatitis B immune status.Eventually he will benefit from a liver ultrasound with elastography but we will plan to send a fiber sure with a set of labs to help guide [place] in regards to potential therapies down the road.   He will need a genotype as well.  He is not clear if he wants therapy currently but will think about it and as of outlined with the patient the therapies are much better and easier to take as compared to a few years ago.  He is worried about the cost due to his Medicare status however that is something that we can bridge if necessary down the road.  We will hold on infectious disease referral currently.  The following issues were discussed with the patient:  Natural history of HCV infection  was reviewed the patient.  The role of liver biopsy +/- Fibroscan/Elastography imaging to stage disease reviewed; including risks/benefits.  We discussed that HCV transmission is low among monogamous heterosexual couples but possible.  Condom use is not mandated by current guidelines, but the couple should discuss this decision together.  Wife should be considered for screening if she has not been.  We discussed the importance of avoiding HIV infection.  Patient was informed that acetaminophen is an acceptable pain medication but to limit amount to no more than 2000-3000 mg per day.  Patient was counseled about the risks of alcohol use.  Patient was advised to avoid sharing toothbrushes, razors, nail clippers.  All patient questions were answered, to the best of my ability, and the patient agrees to the aforementioned plan of action with follow-up as indicated.   PLAN  1. History of acute pancreatitis  2. Necrotizing pancreatitis  3. Pancreatic pseudocyst/cyst - Repeat CTAP with contrast in 70-months to evaluate for cyst persistence  4. History of cholecystectomy  5. Chronic hepatitis C without hepatic coma (HCC) - Hepatitis A Ab, Total; Future - Hepatitis B Core Antibody, total; Future - Hepatitis B Surface AntiBODY; Future - Hepatitis B Surface AntiGEN; Future - HCV FibroSURE; Future - Holding on therapy currently per patient request - Will consider Liver U/S with elastography in the coming months   Orders  Placed This Encounter  Procedures  . Hepatitis A Ab, Total  . Hepatitis B Core Antibody, total  . Hepatitis B Surface AntiBODY  . Hepatitis B Surface AntiGEN  . HCV FibroSURE    New Prescriptions   No medications on file   Modified Medications   No medications on file    Planned Follow Up: Return in about 3 months (around 09/18/2018).   Corliss Parish, MD [facility] Office # [phone]

## 2018-06-22 ENCOUNTER — Encounter: Payer: Self-pay | Admitting: Gastroenterology

## 2018-06-22 DIAGNOSIS — Z9049 Acquired absence of other specified parts of digestive tract: Secondary | ICD-10-CM | POA: Insufficient documentation

## 2018-08-24 DIAGNOSIS — R52 Pain, unspecified: Secondary | ICD-10-CM | POA: Diagnosis not present

## 2018-08-24 DIAGNOSIS — Z130 Encounter for screening for diseases of the blood and blood-forming organs and certain disorders involving the immune mechanism: Secondary | ICD-10-CM | POA: Diagnosis not present

## 2018-08-24 DIAGNOSIS — I10 Essential (primary) hypertension: Secondary | ICD-10-CM | POA: Diagnosis not present

## 2018-08-24 DIAGNOSIS — F17210 Nicotine dependence, cigarettes, uncomplicated: Secondary | ICD-10-CM | POA: Diagnosis not present

## 2018-08-24 DIAGNOSIS — K7689 Other specified diseases of liver: Secondary | ICD-10-CM | POA: Diagnosis not present

## 2018-08-24 DIAGNOSIS — B1920 Unspecified viral hepatitis C without hepatic coma: Secondary | ICD-10-CM | POA: Diagnosis not present

## 2018-08-24 DIAGNOSIS — E663 Overweight: Secondary | ICD-10-CM | POA: Diagnosis not present

## 2018-08-24 DIAGNOSIS — E119 Type 2 diabetes mellitus without complications: Secondary | ICD-10-CM | POA: Diagnosis not present

## 2018-08-24 DIAGNOSIS — R0602 Shortness of breath: Secondary | ICD-10-CM | POA: Diagnosis not present

## 2018-08-24 DIAGNOSIS — R5383 Other fatigue: Secondary | ICD-10-CM | POA: Diagnosis not present

## 2018-08-24 DIAGNOSIS — Z0189 Encounter for other specified special examinations: Secondary | ICD-10-CM | POA: Diagnosis not present

## 2018-08-31 DIAGNOSIS — E119 Type 2 diabetes mellitus without complications: Secondary | ICD-10-CM | POA: Diagnosis not present

## 2018-09-06 ENCOUNTER — Telehealth: Payer: Self-pay | Admitting: *Deleted

## 2018-09-06 DIAGNOSIS — Z8719 Personal history of other diseases of the digestive system: Secondary | ICD-10-CM

## 2018-09-06 DIAGNOSIS — K863 Pseudocyst of pancreas: Secondary | ICD-10-CM

## 2018-09-06 NOTE — Telephone Encounter (Signed)
-----   Message from Dossie ArbourKelly A Tadros, LPN sent at 1/6/10962/10/2018  8:04 AM EST -----  ----- Message ----- From: Jessee AversLewellyn, Amanda L, CMA Sent: 09/06/2018 To: Dossie ArbourKelly A Tadros, LPN  Patient needs 3 month follow CT scan of abdomen with contrast and office visit to follow with Dr. Meridee ScoreMansouraty. CT scan is for hx of pancreatitis and pancreatic pseudocyst.

## 2018-09-06 NOTE — Telephone Encounter (Signed)
Pt scheduled for 09/21/2018 @ 2:30pm [place] CT. Contrast placed at front desk for pt to pick up. Pt has been informed.

## 2018-09-16 ENCOUNTER — Other Ambulatory Visit (INDEPENDENT_AMBULATORY_CARE_PROVIDER_SITE_OTHER): Payer: PPO

## 2018-09-16 DIAGNOSIS — Z8719 Personal history of other diseases of the digestive system: Secondary | ICD-10-CM | POA: Diagnosis not present

## 2018-09-16 DIAGNOSIS — K863 Pseudocyst of pancreas: Secondary | ICD-10-CM

## 2018-09-16 LAB — CREATININE, SERUM: CREATININE: 0.84 mg/dL (ref 0.40–1.50)

## 2018-09-16 LAB — BUN: BUN: 16 mg/dL (ref 6–23)

## 2018-09-20 ENCOUNTER — Telehealth: Payer: Self-pay | Admitting: *Deleted

## 2018-09-20 NOTE — Telephone Encounter (Signed)
-----   Message -----  From: Yip Chiquito, CMA  Sent: 09/20/2018 11:42 AM EST  To: Rayfield Citizen, NT  Subject: FW: CT order                    ----- Message -----  From: Lemar Lofty., MD  Sent: 09/20/2018 11:40 AM EST  To: Clouatre Chiquito, CMA  Subject: RE: CT order                   Dottie,  OK to just be CT-Abdomen with IV/PO contrast.  Does not need to be pancreas protocol.  Thanks.  GM  ----- Message -----  From: Hands Chiquito, CMA  Sent: 09/20/2018 10:25 AM EST  To: Lemar Lofty., MD  Subject: FW: CT order                   Dr Meridee Score-  Please advise...  Thanks!  Dottie  ----- Message -----  From: Rayfield Citizen, NT  Sent: 09/20/2018 10:09 AM EST  To: Nazareno Chiquito, CMA  Subject: CT order                     Dottie   Pt is having a CT to Fu pancreatitis and pseudocyst. Will you check with DR Mansouraty and make sure he is not wanting a pancreatic protocol. Pt is having CT 2/18   Thank you   Misty Stanley

## 2018-09-21 ENCOUNTER — Ambulatory Visit (INDEPENDENT_AMBULATORY_CARE_PROVIDER_SITE_OTHER)
Admission: RE | Admit: 2018-09-21 | Discharge: 2018-09-21 | Disposition: A | Discharge status: Home / Self Care | Payer: PPO | Source: Ambulatory Visit | Attending: Gastroenterology | Admitting: Gastroenterology

## 2018-09-21 DIAGNOSIS — Z8719 Personal history of other diseases of the digestive system: Secondary | ICD-10-CM

## 2018-09-21 DIAGNOSIS — K863 Pseudocyst of pancreas: Secondary | ICD-10-CM | POA: Diagnosis not present

## 2018-09-21 MED ORDER — IOPAMIDOL (ISOVUE-300) INJECTION 61%
100.0000 mL | Freq: Once | INTRAVENOUS | Status: AC | PRN
  Administered 2018-09-21: 100 mL via INTRAVENOUS

## 2018-09-23 ENCOUNTER — Other Ambulatory Visit: Payer: Self-pay

## 2018-09-23 DIAGNOSIS — I10 Essential (primary) hypertension: Secondary | ICD-10-CM | POA: Diagnosis not present

## 2018-09-23 DIAGNOSIS — E663 Overweight: Secondary | ICD-10-CM | POA: Diagnosis not present

## 2018-09-23 DIAGNOSIS — Z713 Dietary counseling and surveillance: Secondary | ICD-10-CM | POA: Diagnosis not present

## 2018-09-23 DIAGNOSIS — E119 Type 2 diabetes mellitus without complications: Secondary | ICD-10-CM | POA: Diagnosis not present

## 2018-10-28 ENCOUNTER — Ambulatory Visit: Payer: PPO | Admitting: Gastroenterology

## 2018-11-02 ENCOUNTER — Ambulatory Visit: Payer: PPO | Admitting: Gastroenterology

## 2018-11-22 DIAGNOSIS — K863 Pseudocyst of pancreas: Secondary | ICD-10-CM | POA: Diagnosis not present

## 2018-11-22 DIAGNOSIS — E119 Type 2 diabetes mellitus without complications: Secondary | ICD-10-CM | POA: Diagnosis not present

## 2018-11-22 DIAGNOSIS — K219 Gastro-esophageal reflux disease without esophagitis: Secondary | ICD-10-CM | POA: Diagnosis not present

## 2018-11-22 DIAGNOSIS — I10 Essential (primary) hypertension: Secondary | ICD-10-CM | POA: Diagnosis not present

## 2019-02-21 DIAGNOSIS — E119 Type 2 diabetes mellitus without complications: Secondary | ICD-10-CM | POA: Diagnosis not present

## 2019-02-21 DIAGNOSIS — K5903 Drug induced constipation: Secondary | ICD-10-CM | POA: Diagnosis not present

## 2019-02-21 DIAGNOSIS — K219 Gastro-esophageal reflux disease without esophagitis: Secondary | ICD-10-CM | POA: Diagnosis not present

## 2019-02-21 DIAGNOSIS — I10 Essential (primary) hypertension: Secondary | ICD-10-CM | POA: Diagnosis not present

## 2019-02-21 DIAGNOSIS — K863 Pseudocyst of pancreas: Secondary | ICD-10-CM | POA: Diagnosis not present

## 2019-04-25 DIAGNOSIS — E119 Type 2 diabetes mellitus without complications: Secondary | ICD-10-CM | POA: Diagnosis not present

## 2019-04-25 DIAGNOSIS — K219 Gastro-esophageal reflux disease without esophagitis: Secondary | ICD-10-CM | POA: Diagnosis not present

## 2019-04-25 DIAGNOSIS — I10 Essential (primary) hypertension: Secondary | ICD-10-CM | POA: Diagnosis not present

## 2019-04-25 DIAGNOSIS — F17210 Nicotine dependence, cigarettes, uncomplicated: Secondary | ICD-10-CM | POA: Diagnosis not present

## 2019-06-23 ENCOUNTER — Telehealth: Payer: Self-pay | Admitting: General Surgery

## 2019-06-23 ENCOUNTER — Other Ambulatory Visit (INDEPENDENT_AMBULATORY_CARE_PROVIDER_SITE_OTHER): Payer: PPO

## 2019-06-23 ENCOUNTER — Ambulatory Visit (INDEPENDENT_AMBULATORY_CARE_PROVIDER_SITE_OTHER): Payer: PPO | Admitting: Gastroenterology

## 2019-06-23 ENCOUNTER — Encounter: Payer: Self-pay | Admitting: Gastroenterology

## 2019-06-23 VITALS — BP 126/80 | HR 83 | Temp 97.9°F | Ht 65.0 in | Wt 178.0 lb

## 2019-06-23 DIAGNOSIS — K862 Cyst of pancreas: Secondary | ICD-10-CM

## 2019-06-23 DIAGNOSIS — Z8719 Personal history of other diseases of the digestive system: Secondary | ICD-10-CM

## 2019-06-23 DIAGNOSIS — Z1212 Encounter for screening for malignant neoplasm of rectum: Secondary | ICD-10-CM

## 2019-06-23 DIAGNOSIS — K863 Pseudocyst of pancreas: Secondary | ICD-10-CM

## 2019-06-23 DIAGNOSIS — Z1211 Encounter for screening for malignant neoplasm of colon: Secondary | ICD-10-CM | POA: Diagnosis not present

## 2019-06-23 DIAGNOSIS — B182 Chronic viral hepatitis C: Secondary | ICD-10-CM | POA: Diagnosis not present

## 2019-06-23 LAB — CBC WITH DIFFERENTIAL/PLATELET
Basophils Absolute: 0.1 10*3/uL (ref 0.0–0.1)
Basophils Relative: 1.2 % (ref 0.0–3.0)
Eosinophils Absolute: 0.5 10*3/uL (ref 0.0–0.7)
Eosinophils Relative: 7.5 % — ABNORMAL HIGH (ref 0.0–5.0)
HCT: 44.6 % (ref 39.0–52.0)
Hemoglobin: 15.2 g/dL (ref 13.0–17.0)
Lymphocytes Relative: 28.2 % (ref 12.0–46.0)
Lymphs Abs: 1.8 10*3/uL (ref 0.7–4.0)
MCHC: 34 g/dL (ref 30.0–36.0)
MCV: 90.1 fl (ref 78.0–100.0)
Monocytes Absolute: 0.4 10*3/uL (ref 0.1–1.0)
Monocytes Relative: 6.8 % (ref 3.0–12.0)
Neutro Abs: 3.6 10*3/uL (ref 1.4–7.7)
Neutrophils Relative %: 56.3 % (ref 43.0–77.0)
Platelets: 215 10*3/uL (ref 150.0–400.0)
RBC: 4.95 Mil/uL (ref 4.22–5.81)
RDW: 13.3 % (ref 11.5–15.5)
WBC: 6.3 10*3/uL (ref 4.0–10.5)

## 2019-06-23 LAB — COMPREHENSIVE METABOLIC PANEL
ALT: 28 U/L (ref 0–53)
AST: 25 U/L (ref 0–37)
Albumin: 4.3 g/dL (ref 3.5–5.2)
Alkaline Phosphatase: 60 U/L (ref 39–117)
BUN: 15 mg/dL (ref 6–23)
CO2: 25 mEq/L (ref 19–32)
Calcium: 9.2 mg/dL (ref 8.4–10.5)
Chloride: 103 mEq/L (ref 96–112)
Creatinine, Ser: 0.92 mg/dL (ref 0.40–1.50)
GFR: 81.61 mL/min (ref >60.00)
Glucose, Bld: 245 mg/dL — ABNORMAL HIGH (ref 70–99)
Potassium: 4.6 mEq/L (ref 3.5–5.1)
Sodium: 140 mEq/L (ref 135–145)
Total Bilirubin: 0.7 mg/dL (ref 0.2–1.2)
Total Protein: 7 g/dL (ref 6.0–8.3)

## 2019-06-23 LAB — LIPASE: Lipase: 842 U/L — ABNORMAL HIGH (ref 11.0–59.0)

## 2019-06-23 NOTE — Addendum Note (Signed)
Addended by: Lanny Hurst A on: 06/23/2019 04:07 PM   Modules accepted: Orders

## 2019-06-23 NOTE — Telephone Encounter (Signed)
Spoke with Kenney Houseman at [facility] to verify they could see this patients outstanding labs for Dr Rush Landmark from 06/22/2018. Kenney Houseman verified that they could. I expressed to her that they needed to be drawn today with the new orders that Alonza Bogus, PA-c has placed. She verbalized understanding.

## 2019-06-23 NOTE — Addendum Note (Signed)
Addended by: Lanny Hurst A on: 06/23/2019 04:06 PM   Modules accepted: Orders

## 2019-06-23 NOTE — Addendum Note (Signed)
Addended by: Herman Fiero A on: 06/23/2019 04:07 PM   Modules accepted: Orders  

## 2019-06-23 NOTE — Telephone Encounter (Signed)
Janett Billow asked me why all the labs in the patients orders were not drawn today.all labs including the ones from Dr Rush Landmark 1 year ago.  I went down to the lab and spoke with Tonya because no one in the lab answered. Kenney Houseman stated that she called upstairs and spoke with Rovanda before the patients labs were drawn,  just to verify again that she was drawing all labs. She was told not to draw Dr Jaci Lazier

## 2019-06-23 NOTE — Patient Instructions (Signed)
If you are age 68 or older, your body mass index should be between 23-30. Your Body mass index is 29.62 kg/m. If this is out of the aforementioned range listed, please consider follow up with your Primary Care Provider.  If you are age 82 or younger, your body mass index should be between 19-25. Your Body mass index is 29.62 kg/m. If this is out of the aformentioned range listed, please consider follow up with your Primary Care Provider.   Your provider has requested that you go to the basement level for lab work before leaving today. Press "B" on the elevator. The lab is located at the first door on the left as you exit the elevator.  You have been scheduled for a CT scan of the abdomen and pelvis at [facility] ([address]this is in the same building as [facility]).   You are scheduled on 07/08/2019 at 9:30am. You should arrive 15 minutes prior to your appointment time for registration. Please follow the written instructions below on the day of your exam:  WARNING: IF YOU ARE ALLERGIC TO IODINE/X-RAY DYE, PLEASE NOTIFY RADIOLOGY IMMEDIATELY AT [phone]! YOU WILL BE GIVEN A 13 HOUR PREMEDICATION PREP.  1) Do not eat or drink anything after 5:30 am (4 hours prior to your test) 2) You have been given 2 bottles of oral contrast to drink. The solution may taste better if refrigerated, but do NOT add ice or any other liquid to this solution. Shake well before drinking.    Drink 1 bottle of contrast @ 7:30am (2 hours prior to your exam)  Drink 1 bottle of contrast @ 8:30 am (1 hour prior to your exam)  You may take any medications as prescribed with a small amount of water, if necessary. If you take any of the following medications: METFORMIN, GLUCOPHAGE, GLUCOVANCE, AVANDAMET, RIOMET, FORTAMET, [place] MET, JANUMET, GLUMETZA or METAGLIP, you MAY be asked to HOLD this medication 48 hours AFTER the exam.  The purpose of you drinking the oral contrast is to aid in the  visualization of your intestinal tract. The contrast solution may cause some diarrhea. Depending on your individual set of symptoms, you may also receive an intravenous injection of x-ray contrast/dye. Plan on being at [facility] for 30 minutes or longer, depending on the type of exam you are having performed.  This test typically takes 30-45 minutes to complete.  If you have any questions regarding your exam or if you need to reschedule, you may call the CT department at [phone] between the hours of 8:00 am and 5:00 pm, Monday-Friday.  Thank you for choosing me and [facility]  Your provider has ordered Cologuard testing as an option for colon cancer screening. This is performed by [employer] and may be out of network with your insurance. PRIOR to completing the test, it is YOUR responsibility to contact your insurance about covered benefits for this test. Your out of pocket expense could be anywhere from $0.00 to $649.00.   When you call to check coverage with your insurer, please provide the following information:   -The ONLY provider of Cologuard is [place] code for Cologuard is [phone].  [employer] NPI # [id number]  -[employer] Tax ID # [id number]   We have already sent your demographic and insurance information to [employer] (phone number [phone]) and they should contact you within the next week regarding your test. If you have not heard  from them within the next week, please call our office at [phone].  You have been recommended to have an Endoscopy with Dr Rush Landmark. Please contact our office to schedule. [phone] ________________________________________________________________________

## 2019-06-23 NOTE — Telephone Encounter (Signed)
Contacted the patient and explained that Alonza Bogus, PA-C wanted his CT moved up. [facility] moved his appointment to 06/29/2019 @ 12.00. Patient instructed to be NPO 4 hours prior. He will drink the 1st bottle of contrast at 10:00am and the 2nd at 11:00am

## 2019-06-23 NOTE — Addendum Note (Signed)
Addended by: Andreu Drudge A on: 06/23/2019 04:07 PM   Modules accepted: Orders  

## 2019-06-23 NOTE — Progress Notes (Signed)
Attending Physician's Attestation   I have reviewed the chart.   I agree with the Advanced Practitioner's note, impression, and recommendations with any updates as below.  Agree with evaluation via imaging, laboratories, and potentially endoscopy.  Patient had been scheduled to see me back in follow-up in March of this year but did not follow-up.  We will see how the previous pseudocyst looks on imaging.  May require both an upper and lower endoscopy but if he only wants the upper endoscopy for now we will consider that if his cyst does not look to be playing a role with things.  Look forward to seeing what his labs look as well.  Justice Britain, MD [facility] Office # [phone]

## 2019-06-23 NOTE — Progress Notes (Signed)
06/23/2019 [place] DRESSER [id number] [birth year]   HISTORY OF PRESENT ILLNESS: This is a 68 year old male who is a patient of Dr. Donneta Romberg.  He has a history of gallstone pancreatitis with pancreatic pseudocyst as well as hepatitis C that has not been treated.  He is status post cholecystectomy.  Please see Dr. Donneta Romberg last office note from June 18, 2018 for further details regarding those issues.  His last CT scan in February 2020 showed mild contraction of the pseudocyst along the body of the pancreas with scant normal pancreatic tissue within the mid pancreas consistent with pancreatic necrosis and loss of tissue.  The pancreas and head of the pancreas appeared normal with no evidence of acute inflammation.  Common bile duct was normal status post cholecystectomy.  Anyway, he presents here today with complaints of epigastric abdominal pain and mid chest/substernal chest pain.  He says that he thought it was acid reflux related.  Has been on Dexilant 30 mg daily for almost 8 weeks now and has not noticed much improvement.  He says the pain is constant and will usually last 3 or 4 days at a time.  The epigastric pain usually gets to about a 5 at its worst, but the chest pain sometimes gets to an 8 out of 10 on the pain scale.  No nausea or vomiting.  He denies any real classic sensation of heartburn or reflux.  He has never had colonoscopy in the past and is not extremely interested.  He denies any issues with moving his bowels.  Denies any rectal bleeding.  No family history of colon cancer.   Past Medical History:  Diagnosis Date  . Diabetes mellitus without complication ([place])   . Essential hypertension   . GERD (gastroesophageal reflux disease)   . Hepatitis C   . History of kidney stones   . Necrotizing pancreatitis    Past Surgical History:  Procedure Laterality Date  . BACK SURGERY    . CHOLECYSTECTOMY N/A 05/19/2018   Procedure: LAPAROSCOPIC CHOLECYSTECTOMY  WITH INTRAOPERATIVE CHOLANGIOGRAM;  Surgeon: Rolm Bookbinder, MD;  Location: [facility];  Service: General;  Laterality: N/A;    reports that he has been smoking cigarettes. He has a 108.00 pack-year smoking history. He has never used smokeless tobacco. He reports previous alcohol use. He reports previous drug use. family history is not on file. No Known Allergies    Outpatient Encounter Medications as of 06/23/2019  Medication Sig  . Dexlansoprazole (DEXILANT) 30 MG capsule Take 30 mg by mouth daily.  . insulin glargine (LANTUS) 100 unit/mL SOPN Inject 20 Units into the skin at bedtime as needed (only if BGL is 200 or greater).   . metoprolol succinate (TOPROL-XL) 25 MG 24 hr tablet Take 25 mg by mouth daily.  . nitroGLYCERIN (NITROSTAT) 0.4 MG SL tablet Place 0.4 mg under the tongue every 5 (five) minutes as needed for chest pain.    No facility-administered encounter medications on file as of 06/23/2019.      REVIEW OF SYSTEMS  : All other systems reviewed and negative except where noted in the History of Present Illness.   PHYSICAL EXAM: BP 126/80   Pulse 83   Temp 97.9 F (36.6 C)   Ht 5\' 5"  (1.651 m)   Wt 178 lb (80.7 kg)   BMI 29.62 kg/m  General: Well developed white male in no acute distress Head: Normocephalic and atraumatic Eyes:  Sclerae anicteric, conjunctiva pink. Ears: Normal auditory acuity Lungs: Clear throughout  to auscultation; no increased WOB. Heart: Regular rate and rhythm; no M/R/G. Abdomen: Soft, non-distended.  BS present.  Mild epigastric TTP. Musculoskeletal: Symmetrical with no gross deformities  Skin: No lesions on visible extremities Extremities: No edema  Neurological: Alert oriented x 4, grossly non-focal Psychological:  Alert and cooperative. Normal mood and affect  ASSESSMENT AND PLAN: *68 year old male with complaints of epigastric abdominal pain and substernal chest pain that he thought was reflux related.  Not much improvement on Dexilant  30 mg daily.  No dysphagia.  We will plan for EGD with Dr. Meridee Score. *History of gallstone pancreatitis and pancreatic pseudocyst: Is now status post cholecystectomy.  Repeat CT scan in February 2020 showed decreasing size of pseudocyst.  Has not had any follow-up since then.  We will plan for repeat CT scan of the abdomen pelvis with contrast to reassess and be sure that is not causing any of his above symptoms.  Will check CBC, CMP, and lipase. *History of hepatitis C: Has not had treatment.  Dr. Meridee Score had placed orders for hepatitis A and B studies as well as an HCV FibroSure study.  We will have those collected today. *CRC screening: Patient's never had a colonoscopy.  He is declining for now, but he did agree to a Cologuard study and says that if positive he would proceed with colonoscopy.  Will order.  **The risks, benefits, and alternatives to EGD were discussed with the patient and he consents to proceed.   CC:  Mikael Spray, NP

## 2019-06-25 LAB — HCV FIBROSURE
ALPHA 2-MACROGLOBULINS, QN: 304 mg/dL — ABNORMAL HIGH (ref 110–276)
ALT (SGPT) P5P: 36 IU/L (ref 0–55)
Apolipoprotein A-1: 100 mg/dL — ABNORMAL LOW (ref 101–178)
Bilirubin, Total: 0.3 mg/dL (ref 0.0–1.2)
Fibrosis Score: 0.5 — ABNORMAL HIGH (ref 0.00–0.21)
GGT: 18 IU/L (ref 0–65)
Haptoglobin: 139 mg/dL (ref 32–363)
Necroinflammat Activity Score: 0.24 — ABNORMAL HIGH (ref 0.00–0.17)

## 2019-06-27 ENCOUNTER — Telehealth: Payer: Self-pay | Admitting: Gastroenterology

## 2019-06-27 DIAGNOSIS — K863 Pseudocyst of pancreas: Secondary | ICD-10-CM

## 2019-06-27 DIAGNOSIS — Z1159 Encounter for screening for other viral diseases: Secondary | ICD-10-CM

## 2019-06-27 DIAGNOSIS — K862 Cyst of pancreas: Secondary | ICD-10-CM

## 2019-06-27 DIAGNOSIS — Z8719 Personal history of other diseases of the digestive system: Secondary | ICD-10-CM

## 2019-06-27 NOTE — Telephone Encounter (Signed)
Printed EGD instructions and placed amb referral, covid screening, contacted the patient back and he will be in on 06/29/2019 @11 :30 to get instructions and sign his consent.

## 2019-06-27 NOTE — Telephone Encounter (Signed)
Pt is requesting a call before 2:30pm, he states that his wife needs to coordinate her schedule at work before that time so she can accompany pt.

## 2019-06-27 NOTE — Telephone Encounter (Signed)
The patient contacted the office to schedule his appointment to have an EGD as recommended. His wife is able to bring him on 07/07/2019 @3 :00.  Expressed to him I would tentatively schedule and get back in contact with him regarding this appointment. Patient was informed he needed to COVID test 2 days prior and verbalized understanding.

## 2019-06-27 NOTE — Telephone Encounter (Signed)
Pls call pt, he stated that you needed to speak with him to schedule a test.

## 2019-06-28 ENCOUNTER — Encounter: Payer: Self-pay | Admitting: Gastroenterology

## 2019-06-29 ENCOUNTER — Encounter (HOSPITAL_COMMUNITY): Payer: Self-pay

## 2019-06-29 ENCOUNTER — Ambulatory Visit (HOSPITAL_COMMUNITY)
Admission: RE | Admit: 2019-06-29 | Discharge: 2019-06-29 | Disposition: A | Discharge status: Home / Self Care | Payer: PPO | Source: Ambulatory Visit | Attending: Gastroenterology | Admitting: Gastroenterology

## 2019-06-29 ENCOUNTER — Other Ambulatory Visit: Payer: Self-pay

## 2019-06-29 DIAGNOSIS — Z1211 Encounter for screening for malignant neoplasm of colon: Secondary | ICD-10-CM

## 2019-06-29 DIAGNOSIS — Z8719 Personal history of other diseases of the digestive system: Secondary | ICD-10-CM | POA: Diagnosis not present

## 2019-06-29 DIAGNOSIS — N200 Calculus of kidney: Secondary | ICD-10-CM | POA: Diagnosis not present

## 2019-06-29 DIAGNOSIS — K863 Pseudocyst of pancreas: Secondary | ICD-10-CM | POA: Diagnosis not present

## 2019-06-29 DIAGNOSIS — B182 Chronic viral hepatitis C: Secondary | ICD-10-CM | POA: Insufficient documentation

## 2019-06-29 DIAGNOSIS — K862 Cyst of pancreas: Secondary | ICD-10-CM | POA: Diagnosis not present

## 2019-06-29 DIAGNOSIS — K861 Other chronic pancreatitis: Secondary | ICD-10-CM | POA: Diagnosis not present

## 2019-06-29 DIAGNOSIS — Z1212 Encounter for screening for malignant neoplasm of rectum: Secondary | ICD-10-CM | POA: Diagnosis not present

## 2019-06-29 MED ORDER — IOHEXOL 300 MG/ML  SOLN
100.0000 mL | Freq: Once | INTRAMUSCULAR | Status: AC | PRN
  Administered 2019-06-29: 14:00:00 100 mL via INTRAVENOUS

## 2019-06-29 NOTE — Telephone Encounter (Signed)
Patient came to clinic and I went over instructions for EGD and printed instructions for his diabetes, he is on lantus. Patient signed consents for EGD and verbalized understanding.

## 2019-07-05 ENCOUNTER — Ambulatory Visit (INDEPENDENT_AMBULATORY_CARE_PROVIDER_SITE_OTHER): Payer: PPO

## 2019-07-05 ENCOUNTER — Other Ambulatory Visit: Payer: Self-pay | Admitting: Gastroenterology

## 2019-07-05 DIAGNOSIS — Z1159 Encounter for screening for other viral diseases: Secondary | ICD-10-CM | POA: Diagnosis not present

## 2019-07-06 DIAGNOSIS — Z1211 Encounter for screening for malignant neoplasm of colon: Secondary | ICD-10-CM | POA: Diagnosis not present

## 2019-07-06 DIAGNOSIS — Z1212 Encounter for screening for malignant neoplasm of rectum: Secondary | ICD-10-CM | POA: Diagnosis not present

## 2019-07-06 LAB — SARS CORONAVIRUS 2 (TAT 6-24 HRS): SARS Coronavirus 2: NEGATIVE

## 2019-07-07 ENCOUNTER — Encounter: Payer: Self-pay | Admitting: Gastroenterology

## 2019-07-07 ENCOUNTER — Other Ambulatory Visit: Payer: Self-pay

## 2019-07-07 ENCOUNTER — Ambulatory Visit (AMBULATORY_SURGERY_CENTER): Payer: PPO | Admitting: Gastroenterology

## 2019-07-07 VITALS — BP 120/68 | HR 72 | Temp 98.0°F | Resp 16 | Ht 65.0 in | Wt 178.0 lb

## 2019-07-07 DIAGNOSIS — K208 Other esophagitis without bleeding: Secondary | ICD-10-CM | POA: Diagnosis not present

## 2019-07-07 DIAGNOSIS — K2950 Unspecified chronic gastritis without bleeding: Secondary | ICD-10-CM | POA: Diagnosis not present

## 2019-07-07 DIAGNOSIS — Z8719 Personal history of other diseases of the digestive system: Secondary | ICD-10-CM

## 2019-07-07 DIAGNOSIS — E119 Type 2 diabetes mellitus without complications: Secondary | ICD-10-CM | POA: Diagnosis not present

## 2019-07-07 DIAGNOSIS — K3189 Other diseases of stomach and duodenum: Secondary | ICD-10-CM | POA: Diagnosis not present

## 2019-07-07 DIAGNOSIS — I10 Essential (primary) hypertension: Secondary | ICD-10-CM | POA: Diagnosis not present

## 2019-07-07 DIAGNOSIS — K219 Gastro-esophageal reflux disease without esophagitis: Secondary | ICD-10-CM | POA: Diagnosis not present

## 2019-07-07 MED ORDER — SODIUM CHLORIDE 0.9 % IV SOLN: 500.0000 mL | Freq: Once | INTRAVENOUS | Status: AC

## 2019-07-07 MED ORDER — DEXILANT 30 MG PO CPDR: 30.0000 mg | DELAYED_RELEASE_CAPSULE | Freq: Two times a day (BID) | ORAL | 1 refills | Status: AC

## 2019-07-07 NOTE — Op Note (Signed)
[facility] Patient Name: Ryan Chan Procedure Date: 07/07/2019 3:10 PM MRN: [id number] Endoscopist: Justice Britain , MD Age: 68 Referring MD:  Date of Birth: [birth year] Gender: Male Account #: [id number] Procedure:                Upper GI endoscopy Indications:              Abdominal pain, Heartburn, History of Pancreatitis                            with persistent fluid collection in body region Medicines:                Monitored Anesthesia Care Procedure:                Pre-Anesthesia Assessment:                           - Prior to the procedure, a History and Physical                            was performed, and patient medications and                            allergies were reviewed. The patient's tolerance of                            previous anesthesia was also reviewed. The risks                            and benefits of the procedure and the sedation                            options and risks were discussed with the patient.                            All questions were answered, and informed consent                            was obtained. Prior Anticoagulants: The patient has                            taken no previous anticoagulant or antiplatelet                            agents. ASA Grade Assessment: III - A patient with                            severe systemic disease. After reviewing the risks                            and benefits, the patient was deemed in                            satisfactory condition to undergo the procedure.  After obtaining informed consent, the endoscope was                            passed under direct vision. Throughout the                            procedure, the patient's blood pressure, pulse, and                            oxygen saturations were monitored continuously. The                            Endoscope was introduced through the mouth, and    advanced to the second part of duodenum. The upper                            GI endoscopy was accomplished without difficulty.                            The patient tolerated the procedure. Scope In: Scope Out: Findings:                 No gross lesions were noted in the proximal                            esophagus and in the mid esophagus. Biopsies were                            taken with a cold forceps for histology to rule out                            EoE/LoE.                           A single island of salmon-colored mucosa was                            present at 42 cm. No other visible abnormalities                            were present. Biopsies were taken with a cold                            forceps for histology.                           The Z-line was irregular and was found 43.5 cm from                            the incisors.                           Patchy hemorrhagic mucosa with no bleeding and no                              stigmata of recent bleeding was found in the                            cardia, in the gastric fundus and in the gastric                            body.                           No other gross lesions were noted in the entire                            examined stomach. Biopsies were taken with a cold                            forceps for histology and Helicobacter pylori                            testing.                           Localized mildly erythematous mucosa without active                            bleeding and with no stigmata of bleeding was found                            in the duodenal bulb.                           No other gross lesions were noted in the entire                            examined duodenum. Biopsies were taken with a cold                            forceps for histology. Complications:            No immediate complications. Estimated Blood Loss:     Estimated blood loss was minimal. Impression:                - No gross lesions in esophagus proximally.                            Biopsied.                           - Salmon-colored mucosa suspicious for Barrett's                            esophagus. Biopsied.                           - Z-line irregular, 44 cm from the incisors.                           -  Hemorrhagic gastropathy. Biopsied for HP.                           - Erythematous duodenopathy in bulb. Otherwise no                            gross lesions in the entire examined duodenum.                            Biopsied. Recommendation:           - The patient will be observed post-procedure,                            until all discharge criteria are met.                           - Discharge patient to home.                           - Patient has a contact number available for                            emergencies. The signs and symptoms of potential                            delayed complications were discussed with the                            patient. Return to normal activities tomorrow.                            Written discharge instructions were provided to the                            patient.                           - Resume previous diet.                           - Increase Dexilant to 30 mg BID for next 2-months                            to see if we can improve inflammatory process                            occuring currently in stomach.                           - Minimize NSAID use.                           - Await pathology results.                           - Observe patient's clinical   course.                           - Return to GI clinic in 4 weeks if symptoms are                            persisting will need to discuss possible role of                            EUS Cystgastrostomy, though not clear in setting of                            not having early satiety/nausea/vomiting/persistent                            pain that this is  playing a role currently.                           - The findings and recommendations were discussed                            with the patient. Justice Britain, MD 07/07/2019 3:56:58 PM

## 2019-07-07 NOTE — Progress Notes (Signed)
Report given to PACU, vss 

## 2019-07-07 NOTE — Progress Notes (Signed)
Reviewed Medical and Surgical hx  Temp LC  VS CW

## 2019-07-07 NOTE — Patient Instructions (Signed)
YOU HAD AN ENDOSCOPIC PROCEDURE TODAY AT [facility]:   Refer to the procedure report that was given to you for any specific questions about what was found during the examination.  If the procedure report does not answer your questions, please call your gastroenterologist to clarify.  If you requested that your care partner not be given the details of your procedure findings, then the procedure report has been included in a sealed envelope for you to review at your convenience later.  YOU SHOULD EXPECT: Some feelings of bloating in the abdomen. Passage of more gas than usual.  Walking can help get rid of the air that was put into your GI tract during the procedure and reduce the bloating. If you had a lower endoscopy (such as a colonoscopy or flexible sigmoidoscopy) you may notice spotting of blood in your stool or on the toilet paper. If you underwent a bowel prep for your procedure, you may not have a normal bowel movement for a few days.  Please Note:  You might notice some irritation and congestion in your nose or some drainage.  This is from the oxygen used during your procedure.  There is no need for concern and it should clear up in a day or so.  SYMPTOMS TO REPORT IMMEDIATELY:    Following upper endoscopy (EGD)  Vomiting of blood or coffee ground material  New chest pain or pain under the shoulder blades  Painful or persistently difficult swallowing  New shortness of breath  Fever of 100F or higher  Black, tarry-looking stools  For urgent or emergent issues, a gastroenterologist can be reached at any hour by calling [phone].   DIET:  We do recommend a small meal at first, but then you may proceed to your regular diet.  Drink plenty of fluids but you should avoid alcoholic beverages for 24 hours.  MEDICATIONS: Increase Dexilant to 30mg  twice daily by mouth for 2 months to if we can improve the inflammatory process currently occurring in you stomach. Minimize  your use of NSAIDs (non-steroidal anti-inflammatory drugs) such as Ibuprofen, Motrin, Alleve, Naproxen, and Aspirin.  Please see handouts given to you by your recovery nurse.  Follow Up: Return to Dr. Donneta Romberg office in 4 weeks if your symptoms are persisting as he will need to discuss the possible role of EUS cystgastrostomy, though not clear in setting of not having early satiety/nausea/vomiting/persistent pain that this is playing a role currently.  ACTIVITY:  You should plan to take it easy for the rest of today and you should NOT DRIVE or use heavy machinery until tomorrow (because of the sedation medicines used during the test).    FOLLOW UP: Our staff will call the number listed on your records 48-72 hours following your procedure to check on you and address any questions or concerns that you may have regarding the information given to you following your procedure. If we do not reach you, we will leave a message.  We will attempt to reach you two times.  During this call, we will ask if you have developed any symptoms of COVID 19. If you develop any symptoms (ie: fever, flu-like symptoms, shortness of breath, cough etc.) before then, please call [phone].  If you test positive for Covid 19 in the 2 weeks post procedure, please call and report this information to [place].    If any biopsies were taken you will be contacted by phone or by letter within the next 1-3 weeks.  Please call us at [phone] if you have not heard about the biopsies in 3 weeks.   Thank you for allowing [place] to provide for your healthcare needs today.   SIGNATURES/CONFIDENTIALITY: You and/or your care partner have signed paperwork which will be entered into your electronic medical record.  These signatures attest to the fact that that the information above on your After Visit Summary has been reviewed and is understood.  Full responsibility of the confidentiality of this discharge information lies with you and/or  your care-partner.

## 2019-07-07 NOTE — Progress Notes (Signed)
Called to room to assist during endoscopic procedure.  Patient ID and intended procedure confirmed with present staff. Received instructions for my participation in the procedure from the performing physician.  

## 2019-07-08 ENCOUNTER — Ambulatory Visit (HOSPITAL_COMMUNITY): Payer: PPO

## 2019-07-11 ENCOUNTER — Telehealth: Payer: Self-pay

## 2019-07-11 NOTE — Telephone Encounter (Signed)
  Follow up Call-  Call back number 07/07/2019  Post procedure Call Back phone  # [phone]  Permission to leave phone message Yes  Some recent data might be hidden     Patient questions:  Do you have a fever, pain , or abdominal swelling? No. Pain Score  0 *  Have you tolerated food without any problems? Yes.    Have you been able to return to your normal activities? Yes.    Do you have any questions about your discharge instructions: Diet   No. Medications  Yes-email sent to MD [facility] Follow up visit  No.  Do you have questions or concerns about your Care? No.  Actions: * If pain score is 4 or above: No action needed, pain <4.  1. Have you developed a fever since your procedure? no  2.   Have you had an respiratory symptoms (SOB or cough) since your procedure? no  3.   Have you tested positive for COVID 19 since your procedure no  4.   Have you had any family members/close contacts diagnosed with the COVID 19 since your procedure?  no   If yes to any of these questions please route to Joylene John, RN and Alphonsa Gin, Therapist, sports.

## 2019-07-12 ENCOUNTER — Encounter: Payer: Self-pay | Admitting: Gastroenterology

## 2019-07-12 ENCOUNTER — Other Ambulatory Visit: Payer: Self-pay

## 2019-07-12 LAB — COLOGUARD: Cologuard: NEGATIVE

## 2019-07-12 NOTE — Progress Notes (Signed)
I have had an opportunity to review the CT scan and at the endoscopy which was scheduled by PA Zehr, that occurred on 12/3 (today) I had an opportunity to discuss the results with the patient and his wife.  It is not clear to me that his symptoms are consistent with the pancreatic fluid collection/pancreatic pseudocyst causing his symptoms.  His EGD had some remarkable findings and we await pathology.  Depending on the final pathology and how he does with increased PPI we will consider the role of a pancreatic cyst gastrostomy but I am hopeful that we will not need to do that.  FYI PA Zehr  Justice Britain, MD [facility] Gastroenterology Advanced Endoscopy Office # [phone]

## 2019-07-14 ENCOUNTER — Encounter: Payer: Self-pay | Admitting: Gastroenterology

## 2019-07-25 DIAGNOSIS — I10 Essential (primary) hypertension: Secondary | ICD-10-CM | POA: Diagnosis not present

## 2019-07-25 DIAGNOSIS — R454 Irritability and anger: Secondary | ICD-10-CM | POA: Diagnosis not present

## 2019-07-25 DIAGNOSIS — E119 Type 2 diabetes mellitus without complications: Secondary | ICD-10-CM | POA: Diagnosis not present

## 2019-07-25 DIAGNOSIS — K219 Gastro-esophageal reflux disease without esophagitis: Secondary | ICD-10-CM | POA: Diagnosis not present

## 2019-07-26 DIAGNOSIS — I10 Essential (primary) hypertension: Secondary | ICD-10-CM | POA: Diagnosis not present

## 2019-07-26 DIAGNOSIS — E119 Type 2 diabetes mellitus without complications: Secondary | ICD-10-CM | POA: Diagnosis not present

## 2019-07-26 DIAGNOSIS — E785 Hyperlipidemia, unspecified: Secondary | ICD-10-CM | POA: Diagnosis not present

## 2019-08-09 DIAGNOSIS — I10 Essential (primary) hypertension: Secondary | ICD-10-CM | POA: Diagnosis not present

## 2019-08-09 DIAGNOSIS — E119 Type 2 diabetes mellitus without complications: Secondary | ICD-10-CM | POA: Diagnosis not present

## 2019-08-09 DIAGNOSIS — Z1331 Encounter for screening for depression: Secondary | ICD-10-CM | POA: Diagnosis not present

## 2019-08-09 DIAGNOSIS — F321 Major depressive disorder, single episode, moderate: Secondary | ICD-10-CM | POA: Diagnosis not present

## 2019-08-09 DIAGNOSIS — F411 Generalized anxiety disorder: Secondary | ICD-10-CM | POA: Diagnosis not present

## 2019-08-09 DIAGNOSIS — R454 Irritability and anger: Secondary | ICD-10-CM | POA: Diagnosis not present

## 2019-08-12 IMAGING — DX DG CHEST 1V PORT
1 series · 1 of 1 positions shown · non-contrast
Comparison: 01/20/2018

CLINICAL DATA: Short of breath and abdominal pain.  Pancreatitis.

EXAM:
PORTABLE CHEST 1 VIEW

[chest ap]
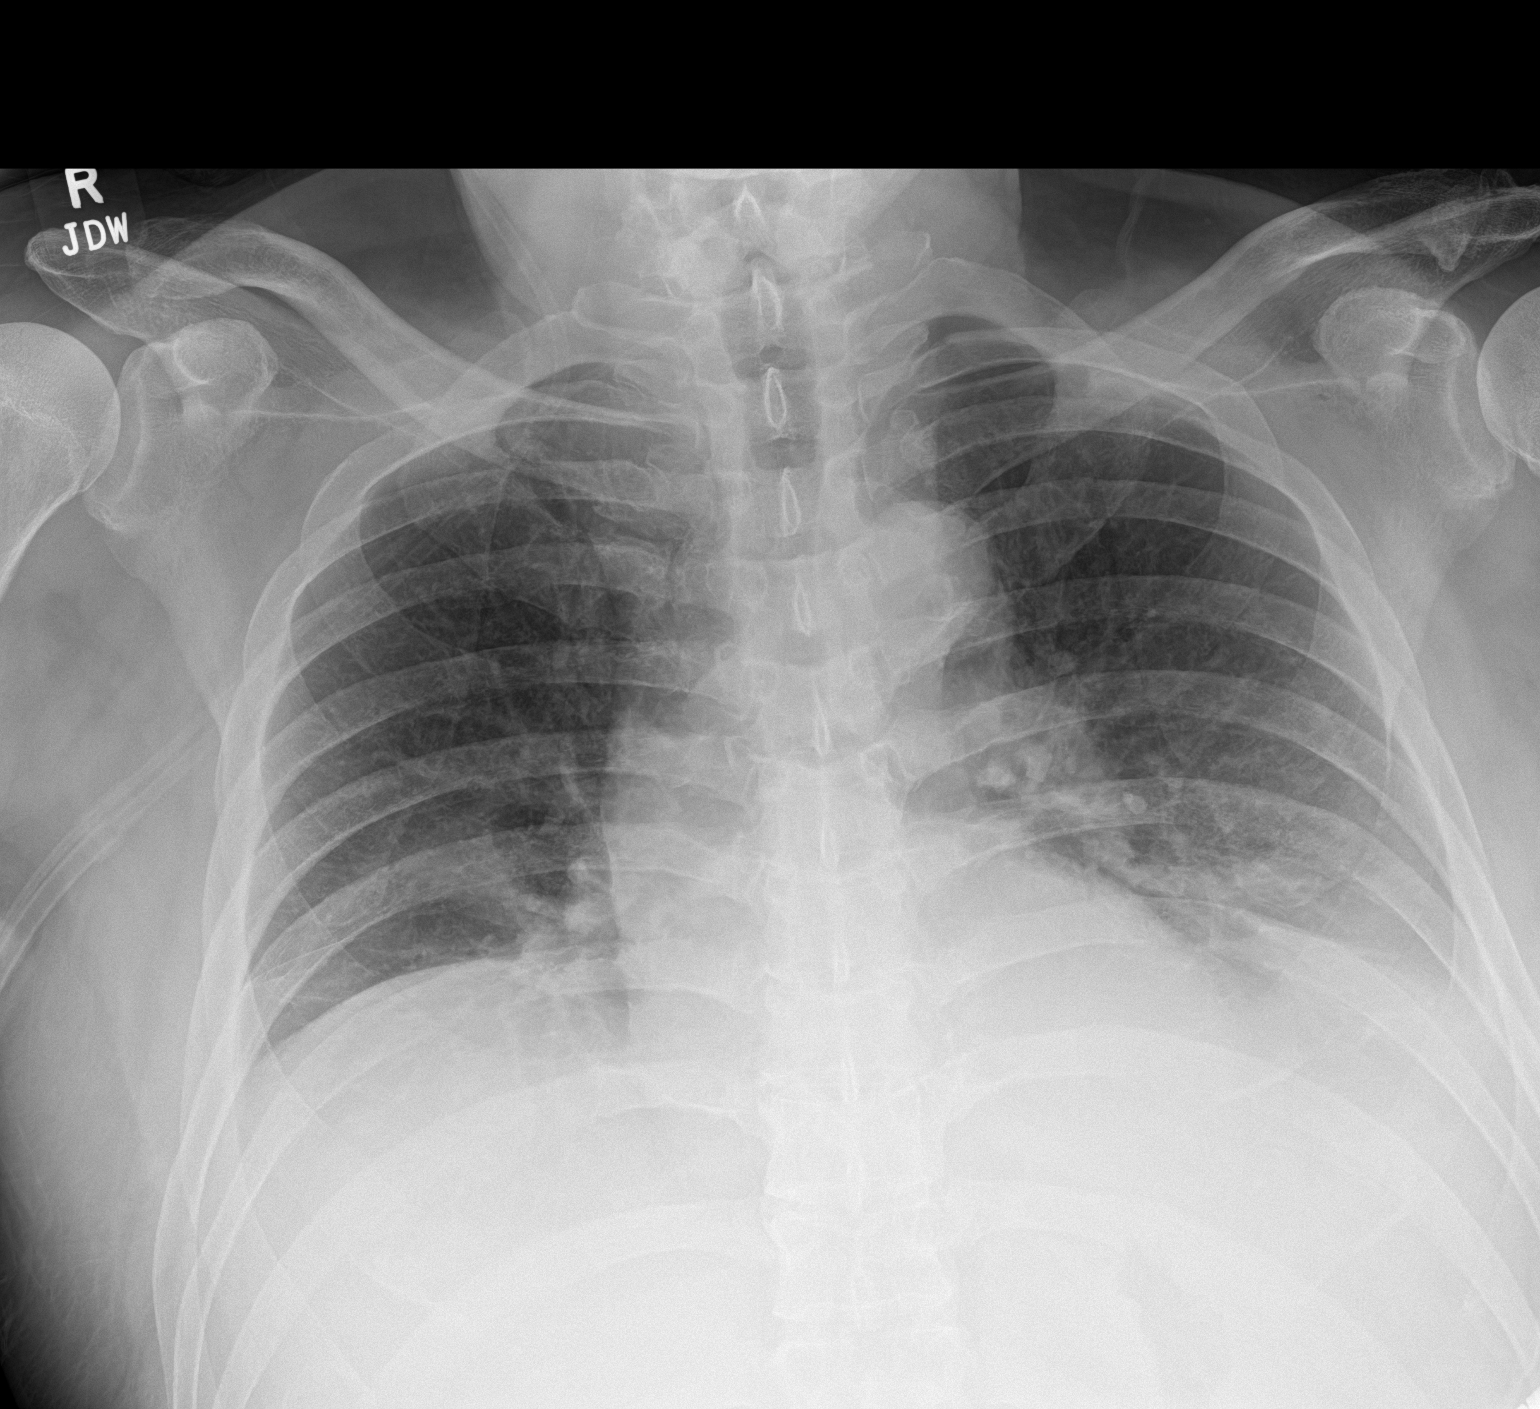

[1 of 1 positions shown; findings below may reference images not displayed]

FINDINGS: Midline trachea. Normal heart size for level of inspiration. Aortic
atherosclerosis. Probable layering small left pleural effusion. No
pneumothorax. Low lung volumes with resultant pulmonary interstitial
prominence. Left greater than right base airspace disease is new.
IMPRESSION: Low lung volumes with probable small left pleural effusion and
bibasilar airspace disease. Airspace disease is favored to represent
atelectasis. Especially at the left lung base, infection or
aspiration cannot be excluded

Aortic Atherosclerosis (LPNAF-C13.3).

## 2019-08-12 IMAGING — MR MR 3D RECON AT SCANNER
18 series · 18 of 18 positions shown · IV contrast (multihance)
Comparison: Abdominal CT 03/16/2018 and 01/25/2008.

CLINICAL DATA: Acute pancreatitis, etiology unknown.
Cholelithiasis, diabetes and hepatitis C.

EXAM:
MRI ABDOMEN WITHOUT AND WITH CONTRAST (INCLUDING MRCP)
TECHNIQUE: Multiplanar multisequence MR imaging of the abdomen was performed
both before and after the administration of intravenous contrast.
Heavily T2-weighted images of the biliary and pancreatic ducts were
obtained, and three-dimensional MRCP images were rendered by post
processing.
CONTRAST:  15mL MULTIHANCE GADOBENATE DIMEGLUMINE 529 MG/ML IV SOLN

[Series 3: T2 fat-sat · axial · 5.0mm · 0.78mm/px · 1 of 45 slices shown]
[im 1/45]
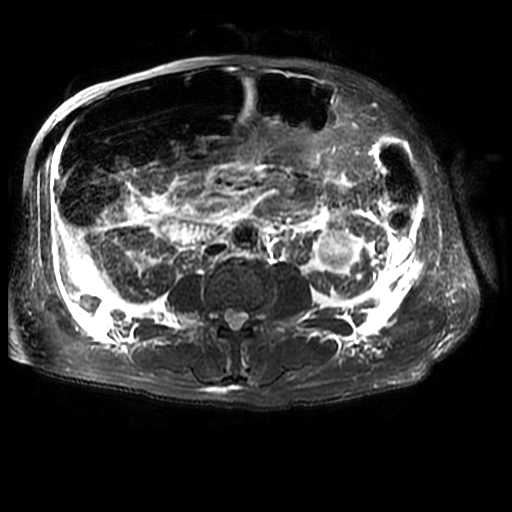

[Series 4: T2 · axial · 5.0mm · 0.78mm/px · 1 of 51 slices shown (1 of 2)]
[im 1/51]
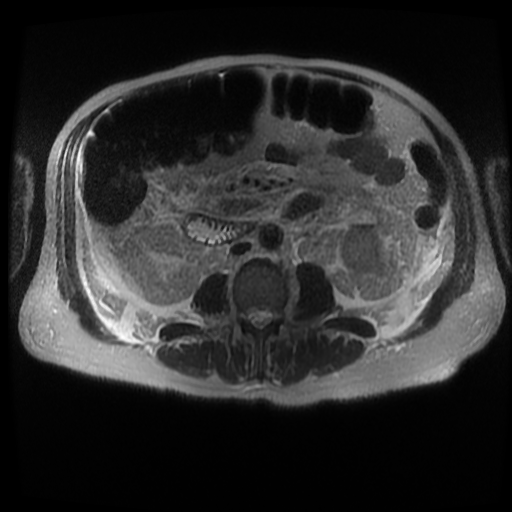

[Series 5: T2 · coronal · 5.0mm · 0.78mm/px · 1 of 44 slices shown (2 of 2)]
[im 1/44]
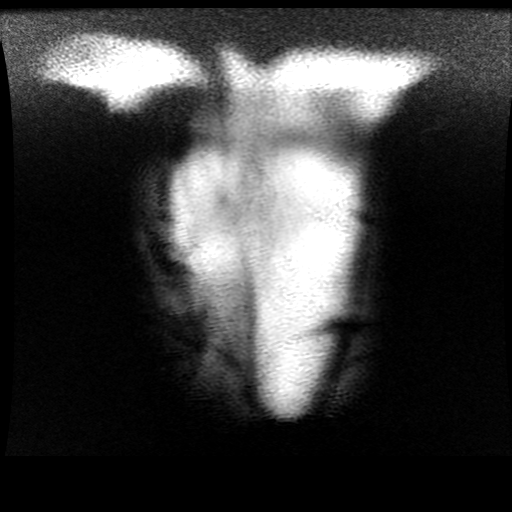

[Series 6: MRCP · coronal · 1.6mm · 0.62mm/px · 1 of 100 slices shown (1 of 3)]
[im 1/100]
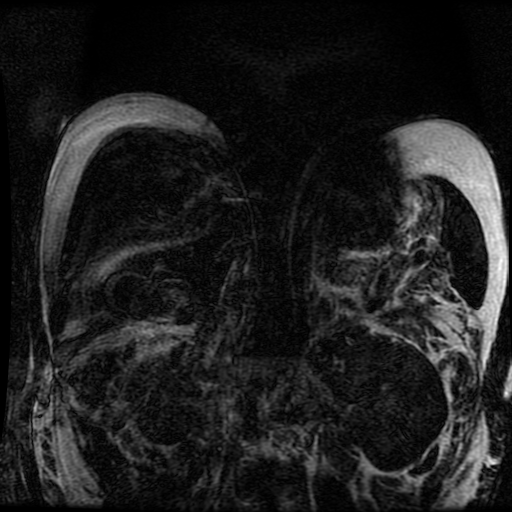

[Series 8: MRCP · coronal · 2.0mm · 0.70mm/px · 1 of 47 slices shown (2 of 3)]
[im 1/47]
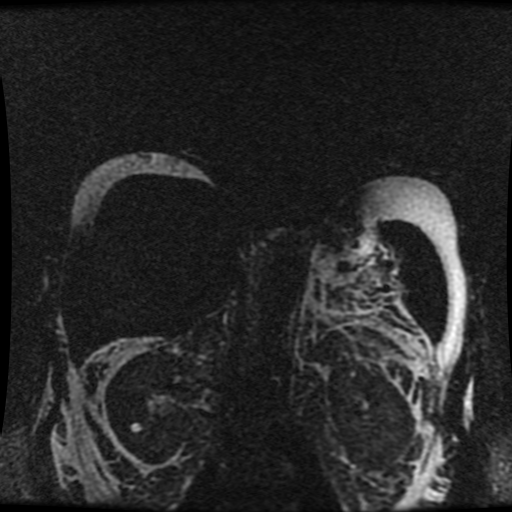

[Series 9: DWI b500 · axial · 6.0mm · 1.64mm/px · 1 of 59 slices shown]
[im 1/59]
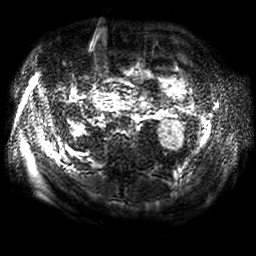

[Series 12: ax dualecho · axial · 5.0mm · 0.78mm/px · 1 of 96 slices shown]
[im 1/96]
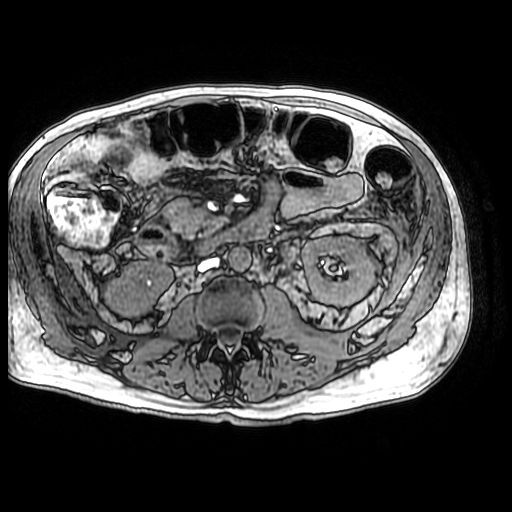

[Series 13: MRCP · coronal · 50.0mm · 0.70mm/px · 1 of 6 slices shown (3 of 3)]
[im 1/6]
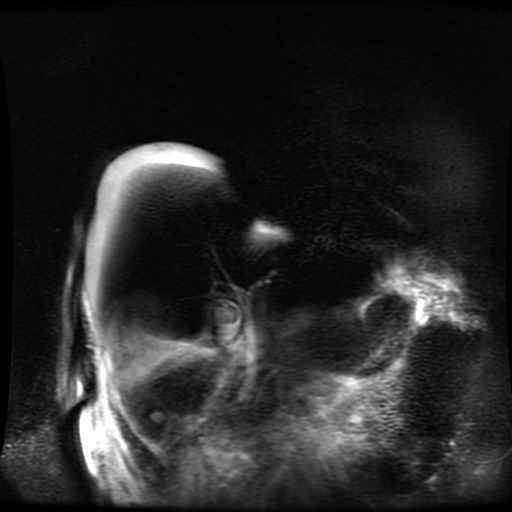

[Series 15: T1 dynamic post-contrast · coronal · 5.0mm · 0.78mm/px · 1 of 96 slices shown]
[im 1/96]
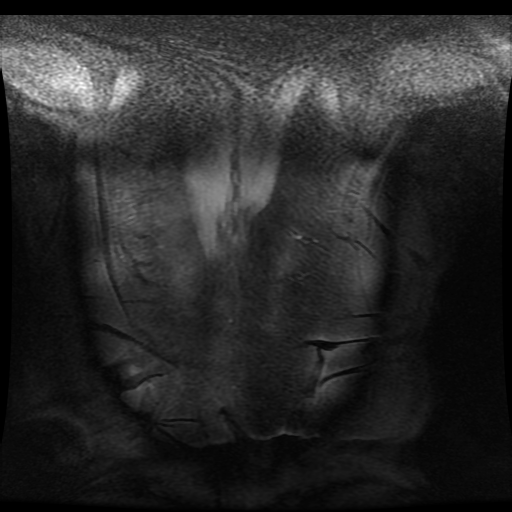

[Series 1400: T1 dynamic · axial · 5.0mm · 0.78mm/px · 1 of 96 slices shown (1 of 5)]
[im 1/96]
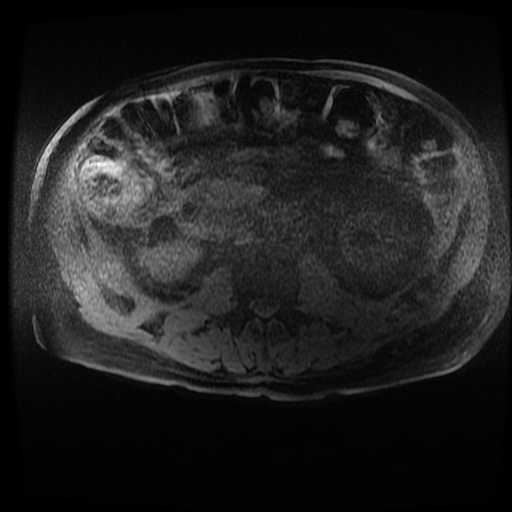

[Series 1401: T1 dynamic · axial · 5.0mm · 0.78mm/px · 1 of 96 slices shown (2 of 5)]
[im 1/96]
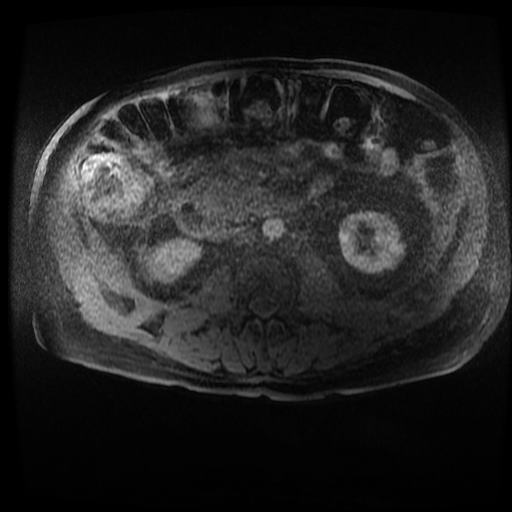

[Series 1402: T1 dynamic · axial · 5.0mm · 0.78mm/px · 1 of 96 slices shown (3 of 5)]
[im 1/96]
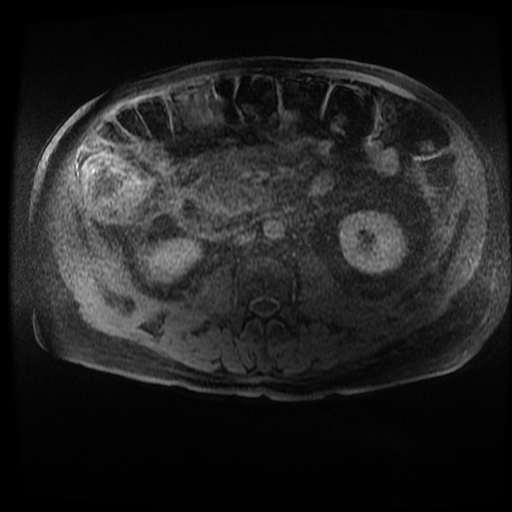

[Series 1403: T1 dynamic · axial · 5.0mm · 0.78mm/px · 1 of 96 slices shown (4 of 5)]
[im 1/96]
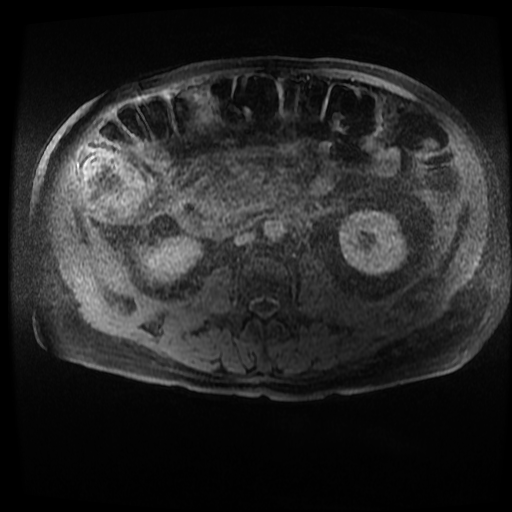

[Series 1404: T1 dynamic · axial · 5.0mm · 0.78mm/px · 1 of 96 slices shown (5 of 5)]
[im 1/96]
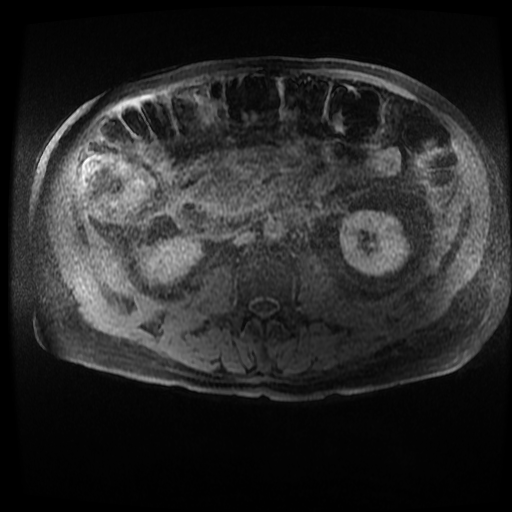

[((id)/(id)/1)-((id)/(id)/1) · axial · 5.0mm · 0.78mm/px · 1 of 93 slices shown (1 of 4)]
[im 1/93]
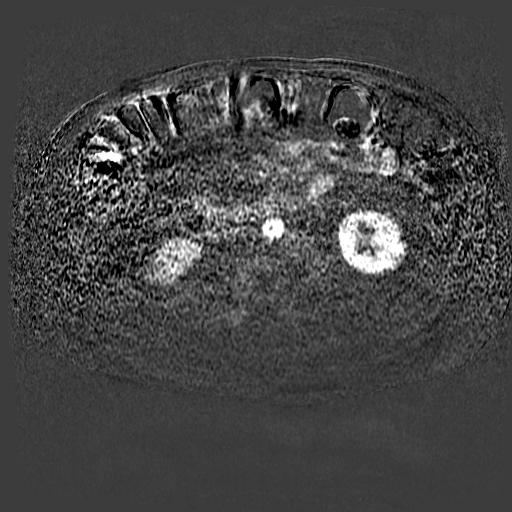

[((id)/(id)/1)-((id)/(id)/1) · axial · 5.0mm · 0.78mm/px · 1 of 92 slices shown (2 of 4)]
[im 1/92]
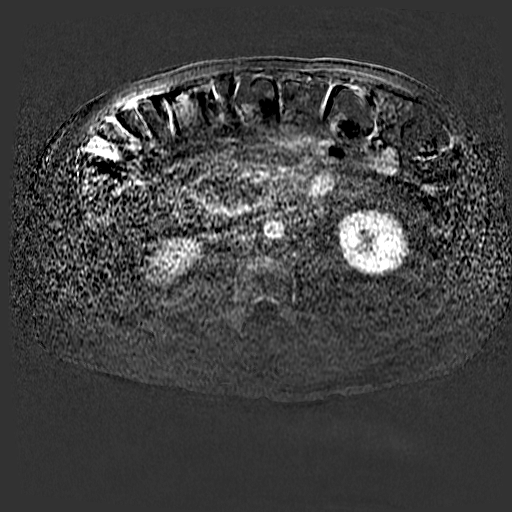

[((id)/(id)/1)-((id)/(id)/1) · axial · 5.0mm · 0.78mm/px · 1 of 94 slices shown (3 of 4)]
[im 1/94]
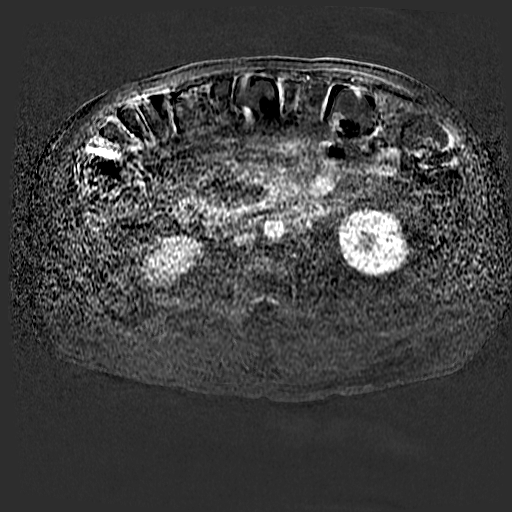

[((id)/(id)/1)-((id)/(id)/1) · axial · 5.0mm · 0.78mm/px · 1 of 93 slices shown (4 of 4)]
[im 1/93]
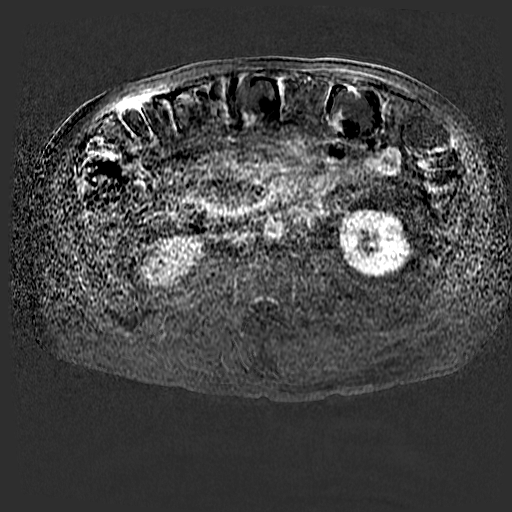

[18 of 18 positions shown; findings below may reference images not displayed]

FINDINGS: Lower chest: New small bilateral pleural effusions with associated
subsegmental atelectasis at both lung bases.

Hepatobiliary: The liver is normal in appearance without morphologic
changes of cirrhosis, focal lesion or abnormal enhancement. Large
gallstones are again noted. The common hepatic duct measures 7 mm in
diameter. There is no evidence of choledocholithiasis.

Pancreas: There is moderate diffuse enlargement of the pancreas with
extensive surrounding inflammatory changes. Prior to contrast, there
is heterogeneous increased T1 signal throughout the pancreatic body
and tail, suspicious for hemorrhage. Following contrast, there is
very poor enhancement of the pancreas, especially in the body and
tail. Portions of the pancreatic head enhance normally. There is no
pancreatic ductal dilatation. Normal pancreatic ductal anatomy.

Spleen: Normal in size without focal abnormality.

Adrenals/Urinary Tract: Both adrenal glands appear normal. Small
bilateral renal cysts. No evidence of renal mass or hydronephrosis.

Stomach/Bowel: No evidence of bowel wall thickening or focal
inflammation. Stable mild colonic dilatation, most consistent with
an ileus.

Vascular/Lymphatic: There are no enlarged abdominal lymph nodes. No
significant vascular findings. The portal, superior mesenteric and
splenic veins are patent.

Other: There is a moderate amount of ascites. There is extensive
inflammation and ill-defined fluid throughout the retroperitoneal
and mesenteric fat. No well-defined fluid collections are
demonstrated.

Musculoskeletal: No acute or significant osseous findings.
IMPRESSION: 1. Worsening severe acute pancreatitis with suspicion of hemorrhagic
components. There is a very poor enhancement of most of the
pancreas, consistent with necrotizing pancreatitis and implying a
poor prognosis.
2. Associated increased ascites, inflammation and ill-defined fluid
throughout the intra-abdominal fat, small bilateral pleural
effusions and bibasilar atelectasis.
3. Cholelithiasis without evidence of significant biliary dilatation
or choledocholithiasis.

## 2019-08-16 ENCOUNTER — Ambulatory Visit: Payer: PPO | Admitting: Gastroenterology

## 2019-08-16 ENCOUNTER — Encounter: Payer: Self-pay | Admitting: Gastroenterology

## 2019-08-16 VITALS — BP 154/80 | HR 60 | Temp 96.8°F | Ht 65.0 in | Wt 178.1 lb

## 2019-08-16 DIAGNOSIS — R935 Abnormal findings on diagnostic imaging of other abdominal regions, including retroperitoneum: Secondary | ICD-10-CM | POA: Diagnosis not present

## 2019-08-16 DIAGNOSIS — K219 Gastro-esophageal reflux disease without esophagitis: Secondary | ICD-10-CM | POA: Diagnosis not present

## 2019-08-16 DIAGNOSIS — B182 Chronic viral hepatitis C: Secondary | ICD-10-CM

## 2019-08-16 DIAGNOSIS — Z8719 Personal history of other diseases of the digestive system: Secondary | ICD-10-CM

## 2019-08-16 DIAGNOSIS — K863 Pseudocyst of pancreas: Secondary | ICD-10-CM

## 2019-08-16 NOTE — Progress Notes (Signed)
GASTROENTEROLOGY OUTPATIENT CLINIC VISIT   Primary Care Provider Mikael Spray, NP [address] [place] [address] [phone]  Patient Profile: Ryan Chan is a 69 y.o. male with a pmh significant for recent necrotizing gallstone pancreatitis complicated by formation of walled off necrosis/pseudocyst, hypertension, chronic HCV infection (untreated and 2020 Fibrosure suggesting F2 fibrosis), diabetes.  The patient presents to the [facility] for an evaluation and management of problem(s) noted below:  Problem List 1. History of pancreatitis   2. Pancreatic pseudocyst   3. Gastroesophageal reflux disease without esophagitis   4. Chronic hepatitis C without hepatic coma (HCC)   5. Abnormal CT of the abdomen     History of Present Illness: Please see prior consultation note and progress notes for full details of HPI.  Interval History The patient returns for scheduled follow-up post recent endoscopy in December.  He is feeling well.  No significant return of symptoms.  He is not clear that he wants to undergo an a more significant drainage procedure if he is feeling well.  No nausea or vomiting.  Weight is stable.  No significant abdominal pain.  His GERD symptoms are well controlled on Dexilant once daily which is refilled by his primary care provider.  The patient denies any issues with jaundice, scleral icterus, pruritus, darkened/amber urine, clay-colored stools, LE edema, hematemesis, coffee-ground emesis, abdominal distention, confusion, new generalized pruritus.  GI Review of Systems Positive as above Negative for abdominal pain, nausea, vomiting, early satiety, anorexia, dysphagia, odynophagia, change in bowel habits, melena, hematochezia  Review of Systems General: Denies fevers/chills Cardiovascular: Denies chest pain or palpitations Pulmonary: Denies shortness of breath Gastroenterological: See HPI Genitourinary: Denies  darkened urine Dermatological: Denies jaundice Psychological: Mood is stable   Medications Current Outpatient Medications  Medication Sig Dispense Refill  . atorvastatin (LIPITOR) 10 MG tablet Take 1 tablet by mouth daily.    Marland Kitchen Dexlansoprazole (DEXILANT) 30 MG capsule Take 1 capsule (30 mg total) by mouth 2 (two) times daily. Increase Dexilant to 30 mg by mouth twice daily for the next 2 months, then follow up with Dr. Meridee Score to let him know how you are doing. 120 capsule 1  . escitalopram (LEXAPRO) 5 MG tablet Take 5 mg by mouth daily.    . hydrOXYzine (ATARAX/VISTARIL) 10 MG tablet Take 10 mg by mouth 2 (two) times daily as needed.    . insulin glargine (LANTUS) 100 unit/mL SOPN Inject 20 Units into the skin at bedtime as needed (only if BGL is 200 or greater).     . Insulin Pen Needle (EXEL COMFORT POINT PEN NEEDLE) 29G X MISC Inject 20 Units into the skin daily.    . metFORMIN (GLUCOPHAGE) 500 MG tablet Take 1 tablet by mouth daily.    . metoprolol succinate (TOPROL-XL) 25 MG 24 hr tablet Take 25 mg by mouth daily.  3  . nitroGLYCERIN (NITROSTAT) 0.4 MG SL tablet Place 0.4 mg under the tongue every 5 (five) minutes as needed for chest pain.   0   Current Facility-Administered Medications  Medication Dose Route Frequency Provider Last Rate Last Admin  . 0.9 %  sodium chloride infusion  500 mL Intravenous Once Mansouraty, Netty Starring., MD        Allergies No Known Allergies  Histories Past Medical History:  Diagnosis Date  . Diabetes mellitus without complication (HCC)   . Essential hypertension   . GERD (gastroesophageal reflux disease)   . Hepatitis C   .  History of kidney stones   . Necrotizing pancreatitis    Past Surgical History:  Procedure Laterality Date  . BACK SURGERY    . CHOLECYSTECTOMY N/A 05/19/2018   Procedure: LAPAROSCOPIC CHOLECYSTECTOMY WITH INTRAOPERATIVE CHOLANGIOGRAM;  Surgeon: Emelia Loron, MD;  Location: [facility] 2 OR;  Service: General;   Laterality: N/A;   Social History   Socioeconomic History  . Marital status: Married    Spouse name: Not on file  . Number of children: Not on file  . Years of education: Not on file  . Highest education level: Not on file  Occupational History  . Occupation: Ecologist work  Tobacco Use  . Smoking status: Current Every Day Smoker    Packs/day: 2.00    Years: 54.00    Pack years: 108.00    Types: Cigarettes  . Smokeless tobacco: Never Used  Substance and Sexual Activity  . Alcohol use: Not Currently  . Drug use: Not Currently  . Sexual activity: Not on file  Other Topics Concern  . Not on file  Social History Narrative  . Not on file   Social Determinants of Health   Financial Resource Strain:   . Difficulty of Paying Living Expenses: Not on file  Food Insecurity:   . Worried About Programme researcher, broadcasting/film/video in the Last Year: Not on file  . Ran Out of Food in the Last Year: Not on file  Transportation Needs:   . Lack of Transportation (Medical): Not on file  . Lack of Transportation (Non-Medical): Not on file  Physical Activity:   . Days of Exercise per Week: Not on file  . Minutes of Exercise per Session: Not on file  Stress:   . Feeling of Stress : Not on file  Social Connections:   . Frequency of Communication with Friends and Family: Not on file  . Frequency of Social Gatherings with Friends and Family: Not on file  . Attends Religious Services: Not on file  . Active Member of Clubs or Organizations: Not on file  . Attends Banker Meetings: Not on file  . Marital Status: Not on file  Intimate Partner Violence:   . Fear of Current or Ex-Partner: Not on file  . Emotionally Abused: Not on file  . Physically Abused: Not on file  . Sexually Abused: Not on file   Family History  Problem Relation Age of Onset  . GI Disease Neg Hx   . Colon cancer Neg Hx   . Esophageal cancer Neg Hx   . Pancreatic cancer Neg Hx   . Stomach cancer Neg Hx   .  Liver disease Neg Hx   . Inflammatory bowel disease Neg Hx   . Rectal cancer Neg Hx    I have reviewed his medical, social, and family history in detail and updated the electronic medical record as necessary.    PHYSICAL EXAMINATION  BP (!) 154/80   Pulse 60   Temp (!) 96.8 F (36 C)   Ht 5\' 5"  (1.651 m)   Wt 178 lb 2 oz (80.8 kg)   BMI 29.64 kg/m  Wt Readings from Last 3 Encounters:  08/16/19 178 lb 2 oz (80.8 kg)  07/07/19 178 lb (80.7 kg)  06/23/19 178 lb (80.7 kg)  GEN: NAD, appears stated age, doesn't appear chronically ill PSYCH: Cooperative, without pressured speech EYE: Conjunctivae pink, sclerae anicteric ENT: MMM CV: RR without R/Gs  RESP: CTAB posteriorly, without wheezing present GI: NABS, soft, nontender, nondistended, no hepatosplenomegaly appreciated  MSK/EXT: Trace bilateral lower extremity edema SKIN: No jaundice, no spider angiomata present on upper thorax NEURO:  Alert & Oriented x 3, no focal deficits   REVIEW OF DATA  I reviewed the following data at the time of this encounter:  GI Procedures and Studies  None to review  Laboratory Studies  Reviewed in epic  Imaging Studies  November 2020 CT abdomen pelvis with contrast IMPRESSION: 1. No acute findings. 2. Mild decrease in size of pancreatic pseudocyst along dorsal aspect of body and tail. 3. Tiny nonobstructing bilateral renal calculi.   ASSESSMENT  Ryan Chan is a 69 y.o. male with a pmh significant for recent necrotizing gallstone pancreatitis complicated by formation of walled off necrosis/pseudocyst, hypertension, chronic HCV infection (untreated and 2020 Fibrosure suggesting F2 fibrosis), diabetes.  The patient is seen today for evaluation and management of:  1. History of pancreatitis   2. Pancreatic pseudocyst   3. Gastroesophageal reflux disease without esophagitis   4. Chronic hepatitis C without hepatic coma ([place])   5. Abnormal CT of the abdomen    The patient is clinically  and hemodynamically stable.  Seems to be doing well post EGD.  No significant recurrence of his discomfort that he was having prior to his endoscopy.  Is not clear to me that the persistent pancreatic pseudocyst is an issue at this time.  We will certainly consider drainage if symptoms of severe abdominal pain/anorexia/weight loss/early satiety/nausea and vomiting were to occur.  Overall patient wants to minimize invasive procedures if possible.  I plan to reevaluate the cyst in approximately 6 months from his last imaging study and will see him in clinic for that.  All patient questions were answered, to the best of my ability, and the patient agrees to the aforementioned plan of action with follow-up as indicated. After the patient's clinic visit was over, I reviewed the rest of his laboratory findings and history.  Although, he is not expressed desire for hepatitis C treatment in the past, as I evaluate his fibrosure suggesting F2 fibrosis I do think it is worthwhile for [place] to consider further discussion with the patient in the coming months as to whether he would want to have potential hepatitis C treatment.  We will need a genotype.  We will try to reach the patient later this week via telephone or try to set up an earlier clinic visit to discuss things in the next 1 to 2 months rather than waiting till May.  We will work on getting that scheduled.  I think it would be a good candidate for antiviral therapy.  He has not obtained his hepatitis A or hepatitis B serologies to know whether he may benefit from vaccination/immunization.   PLAN  Will have patient return in coming weeks for hepatitis serologies to understand and have a/hep B immunization status Will need hepatitis C genotype to be obtained Will try to have a telephone discussion or earlier clinic visit to discuss hepatitis C and liver disease solely CT abdomen pelvis in May 2021 with follow-up clinic visit after to discuss any changes  clinically or whether imaging studies suggest need for drainage of pseudocyst Earlier follow-up if patient develops symptoms concerning for cyst infection or GI symptoms are related to that   No orders of the defined types were placed in this encounter.   New Prescriptions   No medications on file   Modified Medications   No medications on file    Planned Follow Up: No follow-ups on  file.   Total Time in Face-to-Face and in Coordination of Care for patient including review/personal interpretation of prior testing, medical history, examination, medication adjustment, documentation with the EHR is greater than 30 minutes.   Corliss Parish, MD [facility] Advanced Endoscopy Office # [phone]

## 2019-08-16 NOTE — Patient Instructions (Signed)
If you are age 69 or older, your body mass index should be between 23-30. Your Body mass index is 29.64 kg/m. If this is out of the aforementioned range listed, please consider follow up with your Primary Care Provider.  You will need CT scan abd/pelvis with contrast in May 2021. We will contact you at a later time to schedule.   You will also need a follow-up visit 1-2 weeks after your CT. We will also schedule that a later time.   You will need Cologuard in 2023.  Thank you for choosing me and [facility].  Dr. Meridee Score

## 2019-08-17 ENCOUNTER — Telehealth: Payer: Self-pay | Admitting: Gastroenterology

## 2019-08-17 DIAGNOSIS — R935 Abnormal findings on diagnostic imaging of other abdominal regions, including retroperitoneum: Secondary | ICD-10-CM | POA: Insufficient documentation

## 2019-08-17 DIAGNOSIS — B182 Chronic viral hepatitis C: Secondary | ICD-10-CM

## 2019-08-17 DIAGNOSIS — K219 Gastro-esophageal reflux disease without esophagitis: Secondary | ICD-10-CM | POA: Insufficient documentation

## 2019-08-17 IMAGING — CT CT ABD-PELV W/ CM
2 of 5 series · 15 of 46 positions shown, 17 images · IV contrast (Omni 300)
Comparison: 04/02/2018 CT abdomen/pelvis.  03/17/2018 MRI abdomen.

CLINICAL DATA: Inpatient.  Necrotizing gallstone pancreatitis.

EXAM:
CT ABDOMEN AND PELVIS WITH CONTRAST
TECHNIQUE: Multidetector CT imaging of the abdomen and pelvis was performed
using the standard protocol following bolus administration of
intravenous contrast.
CONTRAST:  100mL [id number] IOPAMIDOL (CEOG3C-YHH) INJECTION 61%

[Series 3: a/p w/ 5mm · axial · 0.75mm/px · z∈[+843,+1288]mm · 12 of 101 slices shown, 14 images]
[im 6/101  soft-tissue]
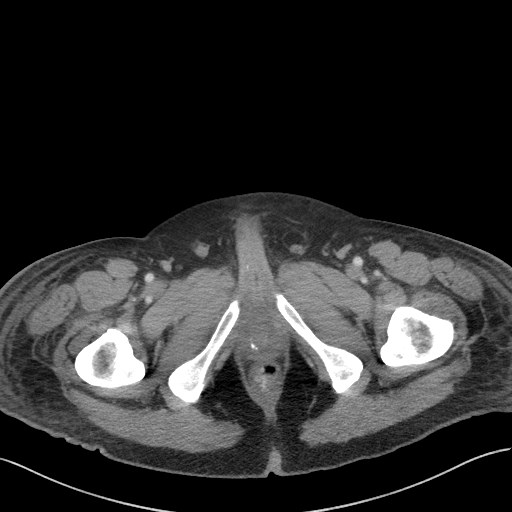
[im 6/101  bone]
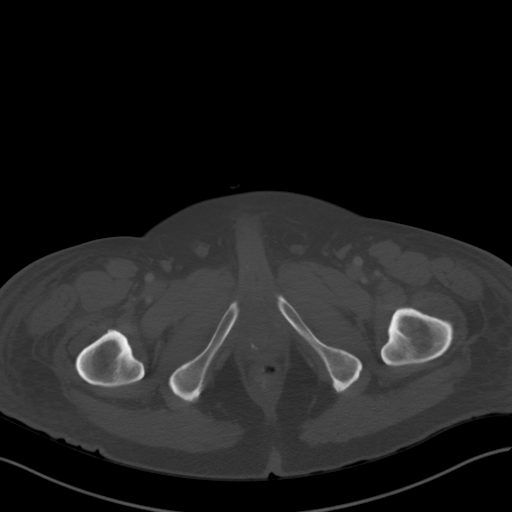
[im 16/101  soft-tissue]
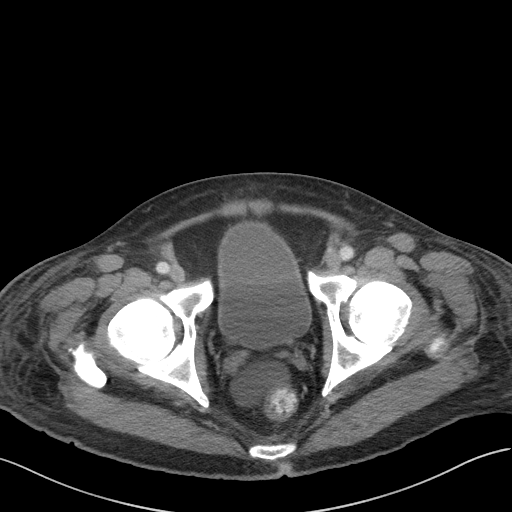
[im 22/101  soft-tissue]
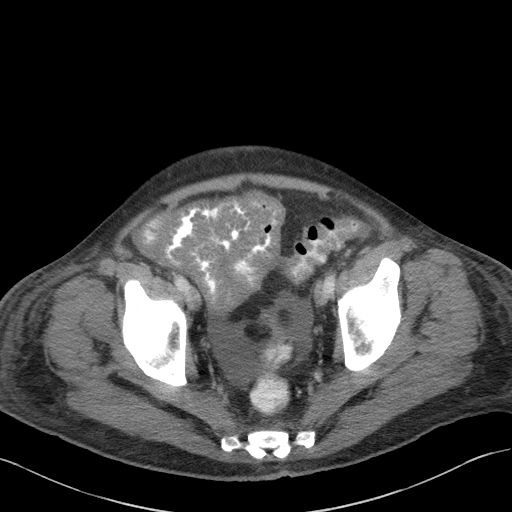
[im 32/101  soft-tissue]
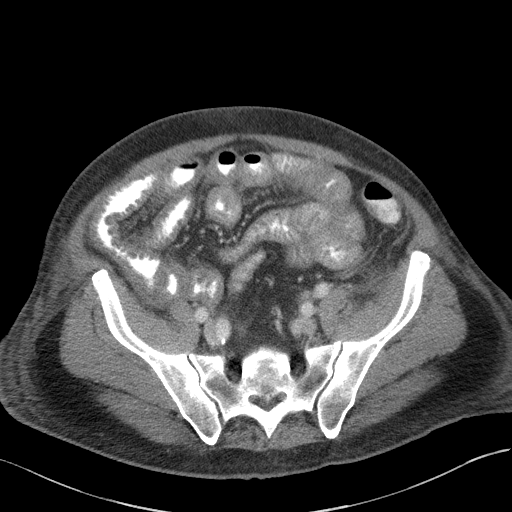
[im 37/101  soft-tissue]
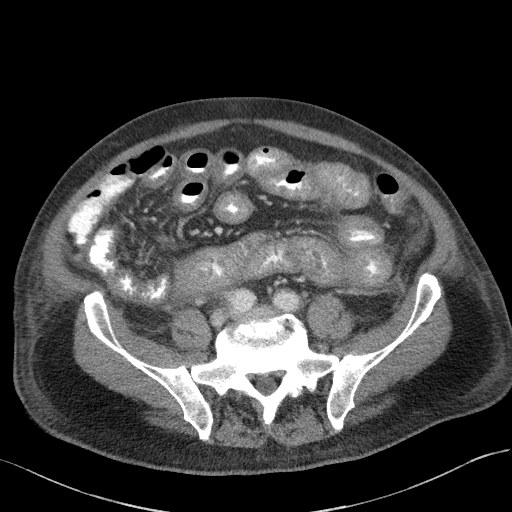
[im 48/101  soft-tissue]
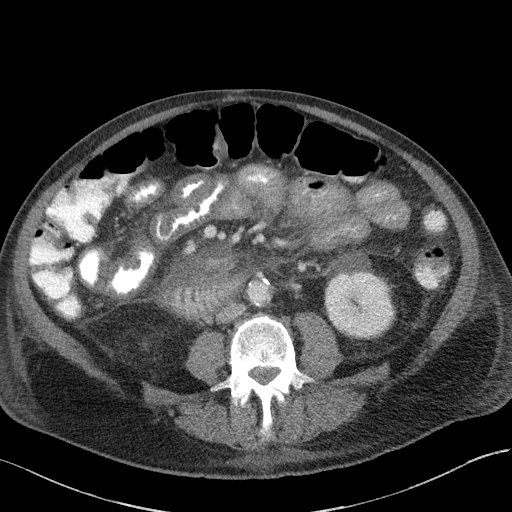
[im 53/101  soft-tissue]
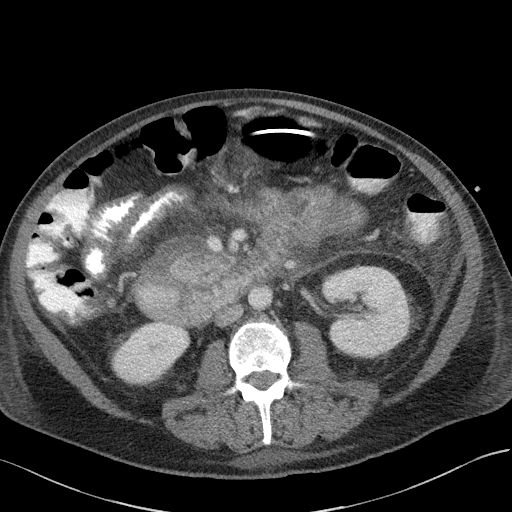
[im 64/101  soft-tissue]
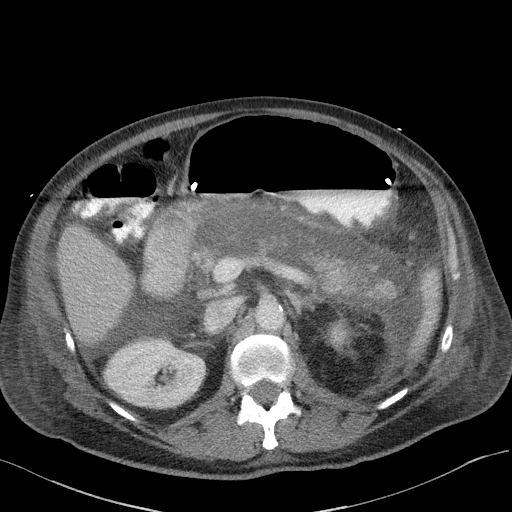
[im 69/101  soft-tissue]
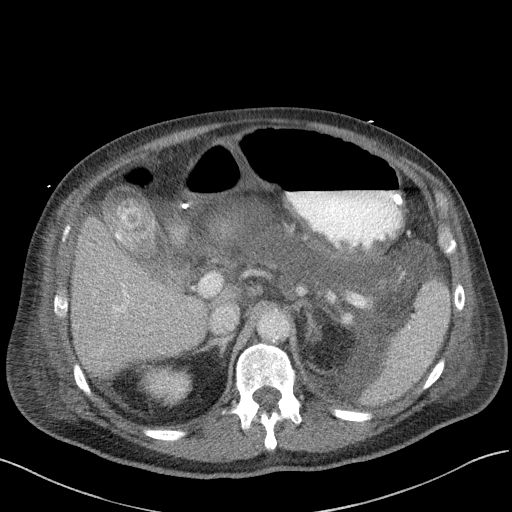
[im 69/101  bone]
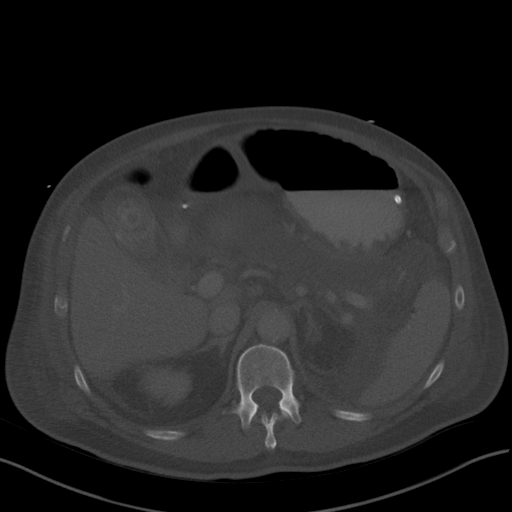
[im 79/101  soft-tissue]
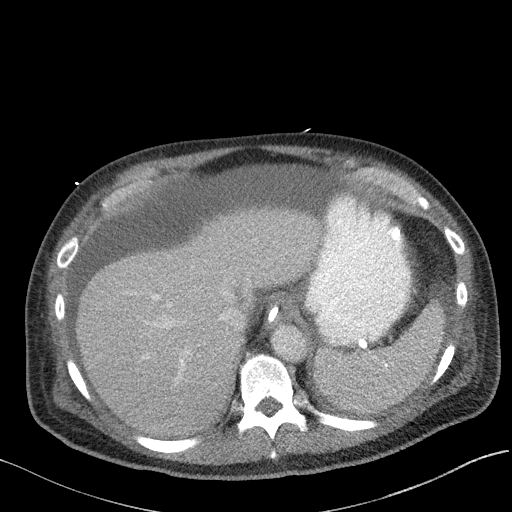
[im 85/101  soft-tissue]
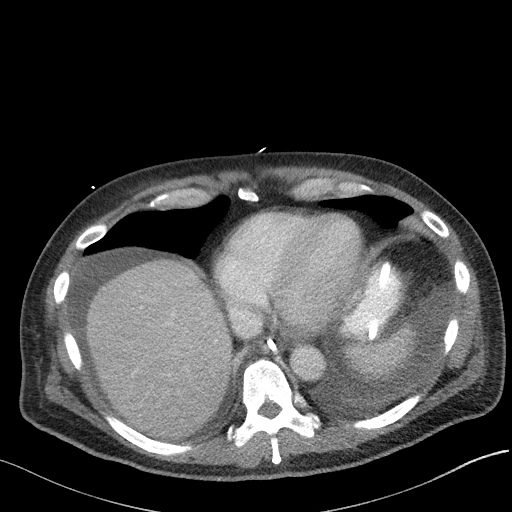
[im 95/101  soft-tissue]
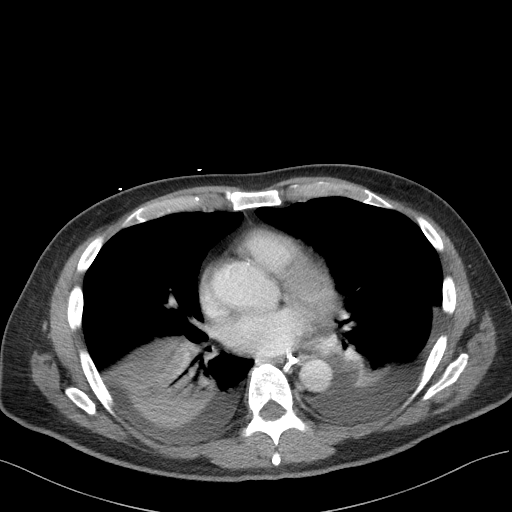

[Series 6: a/p w/ cor · coronal · 0.83mm/px · 3 of 151 slices shown]
[im 51/151  soft-tissue]
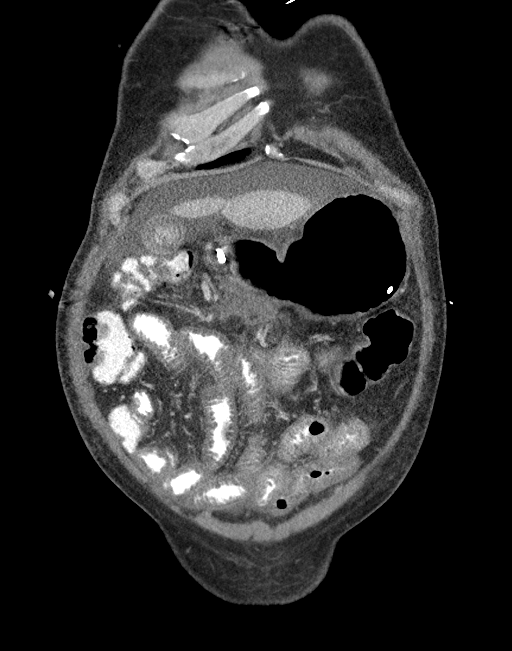
[im 67/151  soft-tissue]
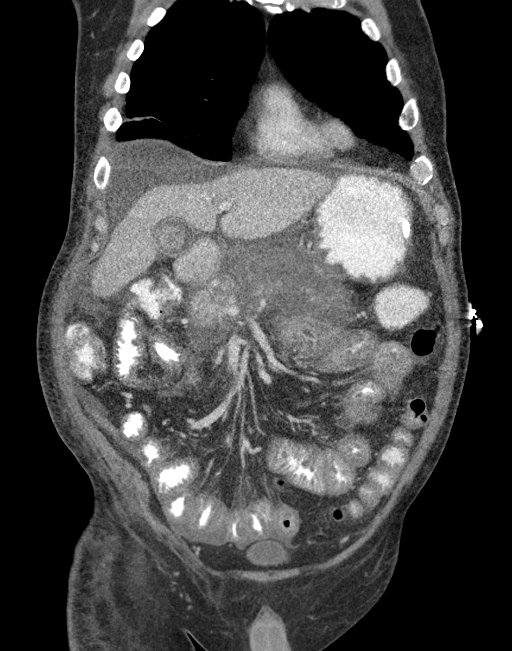
[im 84/151  soft-tissue]
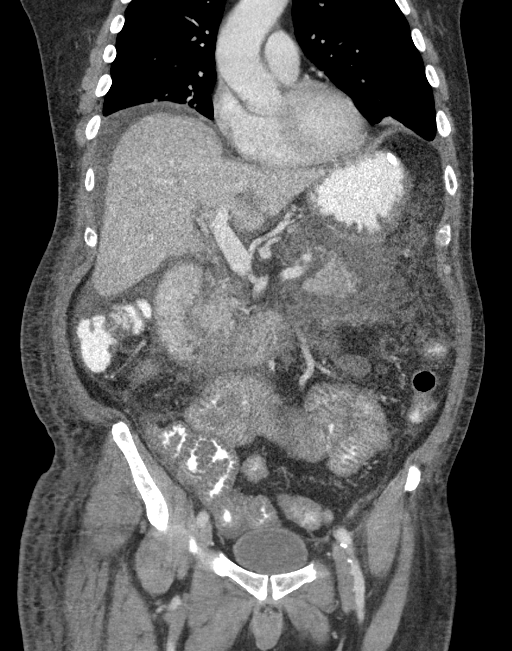

[15 of 46 positions shown; findings below may reference images not displayed]

FINDINGS: Lower chest: Small dependent bilateral pleural effusions, mildly
increased bilaterally. Dependent atelectasis at both lung bases.

Hepatobiliary: Normal liver size. No liver mass. Cholelithiasis. No
definite gallbladder wall thickening. No biliary ductal dilatation.
CBD diameter 5 mm.

Pancreas: There is absence of parenchymal enhancement throughout
much of the pancreatic body and portions of the pancreatic tail,
compatible with necrotizing pancreatitis, significantly worsened
since 03/16/2018 CT and similar to slightly worsened since
03/17/2018 MRI. Enhancement is preserved in the pancreatic head.
Diffuse peripancreatic fluid and fat stranding is similar. No
discrete pancreatic mass. No pancreatic duct dilation.

Spleen: Normal size. No mass.

Adrenals/Urinary Tract: Normal adrenals. No hydronephrosis.
Subcentimeter hypodense renal cortical lesions in the mid to lower
right kidney are too small to characterize and unchanged. Normal
bladder.

Stomach/Bowel: Enteric tube terminates in the distal stomach.
Stomach appears unremarkable. Diffuse moderate small bowel wall
thickening without significant small bowel dilatation, worsened from
prior CT. Normal appendix. Normal large bowel with no
diverticulosis, large bowel wall thickening or pericolonic fat
stranding.

Vascular/Lymphatic: Atherosclerotic nonaneurysmal abdominal aorta.
Patent portal, splenic, hepatic and renal veins. Enlarged 1.9 cm
porta hepatis node (series 3/image 34), increased from 1.4 cm. No
additional pathologically enlarged lymph nodes in the abdomen or
pelvis.

Reproductive: Top-normal size prostate with nonspecific internal
prostatic calcifications.

Other: No pneumoperitoneum. Small to moderate volume ascites,
predominantly perihepatic, increased. No focal measurable fluid
collection. Mild anasarca, new.

Musculoskeletal: No aggressive appearing focal osseous lesions.
Moderate thoracolumbar spondylosis.
IMPRESSION: 1. Necrotizing pancreatitis as detailed, similar to slightly
worsened since 03/17/2018 MRI, significantly worsened since
03/16/2018 CT.
2. Worsening third-spacing of fluid, with increased small to
moderate volume ascites, increased small dependent bilateral pleural
effusions, new anasarca and increased diffuse small bowel wall
thickening.
3. Mild porta hepatis adenopathy is increased and probably reactive.
4. Cholelithiasis.  No biliary ductal dilatation.

## 2019-08-17 NOTE — Telephone Encounter (Signed)
Called to discuss with patient my finalization of note from 1/12 appointment. We did not have an opportunity to discuss his hepatitis C and consideration of treatment. Spoke with the patient briefly this morning but he needs me to call him back later this afternoon or tomorrow. I will reach out to him again later this afternoon. Hepatitis C genotype future order will be placed.   Corliss Parish, MD [facility] Advanced Endoscopy Office # [phone]

## 2019-08-17 NOTE — Telephone Encounter (Signed)
I called and spoke with the patient again this afternoon. We discussed his hepatitis C diagnosis years ago and current clinical status. Although he is interested in treatment, he is worried that any of the treatments even though he knows they are better than 15 years ago, may get him to be pushed out of work if he were to develop severe fatigue or issues.  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 He is willing to come in for laboratories and wants to hold off on ultrasound with elastography for now. Once the laboratories returned including his viral serologies for hepatitis A and hepatitis B to see if he needs immunization or not as well as his genotype, we will decide and talk with him about what we think may be potential treatment regimens for him. We would then consider his liver ultrasound with elastography. If he agrees at that point we will then proceed with a infectious disease referral, he does not want that currently. Patient appreciative for the call back.  Rovonda,  Can you please make sure that his hepatitis A/hepatitis B serologies remain active they were future orders previously?  Can you also make sure that I have placed a hepatitis C genotype and that it is a future order for the patient?  He also needs a hepatitis C viral load RNA (this will need to be ordered). Thanks.  GM

## 2019-08-23 DIAGNOSIS — F321 Major depressive disorder, single episode, moderate: Secondary | ICD-10-CM | POA: Diagnosis not present

## 2019-08-23 DIAGNOSIS — E119 Type 2 diabetes mellitus without complications: Secondary | ICD-10-CM | POA: Diagnosis not present

## 2019-08-23 DIAGNOSIS — F411 Generalized anxiety disorder: Secondary | ICD-10-CM | POA: Diagnosis not present

## 2019-08-23 DIAGNOSIS — K219 Gastro-esophageal reflux disease without esophagitis: Secondary | ICD-10-CM | POA: Diagnosis not present

## 2019-08-23 DIAGNOSIS — I10 Essential (primary) hypertension: Secondary | ICD-10-CM | POA: Diagnosis not present

## 2019-08-23 DIAGNOSIS — E782 Mixed hyperlipidemia: Secondary | ICD-10-CM | POA: Diagnosis not present

## 2019-09-30 ENCOUNTER — Other Ambulatory Visit: Payer: Self-pay

## 2019-09-30 DIAGNOSIS — B182 Chronic viral hepatitis C: Secondary | ICD-10-CM

## 2019-09-30 DIAGNOSIS — Z8719 Personal history of other diseases of the digestive system: Secondary | ICD-10-CM

## 2019-09-30 NOTE — Telephone Encounter (Addendum)
FYI-Dr.Mansouraty I called Ryan Chan to follow-up on labs and offered an appointment, but pt states that he still does not wish to proceed with treatment or follow-up at this time therefore he does not wish to have labs done either. Pt states that he has a lot going on with work. I asked if he would like for me to check back with him at a later time, he states that he would like to call us back if he changes his mind.

## 2019-09-30 NOTE — Progress Notes (Signed)
he

## 2019-10-01 NOTE — Telephone Encounter (Signed)
Rovonda, Thank you for the update. It is certainly in his right to wait and call back. I would go ahead and place his lab orders as future order so that they are available that we can get them when he decides to come in. Thanks. GM

## 2019-11-21 DIAGNOSIS — Z130 Encounter for screening for diseases of the blood and blood-forming organs and certain disorders involving the immune mechanism: Secondary | ICD-10-CM | POA: Diagnosis not present

## 2019-11-21 DIAGNOSIS — R200 Anesthesia of skin: Secondary | ICD-10-CM | POA: Diagnosis not present

## 2019-11-21 DIAGNOSIS — N200 Calculus of kidney: Secondary | ICD-10-CM | POA: Diagnosis not present

## 2019-11-21 DIAGNOSIS — K219 Gastro-esophageal reflux disease without esophagitis: Secondary | ICD-10-CM | POA: Diagnosis not present

## 2019-11-21 DIAGNOSIS — I10 Essential (primary) hypertension: Secondary | ICD-10-CM | POA: Diagnosis not present

## 2019-11-21 DIAGNOSIS — E119 Type 2 diabetes mellitus without complications: Secondary | ICD-10-CM | POA: Diagnosis not present

## 2020-01-17 DIAGNOSIS — G4700 Insomnia, unspecified: Secondary | ICD-10-CM | POA: Diagnosis not present

## 2020-01-17 DIAGNOSIS — R935 Abnormal findings on diagnostic imaging of other abdominal regions, including retroperitoneum: Secondary | ICD-10-CM | POA: Diagnosis not present

## 2020-01-17 DIAGNOSIS — I10 Essential (primary) hypertension: Secondary | ICD-10-CM | POA: Diagnosis not present

## 2020-01-17 DIAGNOSIS — E0865 Diabetes mellitus due to underlying condition with hyperglycemia: Secondary | ICD-10-CM | POA: Diagnosis not present

## 2020-01-17 DIAGNOSIS — B182 Chronic viral hepatitis C: Secondary | ICD-10-CM | POA: Diagnosis not present

## 2020-01-17 DIAGNOSIS — Z8719 Personal history of other diseases of the digestive system: Secondary | ICD-10-CM | POA: Diagnosis not present

## 2020-01-17 DIAGNOSIS — Z794 Long term (current) use of insulin: Secondary | ICD-10-CM | POA: Diagnosis not present

## 2020-01-17 DIAGNOSIS — B356 Tinea cruris: Secondary | ICD-10-CM | POA: Diagnosis not present

## 2020-01-17 DIAGNOSIS — K219 Gastro-esophageal reflux disease without esophagitis: Secondary | ICD-10-CM | POA: Diagnosis not present

## 2020-01-17 DIAGNOSIS — K863 Pseudocyst of pancreas: Secondary | ICD-10-CM | POA: Diagnosis not present

## 2020-02-17 DIAGNOSIS — E782 Mixed hyperlipidemia: Secondary | ICD-10-CM | POA: Diagnosis not present

## 2020-02-17 DIAGNOSIS — R319 Hematuria, unspecified: Secondary | ICD-10-CM | POA: Diagnosis not present

## 2020-02-17 DIAGNOSIS — Z794 Long term (current) use of insulin: Secondary | ICD-10-CM | POA: Diagnosis not present

## 2020-02-17 DIAGNOSIS — I10 Essential (primary) hypertension: Secondary | ICD-10-CM | POA: Diagnosis not present

## 2020-02-17 DIAGNOSIS — E0865 Diabetes mellitus due to underlying condition with hyperglycemia: Secondary | ICD-10-CM | POA: Diagnosis not present

## 2020-02-17 DIAGNOSIS — Z87442 Personal history of urinary calculi: Secondary | ICD-10-CM | POA: Diagnosis not present

## 2020-02-17 DIAGNOSIS — K219 Gastro-esophageal reflux disease without esophagitis: Secondary | ICD-10-CM | POA: Diagnosis not present

## 2020-06-08 DIAGNOSIS — R058 Other specified cough: Secondary | ICD-10-CM | POA: Diagnosis not present

## 2020-06-15 DIAGNOSIS — E782 Mixed hyperlipidemia: Secondary | ICD-10-CM | POA: Diagnosis not present

## 2020-06-15 DIAGNOSIS — Z794 Long term (current) use of insulin: Secondary | ICD-10-CM | POA: Diagnosis not present

## 2020-06-15 DIAGNOSIS — E0865 Diabetes mellitus due to underlying condition with hyperglycemia: Secondary | ICD-10-CM | POA: Diagnosis not present

## 2020-06-15 DIAGNOSIS — G4700 Insomnia, unspecified: Secondary | ICD-10-CM | POA: Diagnosis not present

## 2020-06-15 DIAGNOSIS — E1165 Type 2 diabetes mellitus with hyperglycemia: Secondary | ICD-10-CM | POA: Diagnosis not present

## 2020-08-10 DIAGNOSIS — L570 Actinic keratosis: Secondary | ICD-10-CM | POA: Diagnosis not present

## 2020-08-10 DIAGNOSIS — L578 Other skin changes due to chronic exposure to nonionizing radiation: Secondary | ICD-10-CM | POA: Diagnosis not present

## 2020-08-10 DIAGNOSIS — L821 Other seborrheic keratosis: Secondary | ICD-10-CM | POA: Diagnosis not present

## 2020-09-14 DIAGNOSIS — G4700 Insomnia, unspecified: Secondary | ICD-10-CM | POA: Diagnosis not present

## 2020-09-14 DIAGNOSIS — K863 Pseudocyst of pancreas: Secondary | ICD-10-CM | POA: Diagnosis not present

## 2020-09-14 DIAGNOSIS — Z794 Long term (current) use of insulin: Secondary | ICD-10-CM | POA: Diagnosis not present

## 2020-09-14 DIAGNOSIS — E0865 Diabetes mellitus due to underlying condition with hyperglycemia: Secondary | ICD-10-CM | POA: Diagnosis not present

## 2020-09-14 DIAGNOSIS — B356 Tinea cruris: Secondary | ICD-10-CM | POA: Diagnosis not present

## 2020-09-14 DIAGNOSIS — Z720 Tobacco use: Secondary | ICD-10-CM | POA: Diagnosis not present

## 2020-09-19 DIAGNOSIS — Z87891 Personal history of nicotine dependence: Secondary | ICD-10-CM | POA: Diagnosis not present

## 2020-09-19 DIAGNOSIS — Z122 Encounter for screening for malignant neoplasm of respiratory organs: Secondary | ICD-10-CM | POA: Diagnosis not present

## 2020-09-19 DIAGNOSIS — F17210 Nicotine dependence, cigarettes, uncomplicated: Secondary | ICD-10-CM | POA: Diagnosis not present

## 2020-12-13 DIAGNOSIS — L304 Erythema intertrigo: Secondary | ICD-10-CM | POA: Diagnosis not present

## 2020-12-13 DIAGNOSIS — L578 Other skin changes due to chronic exposure to nonionizing radiation: Secondary | ICD-10-CM | POA: Diagnosis not present

## 2021-02-11 DIAGNOSIS — R509 Fever, unspecified: Secondary | ICD-10-CM | POA: Diagnosis not present

## 2021-02-11 DIAGNOSIS — E0865 Diabetes mellitus due to underlying condition with hyperglycemia: Secondary | ICD-10-CM | POA: Diagnosis not present

## 2021-02-11 DIAGNOSIS — E782 Mixed hyperlipidemia: Secondary | ICD-10-CM | POA: Diagnosis not present

## 2021-02-11 DIAGNOSIS — I10 Essential (primary) hypertension: Secondary | ICD-10-CM | POA: Diagnosis not present

## 2021-02-11 DIAGNOSIS — Z20822 Contact with and (suspected) exposure to covid-19: Secondary | ICD-10-CM | POA: Diagnosis not present

## 2021-02-11 DIAGNOSIS — Z125 Encounter for screening for malignant neoplasm of prostate: Secondary | ICD-10-CM | POA: Diagnosis not present

## 2021-02-11 DIAGNOSIS — N200 Calculus of kidney: Secondary | ICD-10-CM | POA: Diagnosis not present

## 2021-02-11 DIAGNOSIS — Z794 Long term (current) use of insulin: Secondary | ICD-10-CM | POA: Diagnosis not present

## 2021-02-14 DIAGNOSIS — R5381 Other malaise: Secondary | ICD-10-CM | POA: Diagnosis not present

## 2021-02-14 DIAGNOSIS — R300 Dysuria: Secondary | ICD-10-CM | POA: Diagnosis not present

## 2021-02-14 DIAGNOSIS — R509 Fever, unspecified: Secondary | ICD-10-CM | POA: Diagnosis not present

## 2021-02-14 DIAGNOSIS — N3091 Cystitis, unspecified with hematuria: Secondary | ICD-10-CM | POA: Diagnosis not present

## 2021-02-14 DIAGNOSIS — Z20828 Contact with and (suspected) exposure to other viral communicable diseases: Secondary | ICD-10-CM | POA: Diagnosis not present

## 2021-03-29 DIAGNOSIS — N3091 Cystitis, unspecified with hematuria: Secondary | ICD-10-CM | POA: Diagnosis not present

## 2021-03-29 DIAGNOSIS — N3001 Acute cystitis with hematuria: Secondary | ICD-10-CM | POA: Diagnosis not present

## 2021-03-29 DIAGNOSIS — N419 Inflammatory disease of prostate, unspecified: Secondary | ICD-10-CM | POA: Diagnosis not present

## 2021-04-02 DIAGNOSIS — N401 Enlarged prostate with lower urinary tract symptoms: Secondary | ICD-10-CM | POA: Diagnosis not present

## 2021-04-02 DIAGNOSIS — N200 Calculus of kidney: Secondary | ICD-10-CM | POA: Diagnosis not present

## 2021-04-22 DIAGNOSIS — N200 Calculus of kidney: Secondary | ICD-10-CM | POA: Diagnosis not present

## 2021-04-23 DIAGNOSIS — N401 Enlarged prostate with lower urinary tract symptoms: Secondary | ICD-10-CM | POA: Diagnosis not present

## 2021-04-23 DIAGNOSIS — N200 Calculus of kidney: Secondary | ICD-10-CM | POA: Diagnosis not present

## 2022-09-11 ENCOUNTER — Telehealth: Payer: Self-pay

## 2022-09-11 NOTE — Telephone Encounter (Signed)
Cologuard recall letter mailed to patient.
